# Patient Record
Sex: Female | Born: 1935 | Race: White | Hispanic: No | Smoking: Never smoker
Health system: Southern US, Community
[De-identification: ages and names within clinical notes are randomized; demographics above are authoritative.]

## PROBLEM LIST (undated history)

## (undated) DIAGNOSIS — I509 Heart failure, unspecified: Secondary | ICD-10-CM

## (undated) DIAGNOSIS — F32A Depression, unspecified: Secondary | ICD-10-CM

## (undated) DIAGNOSIS — J449 Chronic obstructive pulmonary disease, unspecified: Secondary | ICD-10-CM

## (undated) DIAGNOSIS — F329 Major depressive disorder, single episode, unspecified: Secondary | ICD-10-CM

## (undated) DIAGNOSIS — I10 Essential (primary) hypertension: Secondary | ICD-10-CM

## (undated) DIAGNOSIS — E119 Type 2 diabetes mellitus without complications: Secondary | ICD-10-CM

## (undated) DIAGNOSIS — M810 Age-related osteoporosis without current pathological fracture: Secondary | ICD-10-CM

## (undated) HISTORY — PX: CATARACT EXTRACTION: SUR2

## (undated) HISTORY — PX: US ECHOCARDIOGRAPHY: HXRAD669

---

## 1998-09-10 ENCOUNTER — Other Ambulatory Visit: Admission: RE | Admit: 1998-09-10 | Discharge: 1998-09-10 | Payer: Self-pay | Admitting: *Deleted

## 1999-05-09 ENCOUNTER — Encounter: Payer: Self-pay | Admitting: Vascular Surgery

## 1999-05-09 ENCOUNTER — Ambulatory Visit (HOSPITAL_COMMUNITY): Admission: RE | Admit: 1999-05-09 | Discharge: 1999-05-09 | Payer: Self-pay | Admitting: Vascular Surgery

## 1999-09-11 ENCOUNTER — Ambulatory Visit (HOSPITAL_BASED_OUTPATIENT_CLINIC_OR_DEPARTMENT_OTHER): Admission: RE | Admit: 1999-09-11 | Discharge: 1999-09-11 | Payer: Self-pay | Admitting: Orthopedic Surgery

## 1999-09-11 ENCOUNTER — Encounter (INDEPENDENT_AMBULATORY_CARE_PROVIDER_SITE_OTHER): Payer: Self-pay | Admitting: *Deleted

## 2000-12-09 ENCOUNTER — Other Ambulatory Visit: Admission: RE | Admit: 2000-12-09 | Discharge: 2000-12-09 | Payer: Self-pay | Admitting: *Deleted

## 2002-03-14 ENCOUNTER — Other Ambulatory Visit: Admission: RE | Admit: 2002-03-14 | Discharge: 2002-03-14 | Payer: Self-pay

## 2004-08-05 ENCOUNTER — Ambulatory Visit (HOSPITAL_COMMUNITY): Admission: RE | Admit: 2004-08-05 | Discharge: 2004-08-05 | Payer: Self-pay | Admitting: Gastroenterology

## 2004-10-02 ENCOUNTER — Other Ambulatory Visit: Admission: RE | Admit: 2004-10-02 | Discharge: 2004-10-02 | Payer: Self-pay | Admitting: Family Medicine

## 2008-10-13 ENCOUNTER — Encounter: Admission: RE | Admit: 2008-10-13 | Discharge: 2008-10-13 | Payer: Self-pay | Admitting: Family Medicine

## 2010-09-20 NOTE — Op Note (Signed)
[place]  Patient:    LORELL, THIBODAUX                       MRN: [id number] Proc. Date: 09/11/99 Adm. Date:  [id number] Disc. Date: [id number] Attending:  Schuyler Amor CC:         Sheral Apley Burney Gauze, M.D., [place] (2 copies)                           Operative Report  PREOPERATIVE DIAGNOSIS:  Occlusion of right renal artery.  POSTOPERATIVE DIAGNOSIS:  Occlusion of right renal artery.  OPERATION:  Resection of thrombosed portion of right renal artery with direct repair microscopically.  SURGEON:  Sheral Apley. Burney Gauze, M.D.  ASSISTANT:  Wynonia Sours, M.D.  ANESTHESIA:  General.  TOURNIQUET TIME:  One hour.  COMPLICATIONS:  None.  DRAINS:  None.  DESCRIPTION OF PROCEDURE:  The patient was taken to the operating room after the induction of adequate general anesthesia.  The right upper extremity was prepped and draped in the usual sterile fashion.  An Esmarch was used to exsanguinate the limb and the tourniquet was inflated to 250 mmHg.  At this time, a longitudinal incision was made starting 3 cm proximal to the distal forearm crease going along the topical border of the radial artery and then curving to the radial side up over the base of the thumb metacarpal into the area of the snuffbox.  The incision was taken down through the skin and subcutaneous tissues, and the radial artery was identified and the proximal part of the wound.  The radial artery was then traced underneath the first dorsal compartment into the area of the snuffbox where after careful dissection of the thrombosed area was seen just at the bifurcation of the radial artery proper and the ______ pollicis artery.  Careful dissection was taken out along the proximal part of the radial artery and dissection was performed.  The occluded part of the artery was then resected using microscopic scissors and the microscope was then brought onto the field. Under  microscopic magnification, the proximal and distal ends of the resected artery segments were dissected free of adventitia and intimal hypertrophy, and a vessel dilator was used to dilate the ends.  The proximal end had good back flow as well as the distal end.  A #2 Fogarty catheter was also placed within the proximal vessel, slightly inflated and withdrawn to fully dilate the vessel.  At this time a direct repair was undertaken microscopically using 9-0 nylon x 5 sutures.  After repair had been done, under no undo tension, the tourniquet was released and there was good flow across the anastomosed vessel site.  The wound was then thoroughly irrigated with ______ solution followed by normal saline and then closed with 4-0 nylon and a combination of simple and horizontal mattress sutures.  A sterile dressing, Xeroform, 4 x 4, Fluffs and a compressive dressing was applied as well as a radial gutter splint with the wrist in slight flexion.  The patient tolerated the procedure well, went to recovery room in a stable fashion. DD:  09/11/99 TD:  09/13/99 Job: [id number]

## 2010-09-20 NOTE — Op Note (Signed)
NAME:  Angel Estrada, Angel Estrada NO.:  [id number]   MEDICAL RECORD NO.:  [id number]          PATIENT TYPE:  AMB   LOCATION:  ENDO                         [facility]ACILITY[facility]  [facility]   PHYSICIAN:  John C. Amedeo Plenty, M.D.    DATE OF BIRTH:  Apr 21, 1934   DATE OF PROCEDURE:  08/04/2004  DATE OF DISCHARGE:                                 OPERATIVE REPORT   INDICATIONS FOR PROCEDURE:  Average risk colon cancer screening.   PROCEDURE:  The patient was placed in the left lateral decubitus position  and placed on the pulse monitor with continuous low-flow oxygen delivered by  nasal cannula. She was sedated with and 75 mcg IV fentanyl and 6 mg IV  Versed. The Olympus video colonoscope was inserted into the rectum and  advanced to cecum, confirmed by transillumination of McBurney's point and  visualization of ileocecal valve and appendiceal orifice. The prep was good.  The cecum, ascending and transverse colon all appeared normal with no  masses, polyps, diverticula or other mucosal abnormalities. Within the  descending and sigmoid colon, there was seen several scattered diverticula,  no other abnormalities. The rectum appeared normal. Retroflexed view of the  anus revealed no obvious internal hemorrhoids. The scope was then withdrawn  and the patient returned to the recovery room in stable condition. She  tolerated the procedure well. There were no immediate complications.   IMPRESSION:  Diverticulosis, otherwise normal study.   PLAN:  Next colonoscopy within 10 years.  Consider flexible sigmoidoscopy or  Hemoccults in 5 years.      [facility]/MEDQ  D:  08/05/2004  T:  08/05/2004  Job:  [id number]   cc:   Suszanne Conners, M.D.  [place]  [place] [address]  Fax: [phone]

## 2012-12-14 ENCOUNTER — Other Ambulatory Visit: Payer: Self-pay | Admitting: Dermatology

## 2013-05-27 ENCOUNTER — Telehealth (HOSPITAL_COMMUNITY): Payer: Self-pay | Admitting: *Deleted

## 2013-06-14 ENCOUNTER — Other Ambulatory Visit (HOSPITAL_COMMUNITY): Payer: Self-pay | Admitting: Internal Medicine

## 2013-06-14 DIAGNOSIS — R0602 Shortness of breath: Secondary | ICD-10-CM

## 2013-06-29 ENCOUNTER — Encounter (HOSPITAL_COMMUNITY): Payer: Self-pay

## 2013-07-13 ENCOUNTER — Ambulatory Visit (HOSPITAL_COMMUNITY)
Admission: RE | Admit: 2013-07-13 | Discharge: 2013-07-13 | Disposition: A | Discharge status: Home / Self Care | Payer: PRIVATE HEALTH INSURANCE | Source: Ambulatory Visit | Attending: Cardiovascular Disease | Admitting: Cardiovascular Disease

## 2013-07-13 DIAGNOSIS — R0602 Shortness of breath: Secondary | ICD-10-CM

## 2013-11-28 ENCOUNTER — Telehealth (HOSPITAL_COMMUNITY): Payer: Self-pay | Admitting: *Deleted

## 2013-12-21 ENCOUNTER — Telehealth (HOSPITAL_COMMUNITY): Payer: Self-pay | Admitting: *Deleted

## 2013-12-22 ENCOUNTER — Other Ambulatory Visit (HOSPITAL_COMMUNITY): Payer: Self-pay | Admitting: Internal Medicine

## 2013-12-22 DIAGNOSIS — I509 Heart failure, unspecified: Secondary | ICD-10-CM

## 2013-12-22 DIAGNOSIS — R609 Edema, unspecified: Secondary | ICD-10-CM

## 2013-12-23 ENCOUNTER — Ambulatory Visit (HOSPITAL_COMMUNITY)
Admission: RE | Admit: 2013-12-23 | Discharge: 2013-12-23 | Disposition: A | Discharge status: Home / Self Care | Payer: PRIVATE HEALTH INSURANCE | Source: Ambulatory Visit | Attending: Cardiovascular Disease | Admitting: Cardiovascular Disease

## 2013-12-23 DIAGNOSIS — I369 Nonrheumatic tricuspid valve disorder, unspecified: Secondary | ICD-10-CM

## 2013-12-23 DIAGNOSIS — I509 Heart failure, unspecified: Secondary | ICD-10-CM | POA: Insufficient documentation

## 2013-12-23 DIAGNOSIS — R609 Edema, unspecified: Secondary | ICD-10-CM

## 2013-12-23 NOTE — Progress Notes (Signed)
2D Echocardiogram Complete.  12/23/2013   Zamir Staples [place], RDCS

## 2015-07-30 ENCOUNTER — Inpatient Hospital Stay (HOSPITAL_COMMUNITY)
Admission: EM | Admit: 2015-07-30 | Discharge: 2015-08-04 | DRG: 602 | Disposition: A | Discharge status: Home / Self Care | Payer: Medicare Other | Attending: Internal Medicine | Admitting: Internal Medicine

## 2015-07-30 ENCOUNTER — Encounter (HOSPITAL_COMMUNITY): Payer: Self-pay | Admitting: Emergency Medicine

## 2015-07-30 ENCOUNTER — Emergency Department (HOSPITAL_COMMUNITY): Payer: Medicare Other

## 2015-07-30 DIAGNOSIS — Z880 Allergy status to penicillin: Secondary | ICD-10-CM

## 2015-07-30 DIAGNOSIS — I5033 Acute on chronic diastolic (congestive) heart failure: Secondary | ICD-10-CM | POA: Diagnosis present

## 2015-07-30 DIAGNOSIS — M7989 Other specified soft tissue disorders: Secondary | ICD-10-CM | POA: Diagnosis not present

## 2015-07-30 DIAGNOSIS — R600 Localized edema: Secondary | ICD-10-CM

## 2015-07-30 DIAGNOSIS — M810 Age-related osteoporosis without current pathological fracture: Secondary | ICD-10-CM | POA: Diagnosis present

## 2015-07-30 DIAGNOSIS — Z833 Family history of diabetes mellitus: Secondary | ICD-10-CM

## 2015-07-30 DIAGNOSIS — E119 Type 2 diabetes mellitus without complications: Secondary | ICD-10-CM

## 2015-07-30 DIAGNOSIS — I10 Essential (primary) hypertension: Secondary | ICD-10-CM | POA: Diagnosis present

## 2015-07-30 DIAGNOSIS — K449 Diaphragmatic hernia without obstruction or gangrene: Secondary | ICD-10-CM | POA: Diagnosis present

## 2015-07-30 DIAGNOSIS — Z8249 Family history of ischemic heart disease and other diseases of the circulatory system: Secondary | ICD-10-CM

## 2015-07-30 DIAGNOSIS — Z882 Allergy status to sulfonamides status: Secondary | ICD-10-CM

## 2015-07-30 DIAGNOSIS — I509 Heart failure, unspecified: Secondary | ICD-10-CM

## 2015-07-30 DIAGNOSIS — I130 Hypertensive heart and chronic kidney disease with heart failure and stage 1 through stage 4 chronic kidney disease, or unspecified chronic kidney disease: Secondary | ICD-10-CM | POA: Diagnosis present

## 2015-07-30 DIAGNOSIS — J449 Chronic obstructive pulmonary disease, unspecified: Secondary | ICD-10-CM | POA: Diagnosis present

## 2015-07-30 DIAGNOSIS — L03116 Cellulitis of left lower limb: Secondary | ICD-10-CM | POA: Diagnosis not present

## 2015-07-30 DIAGNOSIS — I5030 Unspecified diastolic (congestive) heart failure: Secondary | ICD-10-CM

## 2015-07-30 DIAGNOSIS — F329 Major depressive disorder, single episode, unspecified: Secondary | ICD-10-CM | POA: Diagnosis present

## 2015-07-30 DIAGNOSIS — Z808 Family history of malignant neoplasm of other organs or systems: Secondary | ICD-10-CM

## 2015-07-30 DIAGNOSIS — Z888 Allergy status to other drugs, medicaments and biological substances status: Secondary | ICD-10-CM

## 2015-07-30 DIAGNOSIS — T502X5A Adverse effect of carbonic-anhydrase inhibitors, benzothiadiazides and other diuretics, initial encounter: Secondary | ICD-10-CM | POA: Diagnosis present

## 2015-07-30 DIAGNOSIS — N183 Chronic kidney disease, stage 3 (moderate): Secondary | ICD-10-CM | POA: Diagnosis present

## 2015-07-30 DIAGNOSIS — Z79899 Other long term (current) drug therapy: Secondary | ICD-10-CM

## 2015-07-30 DIAGNOSIS — R944 Abnormal results of kidney function studies: Secondary | ICD-10-CM | POA: Diagnosis present

## 2015-07-30 DIAGNOSIS — Z823 Family history of stroke: Secondary | ICD-10-CM

## 2015-07-30 DIAGNOSIS — I248 Other forms of acute ischemic heart disease: Secondary | ICD-10-CM | POA: Diagnosis present

## 2015-07-30 DIAGNOSIS — E1122 Type 2 diabetes mellitus with diabetic chronic kidney disease: Secondary | ICD-10-CM | POA: Diagnosis present

## 2015-07-30 DIAGNOSIS — Z91040 Latex allergy status: Secondary | ICD-10-CM

## 2015-07-30 HISTORY — DX: Chronic obstructive pulmonary disease, unspecified: J44.9

## 2015-07-30 HISTORY — DX: Depression, unspecified: F32.A

## 2015-07-30 HISTORY — DX: Heart failure, unspecified: I50.9

## 2015-07-30 HISTORY — DX: Major depressive disorder, single episode, unspecified: F32.9

## 2015-07-30 HISTORY — DX: Type 2 diabetes mellitus without complications: E11.9

## 2015-07-30 HISTORY — DX: Essential (primary) hypertension: I10

## 2015-07-30 HISTORY — DX: Age-related osteoporosis without current pathological fracture: M81.0

## 2015-07-30 LAB — CBC WITH DIFFERENTIAL/PLATELET
Basophils Absolute: 0 10*3/uL (ref 0.0–0.1)
Basophils Relative: 0 %
Eosinophils Absolute: 0.1 10*3/uL (ref 0.0–0.7)
Eosinophils Relative: 1 %
HEMATOCRIT: 46.8 % — AB (ref 36.0–46.0)
HEMOGLOBIN: 15 g/dL (ref 12.0–15.0)
Lymphocytes Relative: 11 %
Lymphs Abs: 1 10*3/uL (ref 0.7–4.0)
MCH: 30.6 pg (ref 26.0–34.0)
MCHC: 32.1 g/dL (ref 30.0–36.0)
MCV: 95.5 fL (ref 78.0–100.0)
MONOS PCT: 12 %
Monocytes Absolute: 1.1 10*3/uL — ABNORMAL HIGH (ref 0.1–1.0)
NEUTROS ABS: 6.9 10*3/uL (ref 1.7–7.7)
NEUTROS PCT: 76 %
Platelets: 276 10*3/uL (ref 150–400)
RBC: 4.9 MIL/uL (ref 3.87–5.11)
RDW: 14.4 % (ref 11.5–15.5)
WBC: 9.1 10*3/uL (ref 4.0–10.5)

## 2015-07-30 LAB — COMPREHENSIVE METABOLIC PANEL
ALBUMIN: 3.8 g/dL (ref 3.5–5.0)
ALT: 23 U/L (ref 14–54)
AST: 31 U/L (ref 15–41)
Alkaline Phosphatase: 86 U/L (ref 38–126)
Anion gap: 14 (ref 5–15)
BUN: 41 mg/dL — ABNORMAL HIGH (ref 6–20)
CO2: 32 mmol/L (ref 22–32)
Calcium: 9.3 mg/dL (ref 8.9–10.3)
Chloride: 94 mmol/L — ABNORMAL LOW (ref 101–111)
Creatinine, Ser: 1.47 mg/dL — ABNORMAL HIGH (ref 0.44–1.00)
GFR calc Af Amer: 38 mL/min — ABNORMAL LOW (ref >60)
GFR calc non Af Amer: 33 mL/min — ABNORMAL LOW (ref >60)
GLUCOSE: 94 mg/dL (ref 65–99)
POTASSIUM: 3.7 mmol/L (ref 3.5–5.1)
SODIUM: 140 mmol/L (ref 135–145)
TOTAL PROTEIN: 7 g/dL (ref 6.5–8.1)
Total Bilirubin: 1.5 mg/dL — ABNORMAL HIGH (ref 0.3–1.2)

## 2015-07-30 LAB — I-STAT CG4 LACTIC ACID, ED
Lactic Acid, Venous: 1.24 mmol/L (ref 0.5–2.0)
Lactic Acid, Venous: 0.88 mmol/L (ref 0.5–2.0)

## 2015-07-30 LAB — PROTIME-INR
INR: 1.19 (ref 0.00–1.49)
Prothrombin Time: 14.8 seconds (ref 11.6–15.2)

## 2015-07-30 LAB — APTT: aPTT: 27 seconds (ref 24–37)

## 2015-07-30 LAB — BRAIN NATRIURETIC PEPTIDE: B Natriuretic Peptide: 764.5 pg/mL — ABNORMAL HIGH (ref 0.0–100.0)

## 2015-07-30 MED ORDER — FUROSEMIDE 10 MG/ML IJ SOLN
60.0000 mg | Freq: Once | INTRAMUSCULAR | Status: AC
  Administered 2015-07-31: 60 mg via INTRAVENOUS
  Filled 2015-07-30: qty 8

## 2015-07-30 NOTE — ED Provider Notes (Signed)
CSN: [id number]     Arrival date & time 07/30/15  1909 History   First MD Initiated Contact with Patient 07/30/15 2010     Chief Complaint  Patient presents with  . Cellulitis     (Consider location/radiation/quality/duration/timing/severity/associated sxs/prior Treatment) HPI Comments: Patient here after being seen by her physician today for pain to her bilateral lower extremities and diagnosed with cellulitis. She however denied any fever or chills or myalgias. Does have a history of recurrent cellulitis. She is notes slight dyspnea but denies any chest pain or cough. Does not use home oxygen. Pulse oximetry was 80% which was treated with 2 L of oxygen. That was performed by EMS. She has also noted some numbness to the left foot as well as discoloration. Symptoms have been present for over 24 hours and no treatment for them use prior to arrival. Denies any prior history of arterial obstruction  The history is provided by the patient.    Past Medical History  Diagnosis Date  . CHF (congestive heart failure) ([place])   . Diabetes mellitus without complication ([place])   . Hypertension   . COPD (chronic obstructive pulmonary disease) ([place])   . Depression   . Osteoporosis    Past Surgical History  Procedure Laterality Date  . US echocardiography    . Cataract extraction Bilateral    No family history on file. Social History  Substance Use Topics  . Smoking status: Never Smoker   . Smokeless tobacco: None  . Alcohol Use: No   OB History    No data available     Review of Systems  All other systems reviewed and are negative.     Allergies  Lisinopril-hydrochlorothiazide; Sulfa antibiotics; Latex; and Penicillins  Home Medications   Prior to Admission medications   Medication Sig Start Date End Date Taking? Authorizing Provider  citalopram (CELEXA) 40 MG tablet Take 40 mg by mouth daily. 07/13/15  Yes Historical Provider, MD  fluticasone (FLONASE) 50 MCG/ACT nasal spray  Place 2 sprays into both nostrils daily.   Yes Historical Provider, MD  furosemide (LASIX) 40 MG tablet Take 40-60 mg by mouth daily. 60 mg every morning and 40 mg every afternoon 07/09/15  Yes Historical Provider, MD  nystatin-triamcinolone (MYCOLOG II) cream Apply 1 application topically 2 (two) times daily as needed (rash). Reported on 07/30/2015 07/02/15  Yes Historical Provider, MD  PROAIR HFA 108 (90 Base) MCG/ACT inhaler Inhale 2 puffs into the lungs every 6 (six) hours as needed for wheezing or shortness of breath.  07/02/15  Yes Historical Provider, MD  triamcinolone cream (KENALOG) 0.1 % Apply 1 application topically 2 (two) times daily as needed (skin irritation).   Yes Historical Provider, MD   BP 122/89 mmHg  Pulse 93  Temp(Src) 98.4 F (36.9 C) (Oral)  Resp 15  SpO2 96% Physical Exam  Constitutional: She is oriented to person, place, and time. She appears well-developed and well-nourished.  Non-toxic appearance. No distress.  HENT:  Head: Normocephalic and atraumatic.  Eyes: Conjunctivae, EOM and lids are normal. Pupils are equal, round, and reactive to light.  Neck: Normal range of motion. Neck supple. No tracheal deviation present. No thyroid mass present.  Cardiovascular: Normal rate, regular rhythm and normal heart sounds.  Exam reveals no gallop.   No murmur heard. Pulmonary/Chest: Effort normal. No stridor. No respiratory distress. She has decreased breath sounds. She has rhonchi. She has no rales.  Abdominal: Soft. Normal appearance and bowel sounds are normal. She exhibits no  distension. There is no tenderness. There is no rebound and no CVA tenderness.  Musculoskeletal: Normal range of motion. She exhibits no edema or tenderness.       Feet:  Bilateral lower extremity discoloration with hyperpigmentation. Right lower extremity with 3+ pitting edema compared with the left which has 1+ pitting edema. Cap refill less than 3 seconds on the left foot. Slight paresthesias per  patient at the sole of the left foot but has normal function of both feet.  Neurological: She is alert and oriented to person, place, and time. She has normal strength. No cranial nerve deficit or sensory deficit. GCS eye subscore is 4. GCS verbal subscore is 5. GCS motor subscore is 6.  Skin: Skin is warm and dry. No abrasion and no rash noted.  Psychiatric: She has a normal mood and affect. Her speech is normal and behavior is normal.  Nursing note and vitals reviewed.   ED Course  Procedures (including critical care time) Labs Review Labs Reviewed  CULTURE, BLOOD (ROUTINE X 2)  CULTURE, BLOOD (ROUTINE X 2)  URINE CULTURE  COMPREHENSIVE METABOLIC PANEL  CBC WITH DIFFERENTIAL/PLATELET  URINALYSIS, ROUTINE W REFLEX MICROSCOPIC (NOT AT [facility])  PROTIME-INR  APTT  BRAIN NATRIURETIC PEPTIDE  I-STAT CG4 LACTIC ACID, ED    Imaging Review Dg Chest 2 View  07/30/2015  CLINICAL DATA:  82 year old female with hypoxia. Cellulitis of the lower extremities. EXAM: CHEST  2 VIEW COMPARISON:  None. FINDINGS: Two views of the chest demonstrate large focal area of opacity at the left lung base which appears to contain bowel gas and most likely represents large hiatal or diaphragmatic hernia. There is associated compressive atelectasis of the left lung base. Pneumonia is not excluded. The right lung is clear. There is emphysematous changes of the spine. Trace right pleural effusion noted. There is silhouetting of the left cardiac border. There is thoracolumbar scoliosis. No acute fracture. IMPRESSION: Large bowel containing hernia in the left lung base. Underlying infiltrate is not excluded. Electronically Signed   By: Anner Crete M.D.   On: 07/30/2015 20:32   I have personally reviewed and evaluated these images and lab results as part of my medical decision-making.   EKG Interpretation   Date/Time:  Monday July 30 2015 19:40:29 EDT Ventricular Rate:  100 PR Interval:  193 QRS Duration: 97 QT  Interval:  346 QTC Calculation: 446 R Axis:   -155 Text Interpretation:  Sinus tachycardia Multiple premature complexes, vent  & supraven Low voltage, extremity and precordial leads Consider right  ventricular hypertrophy Baseline wander in lead(s) V5 Confirmed by Zareena Willis   MD, Thersea Manfredonia ([id number]) on 07/30/2015 8:37:00 PM      MDM   Final diagnoses:  None    Patient given Lasix for suspected CHF. Will Be admitted to the hospitalist service      Lacretia Leigh, MD 07/30/15 2355

## 2015-07-30 NOTE — ED Notes (Signed)
Pt transported via EMS from [facility] for IV antibiotics to treat bilat cellulitis of lower extremities. Pt initial sat 80% on RA, pt was ambulatory from ambulance to room, pt states she is not one home 02 and has been increasingly SHOB, especially with exertion.

## 2015-07-31 ENCOUNTER — Inpatient Hospital Stay (HOSPITAL_COMMUNITY): Payer: Medicare Other

## 2015-07-31 ENCOUNTER — Encounter (HOSPITAL_COMMUNITY): Payer: Self-pay | Admitting: Internal Medicine

## 2015-07-31 DIAGNOSIS — M810 Age-related osteoporosis without current pathological fracture: Secondary | ICD-10-CM | POA: Diagnosis present

## 2015-07-31 DIAGNOSIS — Z8249 Family history of ischemic heart disease and other diseases of the circulatory system: Secondary | ICD-10-CM | POA: Diagnosis not present

## 2015-07-31 DIAGNOSIS — L03116 Cellulitis of left lower limb: Secondary | ICD-10-CM | POA: Diagnosis present

## 2015-07-31 DIAGNOSIS — Z808 Family history of malignant neoplasm of other organs or systems: Secondary | ICD-10-CM | POA: Diagnosis not present

## 2015-07-31 DIAGNOSIS — Z882 Allergy status to sulfonamides status: Secondary | ICD-10-CM | POA: Diagnosis not present

## 2015-07-31 DIAGNOSIS — F329 Major depressive disorder, single episode, unspecified: Secondary | ICD-10-CM | POA: Diagnosis present

## 2015-07-31 DIAGNOSIS — I509 Heart failure, unspecified: Secondary | ICD-10-CM

## 2015-07-31 DIAGNOSIS — L03115 Cellulitis of right lower limb: Secondary | ICD-10-CM | POA: Diagnosis not present

## 2015-07-31 DIAGNOSIS — R944 Abnormal results of kidney function studies: Secondary | ICD-10-CM | POA: Diagnosis present

## 2015-07-31 DIAGNOSIS — T502X5A Adverse effect of carbonic-anhydrase inhibitors, benzothiadiazides and other diuretics, initial encounter: Secondary | ICD-10-CM | POA: Diagnosis present

## 2015-07-31 DIAGNOSIS — Z79899 Other long term (current) drug therapy: Secondary | ICD-10-CM | POA: Diagnosis not present

## 2015-07-31 DIAGNOSIS — M7989 Other specified soft tissue disorders: Secondary | ICD-10-CM | POA: Diagnosis present

## 2015-07-31 DIAGNOSIS — Z833 Family history of diabetes mellitus: Secondary | ICD-10-CM | POA: Diagnosis not present

## 2015-07-31 DIAGNOSIS — Z880 Allergy status to penicillin: Secondary | ICD-10-CM | POA: Diagnosis not present

## 2015-07-31 DIAGNOSIS — I10 Essential (primary) hypertension: Secondary | ICD-10-CM | POA: Diagnosis not present

## 2015-07-31 DIAGNOSIS — Z888 Allergy status to other drugs, medicaments and biological substances status: Secondary | ICD-10-CM | POA: Diagnosis not present

## 2015-07-31 DIAGNOSIS — Z823 Family history of stroke: Secondary | ICD-10-CM | POA: Diagnosis not present

## 2015-07-31 DIAGNOSIS — K449 Diaphragmatic hernia without obstruction or gangrene: Secondary | ICD-10-CM | POA: Diagnosis present

## 2015-07-31 DIAGNOSIS — I248 Other forms of acute ischemic heart disease: Secondary | ICD-10-CM | POA: Diagnosis present

## 2015-07-31 DIAGNOSIS — R600 Localized edema: Secondary | ICD-10-CM

## 2015-07-31 DIAGNOSIS — I5033 Acute on chronic diastolic (congestive) heart failure: Secondary | ICD-10-CM | POA: Diagnosis present

## 2015-07-31 DIAGNOSIS — I130 Hypertensive heart and chronic kidney disease with heart failure and stage 1 through stage 4 chronic kidney disease, or unspecified chronic kidney disease: Secondary | ICD-10-CM | POA: Diagnosis present

## 2015-07-31 DIAGNOSIS — N183 Chronic kidney disease, stage 3 (moderate): Secondary | ICD-10-CM | POA: Diagnosis present

## 2015-07-31 DIAGNOSIS — J42 Unspecified chronic bronchitis: Secondary | ICD-10-CM | POA: Diagnosis not present

## 2015-07-31 DIAGNOSIS — E1122 Type 2 diabetes mellitus with diabetic chronic kidney disease: Secondary | ICD-10-CM | POA: Diagnosis present

## 2015-07-31 DIAGNOSIS — E119 Type 2 diabetes mellitus without complications: Secondary | ICD-10-CM | POA: Diagnosis not present

## 2015-07-31 DIAGNOSIS — Z91040 Latex allergy status: Secondary | ICD-10-CM | POA: Diagnosis not present

## 2015-07-31 DIAGNOSIS — J449 Chronic obstructive pulmonary disease, unspecified: Secondary | ICD-10-CM | POA: Diagnosis present

## 2015-07-31 LAB — URINALYSIS, ROUTINE W REFLEX MICROSCOPIC
Bilirubin Urine: NEGATIVE
Glucose, UA: NEGATIVE mg/dL
HGB URINE DIPSTICK: NEGATIVE
Ketones, ur: NEGATIVE mg/dL
Nitrite: NEGATIVE
PH: 7 (ref 5.0–8.0)
Protein, ur: NEGATIVE mg/dL
Specific Gravity, Urine: 1.009 (ref 1.005–1.030)

## 2015-07-31 LAB — ECHOCARDIOGRAM COMPLETE
HEIGHTINCHES: 64 in
WEIGHTICAEL: 1956.8 [oz_av]

## 2015-07-31 LAB — TROPONIN I
TROPONIN I: 0.04 ng/mL — AB (ref <0.031)
Troponin I: 0.04 ng/mL — ABNORMAL HIGH (ref <0.031)
Troponin I: 0.04 ng/mL — ABNORMAL HIGH (ref <0.031)
Troponin I: 0.04 ng/mL — ABNORMAL HIGH (ref <0.031)

## 2015-07-31 LAB — URINE MICROSCOPIC-ADD ON: RBC / HPF: NONE SEEN RBC/hpf (ref 0–5)

## 2015-07-31 MED ORDER — ACETAMINOPHEN 325 MG PO TABS: 650.0000 mg | ORAL_TABLET | ORAL | Status: DC | PRN

## 2015-07-31 MED ORDER — SODIUM CHLORIDE 0.9% FLUSH
3.0000 mL | Freq: Two times a day (BID) | INTRAVENOUS | Status: DC
  Administered 2015-07-31 – 2015-08-03 (×8): 3 mL via INTRAVENOUS

## 2015-07-31 MED ORDER — IPRATROPIUM-ALBUTEROL 0.5-2.5 (3) MG/3ML IN SOLN
3.0000 mL | Freq: Three times a day (TID) | RESPIRATORY_TRACT | Status: DC
  Administered 2015-07-31 – 2015-08-01 (×4): 3 mL via RESPIRATORY_TRACT
  Filled 2015-07-31 (×4): qty 3

## 2015-07-31 MED ORDER — ASPIRIN EC 81 MG PO TBEC
81.0000 mg | DELAYED_RELEASE_TABLET | Freq: Every day | ORAL | Status: DC
  Administered 2015-07-31 – 2015-08-04 (×5): 81 mg via ORAL
  Filled 2015-07-31 (×5): qty 1

## 2015-07-31 MED ORDER — IPRATROPIUM BROMIDE 0.02 % IN SOLN
0.5000 mg | Freq: Four times a day (QID) | RESPIRATORY_TRACT | Status: DC
  Administered 2015-07-31: 0.5 mg via RESPIRATORY_TRACT
  Filled 2015-07-31: qty 2.5

## 2015-07-31 MED ORDER — POTASSIUM CHLORIDE CRYS ER 20 MEQ PO TBCR
20.0000 meq | EXTENDED_RELEASE_TABLET | Freq: Two times a day (BID) | ORAL | Status: DC
  Administered 2015-07-31 – 2015-08-02 (×7): 20 meq via ORAL
  Filled 2015-07-31 (×8): qty 1

## 2015-07-31 MED ORDER — ALBUTEROL SULFATE (2.5 MG/3ML) 0.083% IN NEBU: 2.5000 mg | INHALATION_SOLUTION | Freq: Four times a day (QID) | RESPIRATORY_TRACT | Status: DC | PRN

## 2015-07-31 MED ORDER — ENOXAPARIN SODIUM 30 MG/0.3ML ~~LOC~~ SOLN
30.0000 mg | SUBCUTANEOUS | Status: DC
  Administered 2015-07-31 – 2015-08-01 (×2): 30 mg via SUBCUTANEOUS
  Filled 2015-07-31 (×3): qty 0.3

## 2015-07-31 MED ORDER — ONDANSETRON HCL 4 MG/2ML IJ SOLN: 4.0000 mg | Freq: Four times a day (QID) | INTRAMUSCULAR | Status: DC | PRN

## 2015-07-31 MED ORDER — ALBUTEROL SULFATE HFA 108 (90 BASE) MCG/ACT IN AERS: 2.0000 | INHALATION_SPRAY | Freq: Four times a day (QID) | RESPIRATORY_TRACT | Status: DC | PRN

## 2015-07-31 MED ORDER — FUROSEMIDE 10 MG/ML IJ SOLN
40.0000 mg | Freq: Two times a day (BID) | INTRAMUSCULAR | Status: DC
  Administered 2015-07-31 – 2015-08-03 (×7): 40 mg via INTRAVENOUS
  Filled 2015-07-31 (×7): qty 4

## 2015-07-31 MED ORDER — SODIUM CHLORIDE 0.9% FLUSH: 3.0000 mL | INTRAVENOUS | Status: DC | PRN

## 2015-07-31 MED ORDER — CITALOPRAM HYDROBROMIDE 40 MG PO TABS
40.0000 mg | ORAL_TABLET | Freq: Every day | ORAL | Status: DC
  Administered 2015-07-31 – 2015-08-04 (×5): 40 mg via ORAL
  Filled 2015-07-31 (×5): qty 1

## 2015-07-31 MED ORDER — ALBUTEROL SULFATE (2.5 MG/3ML) 0.083% IN NEBU: 2.5000 mg | INHALATION_SOLUTION | RESPIRATORY_TRACT | Status: DC | PRN

## 2015-07-31 MED ORDER — VANCOMYCIN HCL 500 MG IV SOLR
500.0000 mg | Freq: Every day | INTRAVENOUS | Status: DC
  Administered 2015-07-31 – 2015-08-02 (×4): 500 mg via INTRAVENOUS
  Filled 2015-07-31 (×4): qty 500

## 2015-07-31 MED ORDER — SODIUM CHLORIDE 0.9 % IV SOLN: 250.0000 mL | INTRAVENOUS | Status: DC | PRN

## 2015-07-31 NOTE — Progress Notes (Signed)
  Echocardiogram 2D Echocardiogram has been performed.  Angel Estrada 07/31/2015, 1:10 PM

## 2015-07-31 NOTE — Care Management Note (Signed)
Case Management Note  Patient Details  Name: Angel Estrada MRN: [id number] Date of Birth: Jul 06, 1933  Subjective/Objective:   82 y/o f admitted w/CHF, bilat lower leg cellulitis. From home.                 Action/Plan:d/c plan home.   Expected Discharge Date:                  Expected Discharge Plan:  Home/Self Care  In-House Referral:     Discharge planning Services  CM Consult  Post Acute Care Choice:    Choice offered to:     DME Arranged:    DME Agency:     HH Arranged:    HH Agency:     Status of Service:  In process, will continue to follow  Medicare Important Message Given:    Date Medicare IM Given:    Medicare IM give by:    Date Additional Medicare IM Given:    Additional Medicare Important Message give by:     If discussed at [place] of Stay Meetings, dates discussed:    Additional Comments:  Dessa Phi, RN 07/31/2015, 3:05 PM

## 2015-07-31 NOTE — Progress Notes (Signed)
PROGRESS NOTE    Angel Estrada  FMB:[id number]  DOB: 12/11/1933  DOA: 07/30/2015 PCP: Thressa Sheller, MD Outpatient Specialists: Cardiology:   Hospital course: 82 year old female patient with history of chronic diastolic CHF, DM 2, HTN, COPD, depression, sent from PCPs office to ED due to complaints of worsening leg swelling left >right associated with worsening redness of left leg, some increased dyspnea but no chest pain and weeping left leg edema. Reports weight gain of 10 pounds recently. Admitted for presumed cellulitis of left leg and decompensated CHF.   Assessment & Plan:   Cellulitis of left leg - Likely complicating chronic leg edema/? Venous stasis - Treating empirically with IV vancomycin. Improving. Continue same for additional 24 hours then consider transitioning to oral antibiotics. - Bilateral lower extremity venous Dopplers: No DVT.  Acute on chronic diastolic CHF - Diuresed with IV Lasix. -1.2 L since admission. Improving. Continue management. - Minimally elevated troponin and flat trend, likely secondary to demand ischemia from CHF and infection. No chest pain reported. - Follow repeat 2-D echo.  COPD - Stable.  Essential hypertension - Controlled. Apparently allergic to ACEI  Diet-controlled DM 2 - Monitor CBGs.  Renal insufficiency/possible stage III chronic kidney disease - Baseline creatinine not known. Admitted with creatinine of 1.47. Follow BMP in a.m.  Large hiatal hernia or diaphragmatic hernia - Noted on chest x-ray. Relatively asymptomatic.    DVT prophylaxis: Lovenox Code Status: Full Family Communication: None at bedside Disposition Plan: DC home when medically stable, possibly in 1-2 days.   Consultants:  None  Procedures:  Bilateral lower extremity venous duplex completed.   Bilateral: No evidence of DVT, superficial thrombosis, or Baker's Cyst.  Antimicrobials:  None   Subjective: Feels much better. Improved  left leg swelling and redness. Not weeping anymore. Dyspnea improved. Denies dyspnea, cough or chest pain.  Objective: Filed Vitals:   07/31/15 0249 07/31/15 0525 07/31/15 1359 07/31/15 1403  BP: 137/87 113/74  104/76  Pulse: 101 94  92  Temp: 97.8 F (36.6 C) 98.1 F (36.7 C)  97.2 F (36.2 C)  TempSrc: Oral Oral  Oral  Resp: _0 Height: _1  (1.626 m)     Weight: 55.475 kg (122 lb 4.8 oz) 55.475 kg (122 lb 4.8 oz)    SpO2: 99% 96% 98% 100%    Intake/Output Summary (Last 24 hours) at 07/31/15 1503 Last data filed at 07/31/15 0840  Gross per 24 hour  Intake      0 ml  Output   1200 ml  Net  -1200 ml   Filed Weights   07/31/15 0249 07/31/15 0525  Weight: 55.475 kg (122 lb 4.8 oz) 55.475 kg (122 lb 4.8 oz)    Exam:  General exam: Small built and nourished pleasant elderly female sitting up comfortably in the chair this morning. Respiratory system: Occasional basal crackles but otherwise clear to auscultation. No increased work of breathing. Cardiovascular system: S1 & S2 heard, RRR. No JVD, murmurs, gallops, clicks. Trace bilateral leg edema, left greater than signed right. Telemetry: SR-ST in the 100s. Gastrointestinal system: Abdomen is nondistended, soft and nontender. Normal bowel sounds heard. Central nervous system: Alert and oriented. No focal neurological deficits. Extremities: Symmetric 5 x 5 power. Left lower extremity below mid leg up to the toes with diffuse erythema, minimal increased warmth but no tenderness or weeping and trace edema-looks improved compared to initial picture taken on admission. Right leg with patchy redness which may be chronic.   Data  Reviewed: Basic Metabolic Panel:  Recent Labs Lab 07/30/15 2116  NA 140  K 3.7  CL 94*  CO2 32  GLUCOSE 94  BUN 41*  CREATININE 1.47*  CALCIUM 9.3   Liver Function Tests:  Recent Labs Lab 07/30/15 2116  AST 31  ALT 23  ALKPHOS 86  BILITOT 1.5*  PROT 7.0  ALBUMIN 3.8   No results  for input(s): LIPASE, AMYLASE in the last 168 hours. No results for input(s): AMMONIA in the last 168 hours. CBC:  Recent Labs Lab 07/30/15 2200  WBC 9.1  NEUTROABS 6.9  HGB 15.0  HCT 46.8*  MCV 95.5  PLT 276   Cardiac Enzymes:  Recent Labs Lab 07/30/15 2314 07/31/15 0302 07/31/15 0834  TROPONINI 0.04* 0.04* 0.04*   BNP (last 3 results) No results for input(s): PROBNP in the last 8760 hours. CBG: No results for input(s): GLUCAP in the last 168 hours.  No results found for this or any previous visit (from the past 240 hour(s)).       Studies: Dg Chest 2 View  07/30/2015  CLINICAL DATA:  82 year old female with hypoxia. Cellulitis of the lower extremities. EXAM: CHEST  2 VIEW COMPARISON:  None. FINDINGS: Two views of the chest demonstrate large focal area of opacity at the left lung base which appears to contain bowel gas and most likely represents large hiatal or diaphragmatic hernia. There is associated compressive atelectasis of the left lung base. Pneumonia is not excluded. The right lung is clear. There is emphysematous changes of the spine. Trace right pleural effusion noted. There is silhouetting of the left cardiac border. There is thoracolumbar scoliosis. No acute fracture. IMPRESSION: Large bowel containing hernia in the left lung base. Underlying infiltrate is not excluded. Electronically Signed   By: Anner Crete M.D.   On: 07/30/2015 20:32        Scheduled Meds: . aspirin EC  81 mg Oral Daily  . citalopram  40 mg Oral Daily  . enoxaparin (LOVENOX) injection  30 mg Subcutaneous Q24H  . furosemide  40 mg Intravenous BID  . ipratropium  0.5 mg Nebulization Q6H  . potassium chloride  20 mEq Oral BID  . sodium chloride flush  3 mL Intravenous Q12H  . vancomycin  500 mg Intravenous QHS   Continuous Infusions:   Active Problems:   Hypertension   COPD (chronic obstructive pulmonary disease) (HCC)   CHF (congestive heart failure) (HCC)   Diabetes  mellitus (HCC)   Acute on chronic diastolic CHF (congestive heart failure), NYHA class 1 (HCC)   CHF exacerbation (H[facility])    Time spent: 25 minutes.    Vernell Leep, MD, FACP, FHM. [facility] Pager [phone]  If 7PM-7AM, please contact night-coverage [email or web address] Password [email or web address] 07/31/2015, 3:03 PM    LOS: 0 days

## 2015-07-31 NOTE — Progress Notes (Signed)
Occupational Therapy Evaluation Patient Details Name: Angel Estrada MRN: [id number] DOB: 1933/07/08 Today's Date: 07/31/2015    History of Present Illness Angel Estrada is a 82 y.o. female adm with bil LE redness and swelling;   doppler negative for DVTpast medical history of CHF,  Diabetes mellitus without complication, Hypertension; COPD, Depression, and Osteoporosis.    Clinical Impression   Patient admitted with above diagnosis. Pt currently with functional limitations due to the deficits listed below. Pt will benefit from skilled OT to increase ADL independence and safety to allow discharge to the venue listed below.  Patient placed on bed alarm due to very unsteady gait with history of falls.  Patient does not use O2 at home, after ambulation on room air O2 sats 76%; replaced O2 at 2.5L and pt returned to >91% after about 3 minutes. RN made aware.    Follow Up Recommendations  No OT follow up;Supervision/Assistance - 24 hour    Equipment Recommendations  Other (comment) (recommend shower chair but patient will likely refuse)    Recommendations for Other Services       Precautions / Restrictions Precautions Precautions: Fall Precaution Comments: O2 (sats dropped to 76% on eval)      Mobility Bed Mobility Overal bed mobility: Modified Independent Bed Mobility: Supine to Sit     Supine to sit: Modified independent (Device/Increase time)        Transfers Overall transfer level: Needs assistance Equipment used: None Transfers: Sit to/from Stand Sit to Stand: Min guard;Min assist         General transfer comment: for balance and safety; pt is unsteady in standing    Balance Overall balance assessment: Needs assistance;History of Falls                                       ADL Overall ADL's : Needs assistance/impaired Eating/Feeding: Independent   Grooming: Wash/dry hands;Supervision/safety;Standing   Upper Body Bathing: Set  up;Sitting   Lower Body Bathing: Minimal assistance;Sit to/from stand           Toilet Transfer: Min guard;Ambulation;Comfort height toilet   Toileting- Clothing Manipulation and Hygiene: Supervision/safety;Sit to/from stand       Functional mobility during ADLs: Min guard;Minimal assistance General ADL Comments: Patient is somewhat unsteady, especially during ambulation. O2 sats dropped to 76% on room air; on 2.5L O2, pt eventually back up >91% after about 3 minutes. Attempted to encourage use of assistive device but patient refused.      Vision     Perception     Praxis      Pertinent Vitals/Pain Pain Assessment: No/denies pain     Hand Dominance     Extremity/Trunk Assessment Upper Extremity Assessment Upper Extremity Assessment: Overall WFL for tasks assessed   Lower Extremity Assessment Lower Extremity Assessment: Defer to PT evaluation    Cervical / Trunk Assessment Cervical / Trunk Assessment: Kyphotic   Communication Communication Communication: No difficulties   Cognition Arousal/Alertness: Awake/alert Behavior During Therapy: WFL for tasks assessed/performed Overall Cognitive Status: Within Functional Limits for tasks assessed                     General Comments       Exercises       Shoulder Instructions      Home Living Family/patient expects to be discharged to:: Private residence Living Arrangements: Spouse/significant other Available Help at Discharge: Family  Type of Home: House Home Access: Stairs to enter [employer] of Steps: 2 Entrance Stairs-Rails: None Home Layout: Two level;Bed/bath upstairs Alternate Level Stairs-Number of Steps: 12 Alternate Level Stairs-Rails: Right Bathroom Shower/Tub: Teacher, Angel Estrada years/pre: Handicapped height     Home Equipment: Cane - single point          Prior Functioning/Environment Level of Independence: Independent        Comments: has a cane but  prefers to furniture walk    OT Diagnosis: Generalized weakness   OT Problem List: Decreased strength;Decreased activity tolerance;Impaired balance (sitting and/or standing);Decreased safety awareness;Decreased knowledge of use of DME or AE   OT Treatment/Interventions: Self-care/ADL training;Energy conservation;DME and/or AE instruction;Therapeutic activities;Patient/family education    OT Goals(Current goals can be found in the care plan section) Acute Rehab OT Goals Patient Stated Goal: to go home  OT Goal Formulation: With patient Time For Goal Achievement: 08/14/15 Potential to Achieve Goals: Good  OT Frequency: Min 2X/week   Barriers to D/C:            Co-evaluation PT/OT/SLP Co-Evaluation/Treatment: Yes Reason for Co-Treatment: For patient/therapist safety PT goals addressed during session: Mobility/safety with mobility OT goals addressed during session: ADL's and self-care      End of Session Equipment Utilized During Treatment: Gait belt Nurse Communication: Mobility status  Activity Tolerance: Patient tolerated treatment well Patient left: in bed;with call bell/phone within reach;with chair alarm set;with family/visitor present   Time: 2094-7096 OT Time Calculation (min): 19 min Charges:  OT General Charges $OT Visit: 1 Procedure OT Evaluation $OT Eval Moderate Complexity: 1 Procedure G-Codes:    Angel Estrada A 08/06/15, 12:28 PM

## 2015-07-31 NOTE — Progress Notes (Signed)
VASCULAR LAB PRELIMINARY  PRELIMINARY  PRELIMINARY  PRELIMINARY  Bilateral lower extremity venous duplex completed.    Bilateral:  No evidence of DVT, superficial thrombosis, or Baker's Cyst.   Janifer Adie, RVT, RDMS 07/31/2015, 10:35 AM

## 2015-07-31 NOTE — Evaluation (Signed)
Physical Therapy Evaluation Patient Details Name: Angel Estrada MRN: [id number] DOB: 08-02-1933 Today's Date: 07/31/2015   History of Present Illness  Angel Estrada is a 82 y.o. female adm with bil LE redness and swelling;   doppler negative for DVTpast medical history of CHF,  Diabetes mellitus without complication, Hypertension; COPD, Depression, and Osteoporosis.   Clinical Impression  Pt admitted with above diagnosis. Pt currently with functional limitations due to the deficits listed below (see PT Problem List). Pt will benefit from skilled PT to increase their independence and safety with mobility to allow discharge to the venue listed below. Pt placed on bed alarm d/t pt very unsteady gait, hx of falls;  Pt does not use O2 at home, currently on  1.5L, O2 removed for amb, sats checked after amb in hallway -->SpO2=76% on RA, replaced  O2 at  2.5L for pt to return >91% (lengthy recovery needed ~80mn,with pursed lip breathing); HR in low 100s;  RN made aware     Follow Up Recommendations Home health PT;Supervision for mobility/OOB    Equipment Recommendations  None recommended by PT (pt refuses to use walker or cane)    Recommendations for Other Services       Precautions / Restrictions Precautions Precautions: Fall Precaution Comments: O2 (sats dropped to 76% on eval)      Mobility  Bed Mobility Overal bed mobility: Needs Assistance Bed Mobility: Supine to Sit     Supine to sit: Modified independent (Device/Increase time)        Transfers Overall transfer level: Needs assistance Equipment used: None Transfers: Sit to/from Stand Sit to Stand: Min guard;Min assist         General transfer comment: for balance and safety; pt is unsteady in standing  Ambulation/Gait Ambulation/Gait assistance: Min assist Ambulation Distance (Feet): 75 Feet Assistive device: None Gait Pattern/deviations: Trunk flexed;Drifts right/left;Step-through pattern Gait velocity:  decr   General Gait Details: pt declines use of AD, she is unsteady and drifts R/L throughout gait cycle, LOB x2 with delayed reactions  Stairs            Wheelchair Mobility    Modified Rankin (Stroke Patients Only)       Balance Overall balance assessment: Needs assistance;History of Falls           Standing balance-Leahy Scale: Fair               High level balance activites: Turns High Level Balance Comments: pt is unsteady during gait requiring min assist to maintain balance, significant drifting R/L--refuses AD             Pertinent Vitals/Pain Pain Assessment: No/denies pain    Home Living Family/patient expects to be discharged to:: Private residence Living Arrangements: Spouse/significant other Available Help at Discharge: Family Type of Home: House Home Access: Stairs to enter Entrance Stairs-Rails: None ETechnical brewerof Steps: 2 Home Layout: Two level;Bed/bath upstairs Home Equipment: Cane - single point      Prior Function Level of Independence: Independent         Comments: has a cane but prefers to furniture walk     Hand Dominance        Extremity/Trunk Assessment   Upper Extremity Assessment: Defer to OT evaluation           Lower Extremity Assessment: RLE deficits/detail;LLE deficits/detail      Cervical / Trunk Assessment: Kyphotic  Communication   Communication: No difficulties  Cognition Arousal/Alertness: Awake/alert Behavior During Therapy: [facility]for tasks  assessed/performed Overall Cognitive Status: Within Functional Limits for tasks assessed                      General Comments      Exercises        Assessment/Plan    PT Assessment Patient needs continued PT services  PT Diagnosis Difficulty walking   PT Problem List Decreased strength;Decreased range of motion;Decreased mobility;Decreased balance;Decreased activity tolerance;Decreased safety awareness;Decreased coordination   PT Treatment Interventions DME instruction;Gait training;Functional mobility training;Therapeutic activities;Therapeutic exercise;Stair training;Balance training   PT Goals (Current goals can be found in the Care Plan section)      Frequency Min 3X/week   Barriers to discharge        Co-evaluation PT/OT/SLP Co-Evaluation/Treatment: Yes Reason for Co-Treatment: For patient/therapist safety PT goals addressed during session: Mobility/safety with mobility         End of Session Equipment Utilized During Treatment: Gait belt Activity Tolerance: Patient tolerated treatment well Patient left: in bed;with call bell/phone within reach;with bed alarm set Nurse Communication: Mobility status         Time: 1761-6073 PT Time Calculation (min) (ACUTE ONLY): 20 min   Charges:   PT Evaluation $PT Eval Moderate Complexity: 1 Procedure     PT G Codes:        Julann Mcgilvray 2015-08-10, 11:25 AM

## 2015-07-31 NOTE — H&P (Signed)
PCP:  Thressa Sheller, [facility]   Referring provider Zenia Resides   Chief Complaint:  cellulutis  HPI: Angel Estrada is a 82 y.o. female   has a past medical history of CHF (congestive heart failure) ([place]); Diabetes mellitus without complication ([place]); Hypertension; COPD (chronic obstructive pulmonary disease) ([place]); Depression; and Osteoporosis.   Presented with she started to have some leg swelling that has been progressive developed redness worse on the left. Denies any fever no chills, no nausea no vomiting. She have ha some increased shortness of breath. Denies any chest pain. She have had weeping edema. Reports lisinopril made her itch and se was taken off of it. Reports gaining almost 10 lb of fluid. She was seen by PA today and was told she has cellulitis and was sent to ER.   IN ER: Cr 1.43. Lactic acid 1.24, troponin 0.04 BNP 764 CXR Large bowel containing hernia in her chest  she was started on vancomycin for presumed cellulitis  Regarding pertinent past history: hx of diastolic CHF, she used to have hx of DM but it was diet controlled she has not been on any medications ever.   Hospitalist was called for admission for CHF exacerbation and cellulitis  Review of Systems:    Pertinent positives include: Bilateral lower extremity swelling dyspnea on exertion  Constitutional:  No weight loss, night sweats, Fevers, chills, fatigue, weight loss  HEENT:  No headaches, Difficulty swallowing,Tooth/dental problems,Sore throat,  No sneezing, itching, ear ache, nasal congestion, post nasal drip,  Cardio-vascular:  No chest pain, Orthopnea, PND, anasarca, dizziness, palpitations.no  GI:  No heartburn, indigestion, abdominal pain, nausea, vomiting, diarrhea, change in bowel habits, loss of appetite, melena, blood in stool, hematemesis Resp:  no shortness of breath at rest. No , No excess mucus, no productive cough, No non-productive cough, No coughing up of  blood.No change in color of mucus.No wheezing. Skin:  no rash or lesions. No jaundice GU:  no dysuria, change in color of urine, no urgency or frequency. No straining to urinate.  No flank pain.  Musculoskeletal:  No joint pain or no joint swelling. No decreased range of motion. No back pain.  Psych:  No change in mood or affect. No depression or anxiety. No memory loss.  Neuro: no localizing neurological complaints, no tingling, no weakness, no double vision, no gait abnormality, no slurred speech, no confusion  Otherwise ROS are negative except for above, 10 systems were reviewed  Past Medical History: Past Medical History  Diagnosis Date  . CHF (congestive heart failure) ([place])   . Diabetes mellitus without complication ([place])   . Hypertension   . COPD (chronic obstructive pulmonary disease) ([place])   . Depression   . Osteoporosis    Past Surgical History  Procedure Laterality Date  . US echocardiography    . Cataract extraction Bilateral      Medications: Prior to Admission medications   Medication Sig Start Date End Date Taking? Authorizing Provider  citalopram (CELEXA) 40 MG tablet Take 40 mg by mouth daily. 07/13/15  Yes Historical Provider, MD  fluticasone (FLONASE) 50 MCG/ACT nasal spray Place 2 sprays into both nostrils daily.   Yes Historical Provider, MD  furosemide (LASIX) 40 MG tablet Take 40-60 mg by mouth daily. 60 mg every morning and 40 mg every afternoon 07/09/15  Yes Historical Provider, MD  nystatin-triamcinolone (MYCOLOG II) cream Apply 1 application topically 2 (two) times daily as needed (rash). Reported on 07/30/2015 07/02/15  Yes Historical Provider, MD  PROAIR HFA 108 (90 Base) MCG/ACT inhaler Inhale 2 puffs into the lungs every 6 (six) hours as needed for wheezing or shortness of breath.  07/02/15  Yes Historical Provider, MD  triamcinolone cream (KENALOG) 0.1 % Apply 1 application topically 2 (two) times daily as needed (skin irritation).   Yes Historical  Provider, MD    Allergies:   Allergies  Allergen Reactions  . Lisinopril-Hydrochlorothiazide Other (See Comments)    Pt's PCP took her off medication because of possible kidney damage  . Sulfa Antibiotics Other (See Comments)    Unknown-- childhood allergy   . Latex Rash  . Penicillins Swelling and Rash    Has patient had a PCN reaction causing immediate rash, facial/tongue/throat swelling, SOB or lightheadedness with hypotension: yes    Social History:  Ambulatory   independently   Lives at home alone with 15 cats     reports that she has never smoked. She does not have any smokeless tobacco history on file. She reports that she does not drink alcohol or use illicit drugs.     Family History: family history includes Brain cancer in her father; Diabetes type II in her brother and mother; Hypertension in her father and mother; Stroke in her other.    Physical Exam: Patient Vitals for the past 24 hrs:  BP Temp Temp src Pulse Resp SpO2  07/31/15 0000 133/92 mmHg - - 92 14 97 %  07/30/15 2330 132/87 mmHg - - 88 15 96 %  07/30/15 2300 122/90 mmHg - - 95 15 97 %  07/30/15 2230 124/96 mmHg - - 89 14 97 %  07/30/15 2200 126/100 mmHg - - 66 11 (!) 81 %  07/30/15 2148 126/92 mmHg - - 95 15 97 %  07/30/15 2000 - - - 93 15 96 %  07/30/15 1930 122/89 mmHg 98.4 F (36.9 C) Oral 108 20 (!) 80 %    1. General:  in No Acute distress 2. Psychological: Alert and   Oriented 3. Head/ENT:   Moist   Mucous Membranes                          Head Non traumatic, neck supple                            Poor Dentition 4. SKIN: normal  Skin turgor,  Skin clean Dry erythema and drainage of lower extremeties bilaterally                                                            5. Heart: Regular rate and rhythm  systolic Murmur, no Rub or gallop 6. Lungs:   no wheezes some crackles   7. Abdomen: Soft, non-tender, Non distended 8. Lower extremities: no clubbing, cyanosis, or edema 9.  Neurologically Grossly intact, moving all 4 extremities equally 10. MSK: Normal range of motion  body mass index is unknown because there is no height or weight on file.   Labs on Admission:   Results for orders placed or performed during the hospital encounter of 07/30/15 (from the past 24 hour(s))  Comprehensive metabolic panel     Status: Abnormal   Collection Time: 07/30/15  9:16 PM  Result Value  Ref Range   Sodium 140 135 - 145 mmol/L   Potassium 3.7 3.5 - 5.1 mmol/L   Chloride 94 (L) 101 - 111 mmol/L   CO2 32 22 - 32 mmol/L   Glucose, Bld 94 65 - 99 mg/dL   BUN 41 (H) 6 - 20 mg/dL   Creatinine, Ser 1.47 (H) 0.44 - 1.00 mg/dL   Calcium 9.3 8.9 - 10.3 mg/dL   Total Protein 7.0 6.5 - 8.1 g/dL   Albumin 3.8 3.5 - 5.0 g/dL   AST 31 15 - 41 U/L   ALT 23 14 - 54 U/L   Alkaline Phosphatase 86 38 - 126 U/L   Total Bilirubin 1.5 (H) 0.3 - 1.2 mg/dL   GFR calc non Af Amer 33 (L) >60 mL/min   GFR calc Af Amer 38 (L) >60 mL/min   Anion gap 14 5 - 15  I-Stat CG4 Lactic Acid, ED (Not at [facility])     Status: None   Collection Time: 07/30/15  9:25 PM  Result Value Ref Range   Lactic Acid, Venous 1.24 0.5 - 2.0 mmol/L  Protime-INR     Status: None   Collection Time: 07/30/15 10:00 PM  Result Value Ref Range   Prothrombin Time 14.8 11.6 - 15.2 seconds   INR 1.19 0.00 - 1.49  APTT     Status: None   Collection Time: 07/30/15 10:00 PM  Result Value Ref Range   aPTT 27 24 - 37 seconds  CBC with Differential/Platelet     Status: Abnormal   Collection Time: 07/30/15 10:00 PM  Result Value Ref Range   WBC 9.1 4.0 - 10.5 K/uL   RBC 4.90 3.87 - 5.11 MIL/uL   Hemoglobin 15.0 12.0 - 15.0 g/dL   HCT 46.8 (H) 36.0 - 46.0 %   MCV 95.5 78.0 - 100.0 fL   MCH 30.6 26.0 - 34.0 pg   MCHC 32.1 30.0 - 36.0 g/dL   RDW 14.4 11.5 - 15.5 %   Platelets 276 150 - 400 K/uL   Neutrophils Relative % 76 %   Neutro Abs 6.9 1.7 - 7.7 K/uL   Lymphocytes Relative 11 %   Lymphs Abs 1.0 0.7 - 4.0 K/uL    Monocytes Relative 12 %   Monocytes Absolute 1.1 (H) 0.1 - 1.0 K/uL   Eosinophils Relative 1 %   Eosinophils Absolute 0.1 0.0 - 0.7 K/uL   Basophils Relative 0 %   Basophils Absolute 0.0 0.0 - 0.1 K/uL  Brain natriuretic peptide     Status: Abnormal   Collection Time: 07/30/15 10:00 PM  Result Value Ref Range   B Natriuretic Peptide 764.5 (H) 0.0 - 100.0 pg/mL  Troponin I     Status: Abnormal   Collection Time: 07/30/15 11:14 PM  Result Value Ref Range   Troponin I 0.04 (H) <0.031 ng/mL  I-Stat CG4 Lactic Acid, ED (Not at [facility])     Status: None   Collection Time: 07/30/15 11:23 PM  Result Value Ref Range   Lactic Acid, Venous 0.88 0.5 - 2.0 mmol/L    UA ordered  No results found for: HGBA1C  CrCl cannot be calculated (Unknown ideal weight.).  BNP (last 3 results) No results for input(s): PROBNP in the last 8760 hours.  Other results:  I have pearsonaly reviewed this: ECG REPORT  Rate: 100  Rhythm:  NSR ST&T Change: no ischemic changes QTC 446  There were no vitals filed for this visit.   Cultures: No results found  for: SDES, SPECREQUEST, CULT, REPTSTATUS   Radiological Exams on Admission: Dg Chest 2 View  07/30/2015  CLINICAL DATA:  82 year old female with hypoxia. Cellulitis of the lower extremities. EXAM: CHEST  2 VIEW COMPARISON:  None. FINDINGS: Two views of the chest demonstrate large focal area of opacity at the left lung base which appears to contain bowel gas and most likely represents large hiatal or diaphragmatic hernia. There is associated compressive atelectasis of the left lung base. Pneumonia is not excluded. The right lung is clear. There is emphysematous changes of the spine. Trace right pleural effusion noted. There is silhouetting of the left cardiac border. There is thoracolumbar scoliosis. No acute fracture. IMPRESSION: Large bowel containing hernia in the left lung base. Underlying infiltrate is not excluded. Electronically Signed   By: Anner Crete M.D.   On: 07/30/2015 20:32    Chart has been reviewed  Family not at  Bedside    Assessment/Plan 82 year old female with history of diastolic heart failure presents today for fluid overload and we'll repeat low extremity edema right worse than left with evidence of cellulitis  Present on Admission:   . Acute on chronic diastolic CHF (congestive heart failure), NYHA class 1 ([place]) -  - admit on telemetry, cycle cardiac enzymes, obtain serial ECG, to evaluate for ischemia as a cause of heart failure  monitor daily weight  diurese with IV lasix and monitor orthostatics and creatinine to avoid over diuresis.  Order echogram to evaluate EF and valves patient is allergic to ACE/ARBi  Consider cardiology consult . Hypertension- currently stable patient had apparently an allergic reaction to Ace inhibitors we'll continue to hold off  Her blood pressure has been stable Diabetes mellitus diet-controlled- has not been on any home medications. We'll continue to follow blood sugars Cellulitis continued with vancomycin Symmetric lower extremity swelling will obtain Dopplers . COPD (chronic obstructive pulmonary disease) (HCC) chronic Will monitor order albuterol as needed and scheduled Atrovent as COPD could be contributing to dyspnea  Prophylaxis:  Lovenox  CODE STATUS:  FULL CODE   as per patient    Disposition:  To home once workup is complete and patient is stable  Other plan as per orders.  I have spent a total of 55 min on this admission   Delainy Mcelhiney 07/31/2015, 1:38 AM    [facility]  Pager [phone]   after 2 AM please page floor coverage PA If 7AM-7PM, please contact the day team taking care of the patient  [email or web address]  Password [email or web address]

## 2015-07-31 NOTE — Progress Notes (Signed)
Pharmacy Antibiotic Note  Angel Estrada is a 82 y.o. female admitted on 07/30/2015 with cellulitis.  Pharmacy has been consulted for Vancomycin dosing.  Plan: Vancomycin 562m IV every 24 hours.  Goal trough 10-15 mcg/mL.  Height: 5' 4" (162.6 cm) Weight: 122 lb 4.8 oz (55.475 kg) IBW/kg (Calculated) : 54.7  Temp (24hrs), Avg:98.1 F (36.7 C), Min:97.8 F (36.6 C), Max:98.4 F (36.9 C)   Recent Labs Lab 07/30/15 2116 07/30/15 2125 07/30/15 2200 07/30/15 2323  WBC  --   --  9.1  --   CREATININE 1.47*  --   --   --   LATICACIDVEN  --  1.24  --  0.88    Estimated Creatinine Clearance: 26.8 mL/min (by C-G formula based on Cr of 1.47).    Allergies  Allergen Reactions  . Lisinopril-Hydrochlorothiazide Other (See Comments)    Pt's PCP took her off medication because of possible kidney damage  . Sulfa Antibiotics Other (See Comments)    Unknown-- childhood allergy   . Latex Rash  . Penicillins Swelling and Rash    Has patient had a PCN reaction causing immediate rash, facial/tongue/throat swelling, SOB or lightheadedness with hypotension: yes    Antimicrobials this admission: Vancomycin 3/28 >>   Thank you for allowing pharmacy to be a part of this patient's care.  GNani SkillernCrowford 07/31/2015 3:01 AM

## 2015-08-01 DIAGNOSIS — I10 Essential (primary) hypertension: Secondary | ICD-10-CM

## 2015-08-01 DIAGNOSIS — I5033 Acute on chronic diastolic (congestive) heart failure: Secondary | ICD-10-CM

## 2015-08-01 DIAGNOSIS — L03115 Cellulitis of right lower limb: Secondary | ICD-10-CM

## 2015-08-01 LAB — URINE CULTURE

## 2015-08-01 LAB — BASIC METABOLIC PANEL
Anion gap: 9 (ref 5–15)
BUN: 40 mg/dL — AB (ref 6–20)
CALCIUM: 8.5 mg/dL — AB (ref 8.9–10.3)
CO2: 32 mmol/L (ref 22–32)
CREATININE: 1.31 mg/dL — AB (ref 0.44–1.00)
Chloride: 99 mmol/L — ABNORMAL LOW (ref 101–111)
GFR calc Af Amer: 44 mL/min — ABNORMAL LOW (ref >60)
GFR, EST NON AFRICAN AMERICAN: 38 mL/min — AB (ref >60)
GLUCOSE: 97 mg/dL (ref 65–99)
Potassium: 4 mmol/L (ref 3.5–5.1)
SODIUM: 140 mmol/L (ref 135–145)

## 2015-08-01 MED ORDER — IPRATROPIUM-ALBUTEROL 0.5-2.5 (3) MG/3ML IN SOLN
3.0000 mL | Freq: Two times a day (BID) | RESPIRATORY_TRACT | Status: DC
  Administered 2015-08-02: 3 mL via RESPIRATORY_TRACT
  Filled 2015-08-01 (×2): qty 3

## 2015-08-01 MED ORDER — SALINE SPRAY 0.65 % NA SOLN
1.0000 | NASAL | Status: DC | PRN
  Administered 2015-08-01: 1 via NASAL
  Filled 2015-08-01: qty 44

## 2015-08-01 NOTE — Progress Notes (Signed)
SATURATION QUALIFICATIONS: (This note is used to comply with regulatory documentation for home oxygen)  Patient Saturations on Room Air at Rest = 90%  Patient Saturations on Room Air while Ambulating = 75%  Patient Saturations on 2 Liters of oxygen while Ambulating = 90%  Please briefly explain why patient needs home oxygen:

## 2015-08-01 NOTE — Care Management Note (Signed)
Case Management Note  Patient Details  Name: Keajah Killough MRN: [id number] Date of Birth: 1933-08-15  Subjective/Objective:  82 y/o f admitted w/CHF. From home.Spoke to patient in rm about PT recc- HHC. Patient states" I'm a hoarder & I dont want anyone coming to my house, I also have cats, & I am not home bound, I drive" Adamantly declines [place]. Noted-HHTP ordered-patient declines HHC.Noted may need home 02-Nurse informed to document 02 sats through smart text for accuracy of qualifying for home 02.[facility] dme rep Lecretia aware & following.                 Action/Plan:d/c plan home.   Expected Discharge Date:                  Expected Discharge Plan:  Home/Self Care  In-House Referral:     Discharge planning Services  CM Consult  Post Acute Care Choice:    Choice offered to:     DME Arranged:    DME Agency:     HH Arranged:  Patient Refused HH Agency:     Status of Service:  In process, will continue to follow  Medicare Important Message Given:    Date Medicare IM Given:    Medicare IM give by:    Date Additional Medicare IM Given:    Additional Medicare Important Message give by:     If discussed at [place] of Stay Meetings, dates discussed:    Additional Comments:  Dessa Phi, RN 08/01/2015, 3:40 PM

## 2015-08-01 NOTE — Progress Notes (Addendum)
[facility] PROGRESS NOTE    Progress Note   [name] TDD:[id number] DOB: 02/19/1934 DOA: 07/30/2015 PCP: Thressa Sheller, MD   Brief Narrative:   Angel Estrada is an 82 y.o. female   Assessment/Plan:   Left lower extremity cellulitis: Lower extremity Doppler was negative for DVT, she was started empirically on IV vancomycin on admission. She has remained afebrile with no leukocytosis. Still red and tender to touch. Continue IV vancomycin.  Acute on chronic diastolic heart failure: Continue strict I's and O's daily weights limited her fluid intake. Mild elevation in cardiac enzymes and likely demand ischemia from heart failure and infection. She denies any chest pain or short of breath. Continue IV diuresis she still seems to be fluid overloaded.  COPD: Stable.  Essential  Hypertension: Well controlled continue current regimen.  Controlled diabetes mellitus type 2 :continue CBGs before meals and at bedtime.  Chronic kidney disease stage III: This probably her baseline creatinine her renal function has not improve with diuresis. Continue to monitor electrolytes with diuresis.    DVT Prophylaxis - Lovenox ordered.  Family Communication: none Disposition Plan: Home when stable. Code Status:     Code Status Orders        Start     Ordered   07/31/15 0249  Full code   Continuous     07/31/15 0248    Code Status History    Date Active Date Inactive Code Status Order ID Comments User Context   This patient has a current code status but no historical code status.        IV Access:    Peripheral IV   Procedures and diagnostic studies:   Dg Chest 2 View  07/30/2015  CLINICAL DATA:  82 year old female with hypoxia. Cellulitis of the lower extremities. EXAM: CHEST  2 VIEW COMPARISON:  None. FINDINGS: Two views of the chest demonstrate large focal area of opacity at the left lung base which appears to contain bowel gas and most likely represents  large hiatal or diaphragmatic hernia. There is associated compressive atelectasis of the left lung base. Pneumonia is not excluded. The right lung is clear. There is emphysematous changes of the spine. Trace right pleural effusion noted. There is silhouetting of the left cardiac border. There is thoracolumbar scoliosis. No acute fracture. IMPRESSION: Large bowel containing hernia in the left lung base. Underlying infiltrate is not excluded. Electronically Signed   By: Anner Crete M.D.   On: 07/30/2015 20:32     Medical Consultants:    None.  Anti-Infectives:   Anti-infectives    Start     Dose/Rate Route Frequency Ordered Stop   07/31/15 0300  vancomycin (VANCOCIN) 500 mg in sodium chloride 0.9 % 100 mL IVPB     500 mg 100 mL/hr over 60 Minutes Intravenous Daily at bedtime 07/31/15 0248        Subjective:    [name] she relates her left foot continues to be painful.  Objective:    Filed Vitals:   07/31/15 2102 07/31/15 2131 08/01/15 0542 08/01/15 0818  BP:  109/75 106/76   Pulse:  103 90   Temp:  97.9 F (36.6 C) 98 F (36.7 C)   TempSrc:  Oral Oral   Resp:  18 16   Height:      Weight:   54.704 kg (120 lb 9.6 oz)   SpO2: 98% 94% 93% 92%    Intake/Output Summary (Last 24 hours) at 08/01/15 1201 Last data filed at 08/01/15 [phone]  Gross per 24 hour  Intake    200 ml  Output    525 ml  Net   -325 ml   Filed Weights   07/31/15 0249 07/31/15 0525 08/01/15 0542  Weight: 55.475 kg (122 lb 4.8 oz) 55.475 kg (122 lb 4.8 oz) 54.704 kg (120 lb 9.6 oz)    Exam: Gen:  NAD Cardiovascular:  RRR, Positive JVD Chest and lungs:   Good air movement and clear to auscultation. Abdomen:  Abdomen soft, NT/ND, + BS Extremities:  Mild lower extremity edema, her left foot continues to be swollen and erythematous and tender to touch   Data Reviewed:    Labs: Basic Metabolic Panel:  Recent Labs Lab 07/30/15 2116 08/01/15 0451  NA 140 140  K 3.7 4.0  CL 94* 99*    CO2 32 32  GLUCOSE 94 97  BUN 41* 40*  CREATININE 1.47* 1.31*  CALCIUM 9.3 8.5*   GFR Estimated Creatinine Clearance: 30.1 mL/min (by C-G formula based on Cr of 1.31). Liver Function Tests:  Recent Labs Lab 07/30/15 2116  AST 31  ALT 23  ALKPHOS 86  BILITOT 1.5*  PROT 7.0  ALBUMIN 3.8   No results for input(s): LIPASE, AMYLASE in the last 168 hours. No results for input(s): AMMONIA in the last 168 hours. Coagulation profile  Recent Labs Lab 07/30/15 2200  INR 1.19    CBC:  Recent Labs Lab 07/30/15 2200  WBC 9.1  NEUTROABS 6.9  HGB 15.0  HCT 46.8*  MCV 95.5  PLT 276   Cardiac Enzymes:  Recent Labs Lab 07/30/15 2314 07/31/15 0302 07/31/15 0834 07/31/15 1420  TROPONINI 0.04* 0.04* 0.04* 0.04*   BNP (last 3 results) No results for input(s): PROBNP in the last 8760 hours. CBG: No results for input(s): GLUCAP in the last 168 hours. D-Dimer: No results for input(s): DDIMER in the last 72 hours. Hgb A1c: No results for input(s): HGBA1C in the last 72 hours. Lipid Profile: No results for input(s): CHOL, HDL, LDLCALC, TRIG, CHOLHDL, LDLDIRECT in the last 72 hours. Thyroid function studies: No results for input(s): TSH, T4TOTAL, T3FREE, THYROIDAB in the last 72 hours.  Invalid input(s): FREET3 Anemia work up: No results for input(s): VITAMINB12, FOLATE, FERRITIN, TIBC, IRON, RETICCTPCT in the last 72 hours. Sepsis Labs:  Recent Labs Lab 07/30/15 2125 07/30/15 2200 07/30/15 2323  WBC  --  9.1  --   LATICACIDVEN 1.24  --  0.88   Microbiology Recent Results (from the past 240 hour(s))  Culture, blood (routine x 2)     Status: None (Preliminary result)   Collection Time: 07/30/15  9:10 PM  Result Value Ref Range Status   Specimen Description BLOOD RIGHT ANTECUBITAL  Final   Special Requests BOTTLES DRAWN AEROBIC AND ANAEROBIC 5CC EACH  Final   Culture   Final    NO GROWTH 1 DAY Performed at [facility]    Report Status PENDING   Incomplete  Culture, blood (routine x 2)     Status: None (Preliminary result)   Collection Time: 07/30/15  9:15 PM  Result Value Ref Range Status   Specimen Description BLOOD LEFT ANTECUBITAL  Final   Special Requests BOTTLES DRAWN AEROBIC AND ANAEROBIC 5CC EACH  Final   Culture   Final    NO GROWTH 1 DAY Performed at [facility]    Report Status PENDING  Incomplete     Medications:   . aspirin EC  81 mg Oral Daily  . citalopram  40 mg Oral Daily  . enoxaparin (LOVENOX) injection  30 mg Subcutaneous Q24H  . furosemide  40 mg Intravenous BID  . ipratropium-albuterol  3 mL Nebulization TID  . potassium chloride  20 mEq Oral BID  . sodium chloride flush  3 mL Intravenous Q12H  . vancomycin  500 mg Intravenous QHS   Continuous Infusions:   Time spent: 25 min   LOS: 1 day   Charlynne Cousins  [facility] Pager [phone]  *Please refer to [email or web address], password [email or web address] to get updated schedule on who will round on this patient, as hospitalists switch teams weekly. If 7PM-7AM, please contact night-coverage at [email or web address], password [email or web address] for any overnight needs.  08/01/2015, 12:01 PM

## 2015-08-01 NOTE — Progress Notes (Signed)
While ambulating to bathroom on RA patients o2 saturations dropped into the 70's.  Placed patient back on 2L o2, saturations rose to 92.

## 2015-08-02 DIAGNOSIS — L03116 Cellulitis of left lower limb: Principal | ICD-10-CM

## 2015-08-02 DIAGNOSIS — J42 Unspecified chronic bronchitis: Secondary | ICD-10-CM

## 2015-08-02 LAB — BASIC METABOLIC PANEL
Anion gap: 8 (ref 5–15)
BUN: 34 mg/dL — ABNORMAL HIGH (ref 6–20)
CALCIUM: 8.9 mg/dL (ref 8.9–10.3)
CO2: 34 mmol/L — ABNORMAL HIGH (ref 22–32)
Chloride: 101 mmol/L (ref 101–111)
Creatinine, Ser: 1.14 mg/dL — ABNORMAL HIGH (ref 0.44–1.00)
GFR calc Af Amer: 52 mL/min — ABNORMAL LOW (ref >60)
GFR calc non Af Amer: 45 mL/min — ABNORMAL LOW (ref >60)
Glucose, Bld: 96 mg/dL (ref 65–99)
POTASSIUM: 4.4 mmol/L (ref 3.5–5.1)
SODIUM: 143 mmol/L (ref 135–145)

## 2015-08-02 MED ORDER — ENOXAPARIN SODIUM 40 MG/0.4ML ~~LOC~~ SOLN
40.0000 mg | SUBCUTANEOUS | Status: DC
  Administered 2015-08-02 – 2015-08-04 (×3): 40 mg via SUBCUTANEOUS
  Filled 2015-08-02 (×3): qty 0.4

## 2015-08-02 MED ORDER — IPRATROPIUM-ALBUTEROL 0.5-2.5 (3) MG/3ML IN SOLN: 3.0000 mL | Freq: Four times a day (QID) | RESPIRATORY_TRACT | Status: DC | PRN

## 2015-08-02 NOTE — Progress Notes (Signed)
[facility] PROGRESS NOTE    Progress Note   Angel Estrada VQQ:[id number] DOB: 01-27-1934 DOA: 07/30/2015 PCP: Thressa Sheller, MD   Brief Narrative:   Angel Estrada is an 82 y.o. female   Assessment/Plan:   Left lower extremity cellulitis: COnt  IV vancomycin on admission. She has remained afebrile with no leukocytosis. Not tender to touch.  Acute on chronic diastolic heart failure: Continue strict I's and O's daily weights limited her fluid intake. Mild elevation in cardiac enzymes and likely demand ischemia from heart failure and infection. She denies any chest pain or short of breath. Continue IV diuresis she still seems to be fluid overloaded.  COPD: Stable.  Essential  Hypertension: Well controlled continue current regimen.  Controlled diabetes mellitus type 2: continue CBGs before meals and at bedtime.  Chronic kidney disease stage III: Renal is now improving with diuresis. Continue to monitor electrolytes with diuresis.    DVT Prophylaxis - Lovenox ordered.  Family Communication: none Disposition Plan: Home 1-2 days Code Status:     Code Status Orders        Start     Ordered   07/31/15 0249  Full code   Continuous     07/31/15 0248    Code Status History    Date Active Date Inactive Code Status Order ID Comments User Context   This patient has a current code status but no historical code status.        IV Access:    Peripheral IV   Procedures and diagnostic studies:   No results found.   Medical Consultants:    None.  Anti-Infectives:   Anti-infectives    Start     Dose/Rate Route Frequency Ordered Stop   07/31/15 0300  vancomycin (VANCOCIN) 500 mg in sodium chloride 0.9 % 100 mL IVPB     500 mg 100 mL/hr over 60 Minutes Intravenous Daily at bedtime 07/31/15 0248        Subjective:    [name] no pains or complains.  Objective:    Filed Vitals:   08/01/15 2103 08/01/15 2145 08/02/15 0518 08/02/15  0823  BP: 108/77  96/83   Pulse: 87  90   Temp: 97.6 F (36.4 C)  98.9 F (37.2 C)   TempSrc: Oral  Oral   Resp: 19  15   Height:      Weight:   55.974 kg (123 lb 6.4 oz)   SpO2: 92% 93% 95% 94%    Intake/Output Summary (Last 24 hours) at 08/02/15 1307 Last data filed at 08/02/15 1117  Gross per 24 hour  Intake    540 ml  Output   1250 ml  Net   -710 ml   Filed Weights   07/31/15 0525 08/01/15 0542 08/02/15 0518  Weight: 55.475 kg (122 lb 4.8 oz) 54.704 kg (120 lb 9.6 oz) 55.974 kg (123 lb 6.4 oz)    Exam: Gen:  NAD Cardiovascular:  RRR, + JVD Chest and lungs:   Good air movement and clear to auscultation. Abdomen:  Abdomen soft, NT/ND, + BS Extremities:  Mild lower extremity edema, her left foot is not swollen,  erythematous or tender to touch   Data Reviewed:    Labs: Basic Metabolic Panel:  Recent Labs Lab 07/30/15 2116 08/01/15 0451 08/02/15 0505  NA 140 140 143  K 3.7 4.0 4.4  CL 94* 99* 101  CO2 32 32 34*  GLUCOSE 94 97 96  BUN 41* 40* 34*  CREATININE 1.47* 1.31* 1.14*  CALCIUM 9.3 8.5* 8.9   GFR Estimated Creatinine Clearance: 34.6 mL/min (by C-G formula based on Cr of 1.14). Liver Function Tests:  Recent Labs Lab 07/30/15 2116  AST 31  ALT 23  ALKPHOS 86  BILITOT 1.5*  PROT 7.0  ALBUMIN 3.8   No results for input(s): LIPASE, AMYLASE in the last 168 hours. No results for input(s): AMMONIA in the last 168 hours. Coagulation profile  Recent Labs Lab 07/30/15 2200  INR 1.19    CBC:  Recent Labs Lab 07/30/15 2200  WBC 9.1  NEUTROABS 6.9  HGB 15.0  HCT 46.8*  MCV 95.5  PLT 276   Cardiac Enzymes:  Recent Labs Lab 07/30/15 2314 07/31/15 0302 07/31/15 0834 07/31/15 1420  TROPONINI 0.04* 0.04* 0.04* 0.04*   BNP (last 3 results) No results for input(s): PROBNP in the last 8760 hours. CBG: No results for input(s): GLUCAP in the last 168 hours. D-Dimer: No results for input(s): DDIMER in the last 72 hours. Hgb  A1c: No results for input(s): HGBA1C in the last 72 hours. Lipid Profile: No results for input(s): CHOL, HDL, LDLCALC, TRIG, CHOLHDL, LDLDIRECT in the last 72 hours. Thyroid function studies: No results for input(s): TSH, T4TOTAL, T3FREE, THYROIDAB in the last 72 hours.  Invalid input(s): FREET3 Anemia work up: No results for input(s): VITAMINB12, FOLATE, FERRITIN, TIBC, IRON, RETICCTPCT in the last 72 hours. Sepsis Labs:  Recent Labs Lab 07/30/15 2125 07/30/15 2200 07/30/15 2323  WBC  --  9.1  --   LATICACIDVEN 1.24  --  0.88   Microbiology Recent Results (from the past 240 hour(s))  Culture, blood (routine x 2)     Status: None (Preliminary result)   Collection Time: 07/30/15  9:10 PM  Result Value Ref Range Status   Specimen Description BLOOD RIGHT ANTECUBITAL  Final   Special Requests BOTTLES DRAWN AEROBIC AND ANAEROBIC 5CC EACH  Final   Culture   Final    NO GROWTH 1 DAY Performed at [facility]    Report Status PENDING  Incomplete  Culture, blood (routine x 2)     Status: None (Preliminary result)   Collection Time: 07/30/15  9:15 PM  Result Value Ref Range Status   Specimen Description BLOOD LEFT ANTECUBITAL  Final   Special Requests BOTTLES DRAWN AEROBIC AND ANAEROBIC 5CC EACH  Final   Culture   Final    NO GROWTH 1 DAY Performed at [facility]    Report Status PENDING  Incomplete  Urine culture     Status: None   Collection Time: 07/31/15  2:20 AM  Result Value Ref Range Status   Specimen Description URINE, CLEAN CATCH  Final   Special Requests NONE  Final   Culture   Final    MULTIPLE SPECIES PRESENT, SUGGEST RECOLLECTION Performed at [facility]    Report Status 08/01/2015 FINAL  Final     Medications:   . aspirin EC  81 mg Oral Daily  . citalopram  40 mg Oral Daily  . enoxaparin (LOVENOX) injection  40 mg Subcutaneous Q24H  . furosemide  40 mg Intravenous BID  . ipratropium-albuterol  3 mL Nebulization BID  . potassium  chloride  20 mEq Oral BID  . sodium chloride flush  3 mL Intravenous Q12H  . vancomycin  500 mg Intravenous QHS   Continuous Infusions:   Time spent: 25 min   LOS: 2 days   Charlynne Cousins  [facility] Pager [phone]  *Please refer to  [email or web address], password [email or web address] to get updated schedule on who will round on this patient, as hospitalists switch teams weekly. If 7PM-7AM, please contact night-coverage at [email or web address], password [email or web address] for any overnight needs.  08/02/2015, 1:07 PM

## 2015-08-02 NOTE — Progress Notes (Addendum)
SATURATION QUALIFICATIONS: (This note is used to comply with regulatory documentation for home oxygen)  Patient Saturations on Room Air at Rest =  86 %  Patient Saturations on Room Air while Ambulating = 78-82%  Patient Saturations on 3 Liters of oxygen while Ambulating = 88%  Please briefly explain why patient needs home oxygen: Patient becomes extremely labored with any type of activity. Oxygen saturation decreased 86-88 % on 3 liters while ambulating to the rest room. O2 increased to 4 lt to assist with increased saturation, then weaned back down to 3 lt for maintenance. O2 sat better 89-94%.

## 2015-08-02 NOTE — Progress Notes (Signed)
Occupational Therapy Treatment Patient Details Name: Angel Estrada MRN: [id number] DOB: 1933/12/31 Today's Date: 08/02/2015    History of present illness Angel Estrada is a 82 y.o. female adm with bil LE redness and swelling;   doppler negative for DVTpast medical history of CHF,  Diabetes mellitus without complication, Hypertension; COPD, Depression, and Osteoporosis.    OT comments  Pt unsteady but tends to furniture hold.  Educated on pursed lip breathing and energy conservation.  Follow Up Recommendations  No OT follow up;Supervision/Assistance - 24 hour    Equipment Recommendations   (pt does not want shower seat; has high toilet)    Recommendations for Other Services      Precautions / Restrictions Precautions Precautions: Fall Precaution Comments: monitor sats Restrictions Weight Bearing Restrictions: No       Mobility Bed Mobility Overal bed mobility: Modified Independent                Transfers Overall transfer level: Needs assistance   Transfers: Sit to/from Stand Sit to Stand: Min guard         General transfer comment: close guard for safety    Balance Overall balance assessment: Needs assistance         Standing balance support: During functional activity Standing balance-Leahy Scale: Fair                     ADL                           Toilet Transfer: Min guard;Ambulation;Comfort height toilet             General ADL Comments: pt performed toilet transfer:  min guard for safety:  unsteady and furniture holds.  Stood at sink to brush teeth/hands with supervision/set up.  Educated on energy conservation and pursed lip breathing.  Gave her a handout on these things. She does not want any DME nor follow up OT once she leaves the hospital      Vision                     Perception     Praxis      Cognition   Behavior During Therapy: [facility] for tasks assessed/performed Overall Cognitive Status:  Within Functional Limits for tasks assessed (pt is aware that she's unsteady)                       Extremity/Trunk Assessment               Exercises     Shoulder Instructions       General Comments      Pertinent Vitals/ Pain       Pain Assessment: No/denies pain  Home Living                                          Prior Functioning/Environment              Frequency Min 2X/week     Progress Toward Goals  OT Goals(current goals can now be found in the care plan section)  Progress towards OT goals: Progressing toward goals     Plan      Co-evaluation                 End of Session  Activity Tolerance Patient tolerated treatment well   Patient Left in bed;with call bell/phone within reach;with bed alarm set   Nurse Communication          Time: [phone] OT Time Calculation (min): 22 min  Charges: OT General Charges $OT Visit: 1 Procedure OT Treatments $Self Care/Home Management : 8-22 mins  Angel Estrada 08/02/2015, 4:08 PM   [name], OTR/L [phone] 08/02/2015

## 2015-08-02 NOTE — Care Management Note (Signed)
Case Management Note  Patient Details  Name: Angel Estrada MRN: [id number] Date of Birth: 10/12/1933  Subjective/Objective:  Noted-patient still declines HHC-she states she is not homebound-MD notified even though we have [facility] orders.patient qualifies for home 02-[facility] dme rep Lecretia aware & following-await home 02 orders.                  Action/Plan:d/c plan home.   Expected Discharge Date:                  Expected Discharge Plan:  Home/Self Care  In-House Referral:     Discharge planning Services  CM Consult  Post Acute Care Choice:  Durable Medical Equipment Choice offered to:     DME Arranged:    DME Agency:     HH Arranged:  Patient Refused [place] Agency:     Status of Service:  In process, will continue to follow  Medicare Important Message Given:    Date Medicare IM Given:    Medicare IM give by:    Date Additional Medicare IM Given:    Additional Medicare Important Message give by:     If discussed at [place] of Stay Meetings, dates discussed:    Additional Comments:  Dessa Phi, RN 08/02/2015, 1:17 PM

## 2015-08-02 NOTE — Progress Notes (Signed)
Physical Therapy Treatment Patient Details Name: Angel Estrada MRN: [id number] DOB: Sep 05, 1933 Today's Date: 08/02/2015      History of Present Illness Angel Estrada is a 82 y.o. female adm with bil LE redness and swelling;   doppler negative for DVTpast medical history of CHF,  Diabetes mellitus without complication, Hypertension; COPD, Depression, and Osteoporosis.     PT Comments    Progressing slowly with mobility. Pt continues to require Min assist for ambulation. Pt remains at risk for falls. She will not agree to use a walker or cane. Pt did state she has a cane at home however, per her report, it does not seem to be safe for use ("too short", "missing tip").  O2 sats: 93% on 3.5L at rest, 86% on 4L during ambulation.  Follow Up Recommendations  Home health PT;Supervision for mobility/OOB (Pt refuses HHPT follow up)     Equipment Recommendations   recommend RW use but pt refuses all DME. She did state she has a cane at home but that it is too short/missing tip.    Recommendations for Other Services       Precautions / Restrictions Precautions Precautions: Fall Precaution Comments: monitor sats Restrictions Weight Bearing Restrictions: No    Mobility  Bed Mobility Overal bed mobility: Modified Independent                Transfers Overall transfer level: Needs assistance   Transfers: Sit to/from Stand Sit to Stand: Min guard         General transfer comment: close guard for safety  Ambulation/Gait Ambulation/Gait assistance: Min assist Ambulation Distance (Feet): 100 Feet (100'x1, 75'x1) Assistive device: None Gait Pattern/deviations: Step-through pattern;Decreased stride length;Drifts right/left;Staggering left;Staggering right     General Gait Details: pt remains unsteady. Assist required to stabilize/prevent fall during ambulation. O2 sats 86% on 4L O2.    Stairs            Wheelchair Mobility    Modified Rankin (Stroke Patients  Only)       Balance Overall balance assessment: Needs assistance         Standing balance support: During functional activity Standing balance-Leahy Scale: Fair                      Cognition Arousal/Alertness: Awake/alert Behavior During Therapy: WFL for tasks assessed/performed Overall Cognitive Status: Within Functional Limits for tasks assessed                      Exercises      General Comments        Pertinent Vitals/Pain Pain Assessment: No/denies pain    Home Living                      Prior Function            PT Goals (current goals can now be found in the care plan section) Progress towards PT goals: Progressing toward goals (slowly)    Frequency  Min 3X/week    PT Plan Current plan remains appropriate    Co-evaluation             End of Session Equipment Utilized During Treatment: Gait belt;Oxygen Activity Tolerance: Patient tolerated treatment well Patient left: in bed;with call bell/phone within reach;with bed alarm set     Time: 1434-1453 PT Time Calculation (min) (ACUTE ONLY): 19 min  Charges:  $Gait Training: 8-22 mins  G Codes:      Angel Estrada, MPT Pager: [phone]

## 2015-08-03 DIAGNOSIS — E119 Type 2 diabetes mellitus without complications: Secondary | ICD-10-CM

## 2015-08-03 LAB — BASIC METABOLIC PANEL
Anion gap: 8 (ref 5–15)
BUN: 41 mg/dL — AB (ref 6–20)
CO2: 33 mmol/L — ABNORMAL HIGH (ref 22–32)
CREATININE: 1.31 mg/dL — AB (ref 0.44–1.00)
Calcium: 9.1 mg/dL (ref 8.9–10.3)
Chloride: 103 mmol/L (ref 101–111)
GFR calc Af Amer: 44 mL/min — ABNORMAL LOW (ref >60)
GFR, EST NON AFRICAN AMERICAN: 38 mL/min — AB (ref >60)
Glucose, Bld: 96 mg/dL (ref 65–99)
Potassium: 5.2 mmol/L — ABNORMAL HIGH (ref 3.5–5.1)
SODIUM: 144 mmol/L (ref 135–145)

## 2015-08-03 MED ORDER — SODIUM CHLORIDE 0.9 % IV BOLUS (SEPSIS)
500.0000 mL | Freq: Once | INTRAVENOUS | Status: AC
  Administered 2015-08-03: 500 mL via INTRAVENOUS

## 2015-08-03 MED ORDER — DOXYCYCLINE HYCLATE 100 MG PO TABS
100.0000 mg | ORAL_TABLET | Freq: Two times a day (BID) | ORAL | Status: DC
  Administered 2015-08-03 – 2015-08-04 (×3): 100 mg via ORAL
  Filled 2015-08-03 (×3): qty 1

## 2015-08-03 NOTE — Progress Notes (Signed)
[facility] PROGRESS NOTE    Progress Note   Angel Estrada JQD:[id number] DOB: 06-19-1933 DOA: 07/30/2015 PCP: Thressa Sheller, MD   Brief Narrative:   Angel Estrada is an 82 y.o. female   Assessment/Plan:   Left lower extremity cellulitis: Change vancomycin to doxycycline. Monitor for 24 hours.  Acute on chronic diastolic heart failure: Mild rise in her creatinine will liberalize her diet to see her Lasix Basic metabolic panel in the morning.  COPD: Stable.  Essential  Hypertension: Well controlled continue current regimen.  Controlled diabetes mellitus type 2: continue CBGs before meals and at bedtime.  Chronic kidney disease stage III: Mild rise in creatinine likely due to overdiuresis.    DVT Prophylaxis - Lovenox ordered.  Family Communication: none Disposition Plan: Home am Code Status:     Code Status Orders        Start     Ordered   07/31/15 0249  Full code   Continuous     07/31/15 0248    Code Status History    Date Active Date Inactive Code Status Order ID Comments User Context   This patient has a current code status but no historical code status.        IV Access:    Peripheral IV   Procedures and diagnostic studies:   No results found.   Medical Consultants:    None.  Anti-Infectives:   Anti-infectives    Start     Dose/Rate Route Frequency Ordered Stop   07/31/15 0300  vancomycin (VANCOCIN) 500 mg in sodium chloride 0.9 % 100 mL IVPB     500 mg 100 mL/hr over 60 Minutes Intravenous Daily at bedtime 07/31/15 0248        Subjective:    [name] no pains or complains.  Objective:    Filed Vitals:   08/02/15 0823 08/02/15 1452 08/02/15 2034 08/03/15 0438  BP:  120/94 97/68 106/75  Pulse:  88 78 71  Temp:  98.4 F (36.9 C) 98.2 F (36.8 C) 98.3 F (36.8 C)  TempSrc:  Oral Oral Oral  Resp:  _0 Height:      Weight:    56.11 kg (123 lb 11.2 oz)  SpO2: 94% 93% 94% 97%     Intake/Output Summary (Last 24 hours) at 08/03/15 1134 Last data filed at 08/03/15 0444  Gross per 24 hour  Intake    900 ml  Output    775 ml  Net    125 ml   Filed Weights   08/01/15 0542 08/02/15 0518 08/03/15 0438  Weight: 54.704 kg (120 lb 9.6 oz) 55.974 kg (123 lb 6.4 oz) 56.11 kg (123 lb 11.2 oz)    Exam: Gen:  NAD Cardiovascular:  RRR, - JVD Chest and lungs:   Good air movement and clear to auscultation. Abdomen:  Abdomen soft, NT/ND, + BS Extremities:  No lower extremity edema, She relates it feels back to baseline. Not tender or warm to touch.   Data Reviewed:    Labs: Basic Metabolic Panel:  Recent Labs Lab 07/30/15 2116 08/01/15 0451 08/02/15 0505 08/03/15 0458  NA 140 140 143 144  K 3.7 4.0 4.4 5.2*  CL 94* 99* 101 103  CO2 32 32 34* 33*  GLUCOSE 94 97 96 96  BUN 41* 40* 34* 41*  CREATININE 1.47* 1.31* 1.14* 1.31*  CALCIUM 9.3 8.5* 8.9 9.1   GFR Estimated Creatinine Clearance: 30.1 mL/min (by C-G formula based on Cr of 1.31). Liver Function  Tests:  Recent Labs Lab 07/30/15 2116  AST 31  ALT 23  ALKPHOS 86  BILITOT 1.5*  PROT 7.0  ALBUMIN 3.8   No results for input(s): LIPASE, AMYLASE in the last 168 hours. No results for input(s): AMMONIA in the last 168 hours. Coagulation profile  Recent Labs Lab 07/30/15 2200  INR 1.19    CBC:  Recent Labs Lab 07/30/15 2200  WBC 9.1  NEUTROABS 6.9  HGB 15.0  HCT 46.8*  MCV 95.5  PLT 276   Cardiac Enzymes:  Recent Labs Lab 07/30/15 2314 07/31/15 0302 07/31/15 0834 07/31/15 1420  TROPONINI 0.04* 0.04* 0.04* 0.04*   BNP (last 3 results) No results for input(s): PROBNP in the last 8760 hours. CBG: No results for input(s): GLUCAP in the last 168 hours. D-Dimer: No results for input(s): DDIMER in the last 72 hours. Hgb A1c: No results for input(s): HGBA1C in the last 72 hours. Lipid Profile: No results for input(s): CHOL, HDL, LDLCALC, TRIG, CHOLHDL, LDLDIRECT in the last  72 hours. Thyroid function studies: No results for input(s): TSH, T4TOTAL, T3FREE, THYROIDAB in the last 72 hours.  Invalid input(s): FREET3 Anemia work up: No results for input(s): VITAMINB12, FOLATE, FERRITIN, TIBC, IRON, RETICCTPCT in the last 72 hours. Sepsis Labs:  Recent Labs Lab 07/30/15 2125 07/30/15 2200 07/30/15 2323  WBC  --  9.1  --   LATICACIDVEN 1.24  --  0.88   Microbiology Recent Results (from the past 240 hour(s))  Culture, blood (routine x 2)     Status: None (Preliminary result)   Collection Time: 07/30/15  9:10 PM  Result Value Ref Range Status   Specimen Description BLOOD RIGHT ANTECUBITAL  Final   Special Requests BOTTLES DRAWN AEROBIC AND ANAEROBIC 5CC EACH  Final   Culture   Final    NO GROWTH 3 DAYS Performed at [facility]    Report Status PENDING  Incomplete  Culture, blood (routine x 2)     Status: None (Preliminary result)   Collection Time: 07/30/15  9:15 PM  Result Value Ref Range Status   Specimen Description BLOOD LEFT ANTECUBITAL  Final   Special Requests BOTTLES DRAWN AEROBIC AND ANAEROBIC 5CC EACH  Final   Culture   Final    NO GROWTH 3 DAYS Performed at [facility]    Report Status PENDING  Incomplete  Urine culture     Status: None   Collection Time: 07/31/15  2:20 AM  Result Value Ref Range Status   Specimen Description URINE, CLEAN CATCH  Final   Special Requests NONE  Final   Culture   Final    MULTIPLE SPECIES PRESENT, SUGGEST RECOLLECTION Performed at [facility]    Report Status 08/01/2015 FINAL  Final     Medications:   . aspirin EC  81 mg Oral Daily  . citalopram  40 mg Oral Daily  . enoxaparin (LOVENOX) injection  40 mg Subcutaneous Q24H  . sodium chloride  500 mL Intravenous Once  . sodium chloride flush  3 mL Intravenous Q12H  . vancomycin  500 mg Intravenous QHS   Continuous Infusions:   Time spent: 25 min   LOS: 3 days   Charlynne Cousins  [facility] Pager  [phone]  *Please refer to [email or web address], password [email or web address] to get updated schedule on who will round on this patient, as hospitalists switch teams weekly. If 7PM-7AM, please contact night-coverage at [email or web address], password [email or web address] for any overnight needs.  08/03/2015, 11:34 AM

## 2015-08-03 NOTE — Progress Notes (Signed)
Pharmacy Antibiotic Note  Angel Estrada is a 82 y.o. female admitted on 07/30/2015 with LE cellulitis.  Pharmacy has been consulted for Vancomycin dosing, currently on day #4 of 568m IV q24h.  Plan:  Per discussion during multidisciplinary rounds today, plan discharge tomorrow.  Therefore, will defer checking vancomycin trough at this time.  Height: _0  (162.6 cm) Weight: 123 lb 11.2 oz (56.11 kg) IBW/kg (Calculated) : 54.7  Temp (24hrs), Avg:98.3 F (36.8 C), Min:98.2 F (36.8 C), Max:98.4 F (36.9 C)   Recent Labs Lab 07/30/15 2116 07/30/15 2125 07/30/15 2200 07/30/15 2323 08/01/15 0451 08/02/15 0505 08/03/15 0458  WBC  --   --  9.1  --   --   --   --   CREATININE 1.47*  --   --   --  1.31* 1.14* 1.31*  LATICACIDVEN  --  1.24  --  0.88  --   --   --     Estimated Creatinine Clearance: 30.1 mL/min (by C-G formula based on Cr of 1.31).    Allergies  Allergen Reactions  . Lisinopril-Hydrochlorothiazide Other (See Comments)    Pt's PCP took her off medication because of possible kidney damage  . Sulfa Antibiotics Other (See Comments)    Unknown-- childhood allergy   . Latex Rash  . Penicillins Swelling and Rash    Has patient had a PCN reaction causing immediate rash, facial/tongue/throat swelling, SOB or lightheadedness with hypotension: yes    Antimicrobials this admission: Vancomycin 3/28 >>  Microbiology results: 3/27 BCx: NGTD 3/28 UCx: multiple species, needs recollect   Thank you for allowing pharmacy to be a part of this patient's care.  EPeggyann Juba PharmD, BCPS Pager: [phone]/31/2017 11:13 AM

## 2015-08-03 NOTE — Progress Notes (Signed)
Physical Therapy Treatment Patient Details Name: Angel Estrada MRN: [id number] DOB: [birth year] Today's Date: 08/03/2015    History of Present Illness Angel Estrada is a 82 y.o. female adm with bil LE redness and swelling;   doppler negative for DVTpast medical history of CHF,  Diabetes mellitus without complication, Hypertension; COPD, Depression, and Osteoporosis.     PT Comments    Pt in bed on 3.5 lts O2 at rest 100%. Removed O2 sat EOB at rest decreased to 92%.  RA standing decreased to 82% so did not attempt amb on RA.  Reapplied 3 lts and amb in hallway sats decreased to 82%.  Had pt "stop and breath".  Performed purse lip breathing to achieve 90% but took nearly 2 min to achieve.  Continued with amb.  Pt has a quick gait and instructed to decrease her speed to maintain appropriate sat levels.  Pt non compliant and cont to walk too fast.    SATURATION QUALIFICATIONS: (This note is used to comply with regulatory documentation for home oxygen)  Patient Saturations on Room Air at Rest = 92%  Patient Saturations on Room Air while Ambulating = 82%  Patient Saturations on 3 Liters of oxygen while Ambulating = 88%  Please briefly explain why patient needs home oxygen:  Pt requires supplemental oxygen to achieve therapeutic level  Follow Up Recommendations  Home health PT;Supervision for mobility/OOB     Equipment Recommendations       Recommendations for Other Services       Precautions / Restrictions Precautions Precautions: Fall Precaution Comments: monitor sats Restrictions Weight Bearing Restrictions: No    Mobility  Bed Mobility Overal bed mobility: Modified Independent Bed Mobility: Supine to Sit;Sit to Supine           General bed mobility comments: increased time  Transfers Overall transfer level: Needs assistance Equipment used: None Transfers: Sit to/from Stand Sit to Stand: Supervision;Min guard         General transfer comment: close guard  for safety  Ambulation/Gait Ambulation/Gait assistance: Min assist Ambulation Distance (Feet): 82 Feet Assistive device: None Gait Pattern/deviations: Step-through pattern;Decreased stride length;Drifts right/left     General Gait Details: pt declines the suggestion of a walker.  Required VC's to decreased gait speed and perform purse lip breathing.  VC's to decrease gait speed to match O2 sats.     Stairs            Wheelchair Mobility    Modified Rankin (Stroke Patients Only)       Balance                                    Cognition Arousal/Alertness: Awake/alert Behavior During Therapy: WFL for tasks assessed/performed Overall Cognitive Status: Within Functional Limits for tasks assessed                      Exercises      General Comments        Pertinent Vitals/Pain Pain Assessment: No/denies pain    Home Living                      Prior Function            PT Goals (current goals can now be found in the care plan section) Progress towards PT goals: Progressing toward goals    Frequency  Min 3X/week  PT Plan Current plan remains appropriate    Co-evaluation             End of Session Equipment Utilized During Treatment: Gait belt;Oxygen Activity Tolerance: Patient tolerated treatment well Patient left: in bed;with call bell/phone within reach;with bed alarm set     Time: 1425-1445 PT Time Calculation (min) (ACUTE ONLY): 20 min  Charges:  $Gait Training: 8-22 mins                    G Codes:      Rica Koyanagi  PTA [facility]  Acute  Rehab Pager      [phone]

## 2015-08-03 NOTE — Care Management Important Message (Signed)
Important Message  Patient Details  Name: Angel Estrada MRN: [id number] Date of Birth: 04/13/1934   Medicare Important Message Given:  Yes    Camillo Flaming 08/03/2015, 9:04 AMImportant Message  Patient Details  Name: Angel Estrada MRN: [id number] Date of Birth: Jun 13, 1933   Medicare Important Message Given:  Yes    Camillo Flaming 08/03/2015, 9:03 AMImportant Message  Patient Details  Name: Angel Estrada MRN: [id number] Date of Birth: Oct 02, 1933   Medicare Important Message Given:  Yes    Camillo Flaming 08/03/2015, 9:03 AM

## 2015-08-03 NOTE — Care Management Note (Signed)
Case Management Note  Patient Details  Name: Kaelani Kendrick MRN: [id number] Date of Birth: [birth year]  Subjective/Objective:  Patient qualifies for home 02 [facility] dme rep aware-await home 02 orders. MD notified.                  Action/Plan:   Expected Discharge Date:                  Expected Discharge Plan:  Home/Self Care  In-House Referral:     Discharge planning Services  CM Consult  Post Acute Care Choice:  Durable Medical Equipment Choice offered to:     DME Arranged:    DME Agency:     HH Arranged:  Patient Refused [place] Agency:     Status of Service:  In process, will continue to follow  Medicare Important Message Given:  Yes Date Medicare IM Given:    Medicare IM give by:    Date Additional Medicare IM Given:    Additional Medicare Important Message give by:     If discussed at [place] of Stay Meetings, dates discussed:    Additional Comments:  Dessa Phi, RN 08/03/2015, 1:55 PM

## 2015-08-04 LAB — BASIC METABOLIC PANEL
ANION GAP: 9 (ref 5–15)
BUN: 40 mg/dL — ABNORMAL HIGH (ref 6–20)
CO2: 35 mmol/L — ABNORMAL HIGH (ref 22–32)
Calcium: 9.3 mg/dL (ref 8.9–10.3)
Chloride: 100 mmol/L — ABNORMAL LOW (ref 101–111)
Creatinine, Ser: 1.19 mg/dL — ABNORMAL HIGH (ref 0.44–1.00)
GFR, EST AFRICAN AMERICAN: 49 mL/min — AB (ref >60)
GFR, EST NON AFRICAN AMERICAN: 42 mL/min — AB (ref >60)
Glucose, Bld: 90 mg/dL (ref 65–99)
POTASSIUM: 4.7 mmol/L (ref 3.5–5.1)
SODIUM: 144 mmol/L (ref 135–145)

## 2015-08-04 MED ORDER — DOXYCYCLINE HYCLATE 100 MG PO TABS: 100.0000 mg | ORAL_TABLET | Freq: Two times a day (BID) | ORAL | Status: DC

## 2015-08-04 MED ORDER — FUROSEMIDE 40 MG PO TABS: 40.0000 mg | ORAL_TABLET | Freq: Every day | ORAL | Status: DC

## 2015-08-04 NOTE — Care Management Note (Signed)
Case Management Note  Patient Details  Name: Angel Estrada MRN: [id number] Date of Birth: Nov 22, 1933  Subjective/Objective:     CHF, COPD               Action/Plan: Discharge Planning: AVS reviewed: NCM spoke to pt at bedside. Contacted [facility] DME rep for oxygen for home. Portable tank in the room. Pt declines HH. States she is not homebound. She still drives. States she is ambulatory with DME. Her significant other at home to assist with care.   PCP-  Thressa Sheller MD   Expected Discharge Date:  08/04/2015            Expected Discharge Plan:  Home/Self Care  In-House Referral:  NA  Discharge planning Services  CM Consult  Post Acute Care Choice:  Durable Medical Equipment Choice offered to:  NA  DME Arranged:  Oxygen DME Agency:  [place]:  Patient Refused [place] Agency:  NA  Status of Service:  Completed, signed off  Medicare Important Message Given:  Yes Date Medicare IM Given:    Medicare IM give by:    Date Additional Medicare IM Given:    Additional Medicare Important Message give by:     If discussed at [place] of Stay Meetings, dates discussed:    Additional Comments:  Erenest Rasher, RN 08/04/2015, 1:20 PM

## 2015-08-04 NOTE — Progress Notes (Signed)
Completed D/C teaching. Prescriptions given. Patient received home O2 before leaving. Patient will be D/C home with family in stable condition.

## 2015-08-04 NOTE — Discharge Summary (Signed)
Physician Discharge Summary  Angel Estrada PPI:[id number] DOB: 09-Aug-1933 DOA: 07/30/2015  PCP: Thressa Sheller, MD  Admit date: 07/30/2015 Discharge date: 08/04/2015  Time spent: 35 minutes  Recommendations for Outpatient Follow-up:  1. Follow-up with primary care doctor in 2-4 weeks.   Discharge Diagnoses:  Active Problems:   Hypertension   COPD (chronic obstructive pulmonary disease) (HCC)   CHF (congestive heart failure) (HCC)   Diabetes mellitus (HCC)   Acute on chronic diastolic CHF (congestive heart failure), NYHA class 1 (HCC)   CHF exacerbation ([place])   Discharge Condition: stable  Diet recommendation: low sodium fluid restricted  Filed Weights   08/02/15 0518 08/03/15 0438 08/04/15 0522  Weight: 55.974 kg (123 lb 6.4 oz) 56.11 kg (123 lb 11.2 oz) 56.1 kg (123 lb 10.9 oz)    History of present illness:  82 year old with past medical history of heart failure presents with leg swelling and progressive erythema.  Hospital Course:  Left lower extremity cellulitis: She was admitted and started on IV vancomycin erythema take several days to improve, one she defervesced and the swelling was improved along with the erythema she was changed to doxycycline. She was monitored for 24 hours and doxycycline and discharged. Initially was done that showed no DVT. She will continued oxide as an outpatient for 3 more days.  Acute on chronic diastolic heart failure: She was started on IV diuresis, she diureses well she had a mild increase in her creatinine and her Lasix was held for 24 hours. Continue current dose of Lasix follow strict I's and O's at home and daily weights. She will follow-up with her cardiologist as an outpatient.  COPD: Asymptomatic no changes were made.  Essential hypertension: No changes were made to her regimen.  Controlled diabetes mellitus type 2: No changes were made to her regimen.  Chronic kidney disease stage III: Her creatinine ramain stable no  changes.  Procedures:  Lower ext doppler no DVT  ECHo  CXR  Consultations:  none  Discharge Exam: Filed Vitals:   08/03/15 2024 08/04/15 0522  BP: 109/81 100/65  Pulse: 67 65  Temp: 98.2 F (36.8 C) 99 F (37.2 C)  Resp: 17 19    General: A&O x3 Cardiovascular: RRR Respiratory: good air movement CTA B/L  Discharge Instructions   Discharge Instructions    Diet - low sodium heart healthy    Complete by:  As directed      Increase activity slowly    Complete by:  As directed           Current Discharge Medication List    START taking these medications   Details  doxycycline (VIBRA-TABS) 100 MG tablet Take 1 tablet (100 mg total) by mouth every 12 (twelve) hours. Qty: 6 tablet, Refills: 0      CONTINUE these medications which have CHANGED   Details  furosemide (LASIX) 40 MG tablet Take 1-1.5 tablets (40-60 mg total) by mouth daily. 60 mg every morning and 40 mg every afternoon Qty: 30 tablet, Refills: 0      CONTINUE these medications which have NOT CHANGED   Details  citalopram (CELEXA) 40 MG tablet Take 40 mg by mouth daily. Refills: 0    fluticasone (FLONASE) 50 MCG/ACT nasal spray Place 2 sprays into both nostrils daily.    nystatin-triamcinolone (MYCOLOG II) cream Apply 1 application topically 2 (two) times daily as needed (rash). Reported on 07/30/2015 Refills: 0    PROAIR HFA 108 (90 Base) MCG/ACT inhaler Inhale 2 puffs into the lungs  every 6 (six) hours as needed for wheezing or shortness of breath.  Refills: 1    triamcinolone cream (KENALOG) 0.1 % Apply 1 application topically 2 (two) times daily as needed (skin irritation).       Allergies  Allergen Reactions  . Lisinopril-Hydrochlorothiazide Other (See Comments)    Pt's PCP took her off medication because of possible kidney damage  . Sulfa Antibiotics Other (See Comments)    Unknown-- childhood allergy   . Latex Rash  . Penicillins Swelling and Rash    Has patient had a PCN  reaction causing immediate rash, facial/tongue/throat swelling, SOB or lightheadedness with hypotension: yes      The results of significant diagnostics from this hospitalization (including imaging, microbiology, ancillary and laboratory) are listed below for reference.    Significant Diagnostic Studies: Dg Chest 2 View  07/30/2015  CLINICAL DATA:  82 year old female with hypoxia. Cellulitis of the lower extremities. EXAM: CHEST  2 VIEW COMPARISON:  None. FINDINGS: Two views of the chest demonstrate large focal area of opacity at the left lung base which appears to contain bowel gas and most likely represents large hiatal or diaphragmatic hernia. There is associated compressive atelectasis of the left lung base. Pneumonia is not excluded. The right lung is clear. There is emphysematous changes of the spine. Trace right pleural effusion noted. There is silhouetting of the left cardiac border. There is thoracolumbar scoliosis. No acute fracture. IMPRESSION: Large bowel containing hernia in the left lung base. Underlying infiltrate is not excluded. Electronically Signed   By: Anner Crete M.D.   On: 07/30/2015 20:32    Microbiology: Recent Results (from the past 240 hour(s))  Culture, blood (routine x 2)     Status: None (Preliminary result)   Collection Time: 07/30/15  9:10 PM  Result Value Ref Range Status   Specimen Description BLOOD RIGHT ANTECUBITAL  Final   Special Requests BOTTLES DRAWN AEROBIC AND ANAEROBIC 5CC EACH  Final   Culture   Final    NO GROWTH 3 DAYS Performed at [facility]    Report Status PENDING  Incomplete  Culture, blood (routine x 2)     Status: None (Preliminary result)   Collection Time: 07/30/15  9:15 PM  Result Value Ref Range Status   Specimen Description BLOOD LEFT ANTECUBITAL  Final   Special Requests BOTTLES DRAWN AEROBIC AND ANAEROBIC 5CC EACH  Final   Culture   Final    NO GROWTH 3 DAYS Performed at [facility]    Report Status  PENDING  Incomplete  Urine culture     Status: None   Collection Time: 07/31/15  2:20 AM  Result Value Ref Range Status   Specimen Description URINE, CLEAN CATCH  Final   Special Requests NONE  Final   Culture   Final    MULTIPLE SPECIES PRESENT, SUGGEST RECOLLECTION Performed at [facility]    Report Status 08/01/2015 FINAL  Final     Labs: Basic Metabolic Panel:  Recent Labs Lab 07/30/15 2116 08/01/15 0451 08/02/15 0505 08/03/15 0458 08/04/15 0540  NA 140 140 143 144 144  K 3.7 4.0 4.4 5.2* 4.7  CL 94* 99* 101 103 100*  CO2 32 32 34* 33* 35*  GLUCOSE 94 97 96 96 90  BUN 41* 40* 34* 41* 40*  CREATININE 1.47* 1.31* 1.14* 1.31* 1.19*  CALCIUM 9.3 8.5* 8.9 9.1 9.3   Liver Function Tests:  Recent Labs Lab 07/30/15 2116  AST 31  ALT 23  ALKPHOS 86  BILITOT 1.5*  PROT 7.0  ALBUMIN 3.8   No results for input(s): LIPASE, AMYLASE in the last 168 hours. No results for input(s): AMMONIA in the last 168 hours. CBC:  Recent Labs Lab 07/30/15 2200  WBC 9.1  NEUTROABS 6.9  HGB 15.0  HCT 46.8*  MCV 95.5  PLT 276   Cardiac Enzymes:  Recent Labs Lab 07/30/15 2314 07/31/15 0302 07/31/15 0834 07/31/15 1420  TROPONINI 0.04* 0.04* 0.04* 0.04*   BNP: BNP (last 3 results)  Recent Labs  07/30/15 2200  BNP 764.5*    ProBNP (last 3 results) No results for input(s): PROBNP in the last 8760 hours.  CBG: No results for input(s): GLUCAP in the last 168 hours.     Signed:  Charlynne Cousins MD.  [facility] 08/04/2015, 10:00 AM

## 2015-08-05 LAB — CULTURE, BLOOD (ROUTINE X 2)
CULTURE: NO GROWTH
Culture: NO GROWTH
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
## 2015-09-06 ENCOUNTER — Ambulatory Visit (INDEPENDENT_AMBULATORY_CARE_PROVIDER_SITE_OTHER): Payer: Medicare Other | Admitting: Cardiovascular Disease

## 2015-09-06 ENCOUNTER — Encounter: Payer: Self-pay | Admitting: Cardiovascular Disease

## 2015-09-06 VITALS — BP 130/80 | HR 95 | Ht 64.0 in | Wt 117.0 lb

## 2015-09-06 DIAGNOSIS — I509 Heart failure, unspecified: Secondary | ICD-10-CM | POA: Diagnosis not present

## 2015-09-06 DIAGNOSIS — I272 Other secondary pulmonary hypertension: Secondary | ICD-10-CM

## 2015-09-06 MED ORDER — SILDENAFIL CITRATE 20 MG PO TABS: 20.0000 mg | ORAL_TABLET | Freq: Three times a day (TID) | ORAL | Status: DC

## 2015-09-06 NOTE — Patient Instructions (Addendum)
Medication Instructions:  Your physician has recommended you make the following change in your medication:  1-Start Revatio 20 mg by mouth three times daily  Labwork: NONE  Testing/Procedures: Your physician has requested that you have a lower and upper extremity arterial duplex. This test is an ultrasound of the arteries in the legs and arms. It looks at arterial blood flow in the legs and arms. Allow one hour for Lower and Upper Arterial scans. There are no restrictions or special instructions  Follow-Up: Your physician wants you to follow-up in 10 weeks with Dr. Johnsie Cancel.  You have been referred to pulmonary doctor for pulmonary hypertension   If you need a refill on your cardiac medications before your next appointment, please call your pharmacy.

## 2015-09-07 ENCOUNTER — Telehealth: Payer: Self-pay

## 2015-09-07 NOTE — Telephone Encounter (Signed)
Prior auth for Sildenafil 30m obtained from [facility]. Good through 03/09/2016. [id number]

## 2015-09-10 ENCOUNTER — Other Ambulatory Visit: Payer: Self-pay | Admitting: Cardiovascular Disease

## 2015-09-10 DIAGNOSIS — R0989 Other specified symptoms and signs involving the circulatory and respiratory systems: Secondary | ICD-10-CM

## 2015-09-17 ENCOUNTER — Ambulatory Visit (HOSPITAL_COMMUNITY)
Admission: RE | Admit: 2015-09-17 | Discharge: 2015-09-17 | Disposition: A | Discharge status: Home / Self Care | Payer: Medicare Other | Source: Ambulatory Visit | Attending: Cardiology | Admitting: Cardiology

## 2015-09-17 ENCOUNTER — Other Ambulatory Visit: Payer: Self-pay | Admitting: Cardiovascular Disease

## 2015-09-17 DIAGNOSIS — I731 Thromboangiitis obliterans [Buerger's disease]: Secondary | ICD-10-CM | POA: Insufficient documentation

## 2015-09-17 DIAGNOSIS — E119 Type 2 diabetes mellitus without complications: Secondary | ICD-10-CM | POA: Insufficient documentation

## 2015-09-17 DIAGNOSIS — R0989 Other specified symptoms and signs involving the circulatory and respiratory systems: Secondary | ICD-10-CM | POA: Insufficient documentation

## 2015-09-17 DIAGNOSIS — I509 Heart failure, unspecified: Secondary | ICD-10-CM | POA: Diagnosis not present

## 2015-09-17 DIAGNOSIS — I110 Hypertensive heart disease with heart failure: Secondary | ICD-10-CM | POA: Diagnosis not present

## 2015-11-27 ENCOUNTER — Institutional Professional Consult (permissible substitution): Payer: Medicare Other | Admitting: Pulmonary Disease

## 2015-12-05 ENCOUNTER — Encounter: Payer: Self-pay | Admitting: Cardiovascular Disease

## 2015-12-20 ENCOUNTER — Other Ambulatory Visit: Payer: Self-pay

## 2015-12-20 ENCOUNTER — Telehealth: Payer: Self-pay

## 2015-12-20 DIAGNOSIS — J42 Unspecified chronic bronchitis: Secondary | ICD-10-CM
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
## 2015-12-20 NOTE — Telephone Encounter (Signed)
Called patient to let her know that she needs to follow-up with Pulmonology. Patient stated that should would call today to reschedule. Informed patient that she can keep her appointment tomorrow with Dr. Johnsie Cancel. Patient verbalized understanding.

## 2015-12-20 NOTE — Progress Notes (Signed)
I don't mind seeing her but she needs PFTls and f/u with them as they will follow her pulmonary hypertension

## 2015-12-21 ENCOUNTER — Encounter: Payer: Self-pay | Admitting: Cardiovascular Disease

## 2015-12-21 ENCOUNTER — Ambulatory Visit (INDEPENDENT_AMBULATORY_CARE_PROVIDER_SITE_OTHER): Payer: Medicare Other | Admitting: Cardiovascular Disease

## 2015-12-21 ENCOUNTER — Encounter (INDEPENDENT_AMBULATORY_CARE_PROVIDER_SITE_OTHER): Payer: Self-pay

## 2015-12-21 VITALS — BP 110/60 | HR 99 | Ht 62.0 in | Wt 107.8 lb

## 2015-12-21 DIAGNOSIS — I272 Other secondary pulmonary hypertension: Secondary | ICD-10-CM

## 2015-12-21 NOTE — Patient Instructions (Signed)

## 2016-02-13 ENCOUNTER — Institutional Professional Consult (permissible substitution): Payer: Medicare Other | Admitting: Pulmonary Disease

## 2016-02-13 ENCOUNTER — Encounter: Payer: Self-pay | Admitting: Pulmonary Disease

## 2016-02-13 ENCOUNTER — Other Ambulatory Visit (INDEPENDENT_AMBULATORY_CARE_PROVIDER_SITE_OTHER): Payer: Medicare Other

## 2016-02-13 ENCOUNTER — Ambulatory Visit (INDEPENDENT_AMBULATORY_CARE_PROVIDER_SITE_OTHER): Payer: Medicare Other | Admitting: Pulmonary Disease

## 2016-02-13 ENCOUNTER — Encounter (INDEPENDENT_AMBULATORY_CARE_PROVIDER_SITE_OTHER): Payer: Medicare Other | Admitting: Pulmonary Disease

## 2016-02-13 VITALS — BP 88/52 | HR 61 | Ht 62.0 in | Wt 110.0 lb

## 2016-02-13 DIAGNOSIS — J42 Unspecified chronic bronchitis: Secondary | ICD-10-CM

## 2016-02-13 DIAGNOSIS — M4120 Other idiopathic scoliosis, site unspecified: Secondary | ICD-10-CM | POA: Diagnosis not present

## 2016-02-13 DIAGNOSIS — R0609 Other forms of dyspnea: Secondary | ICD-10-CM | POA: Diagnosis not present

## 2016-02-13 LAB — PULMONARY FUNCTION TEST
DL/VA % PRED: 78 %
DL/VA: 3.32 ml/min/mmHg/L
DLCO cor % pred: 41 %
DLCO cor: 7.77 ml/min/mmHg
DLCO unc % pred: 45 %
DLCO unc: 8.53 ml/min/mmHg
FEF 25-75 Post: 0.55 L/sec
FEF 25-75 Pre: 0.56 L/sec
FEF2575-%CHANGE-POST: -1 %
FEF2575-%Pred-Post: 46 %
FEF2575-%Pred-Pre: 47 %
FEV1-%Change-Post: -1 %
FEV1-%PRED-PRE: 59 %
FEV1-%Pred-Post: 58 %
FEV1-POST: 0.92 L
FEV1-PRE: 0.94 L
FEV1FVC-%CHANGE-POST: 2 %
FEV1FVC-%Pred-Pre: 97 %
FEV6-%Change-Post: -2 %
FEV6-%PRED-PRE: 62 %
FEV6-%Pred-Post: 61 %
FEV6-POST: 1.23 L
FEV6-PRE: 1.26 L
FEV6FVC-%Change-Post: 2 %
FEV6FVC-%PRED-POST: 106 %
FEV6FVC-%PRED-PRE: 103 %
FVC-%CHANGE-POST: -4 %
FVC-%PRED-POST: 57 %
FVC-%PRED-PRE: 60 %
FVC-POST: 1.23 L
FVC-PRE: 1.29 L
POST FEV6/FVC RATIO: 100 %
PRE FEV1/FVC RATIO: 72 %
Post FEV1/FVC ratio: 74 %
Pre FEV6/FVC Ratio: 97 %
RV % PRED: 71 %
RV: 1.57 L
TLC % PRED: 62 %
TLC: 2.79 L

## 2016-02-13 LAB — C-REACTIVE PROTEIN: CRP: 0.2 mg/dL — ABNORMAL LOW (ref 0.5–20.0)

## 2016-02-13 LAB — SEDIMENTATION RATE: SED RATE: 10 mm/h (ref 0–30)

## 2016-02-13 MED ORDER — BUDESONIDE-FORMOTEROL FUMARATE 160-4.5 MCG/ACT IN AERO: 2.0000 | INHALATION_SPRAY | Freq: Two times a day (BID) | RESPIRATORY_TRACT | 6 refills | Status: DC

## 2016-02-13 MED ORDER — BUDESONIDE-FORMOTEROL FUMARATE 160-4.5 MCG/ACT IN AERO: 2.0000 | INHALATION_SPRAY | Freq: Two times a day (BID) | RESPIRATORY_TRACT | 0 refills | Status: DC

## 2016-02-13 NOTE — Assessment & Plan Note (Addendum)
Patient is being seen for " pulmonary hypertension" based on 2-D echo. 2DEcho (08/2015) PASP 61 mm Hg Patient also has chronic exertional dyspnea. Pulmonary hypertension is multifactorial: 1. COPD. Please see  separate discussion. 2. Left heart diastolic heart failure. She is being seen by cardiology. 3. Patient denies any history of blood clots. 4. Possible OSA.  5. Patient has scoliosis which is making exertional dyspnea worse, not necessarily contribution into the pulmonary hypertension. 6. Possible CTD  Plan: 1. We'll treat COPD. 2. Follow-up with cardiology regarding heart issues. Cont diuresis. No LHC or RHC being considered at this point.  3. Plan for a VQ scan but she wanted to hold off.  4. Plan for ONO > if abnormal, she will need a sleep study to determine if she has OSA or not.  5. CTD blood work.

## 2016-02-13 NOTE — Patient Instructions (Signed)
It was a pleasure taking care of you today!  You are diagnosed with Chronic Obstructive Pulmonary Disease or COPD.  COPD is a preventable and treatable disease that makes it difficult to empty air out of the lungs (airflow obstruction).  This can lead to shortness of breath.   Sometimes, when you have a lung infection, this can make your breathing worse, and will cause you to have a COPD flare-up or an acute exacerbation of COPD. Please call your primary care doctor or the office if you are having a COPD flare-up.   Smoking makes COPD worse.   Make sure you use your medications for COPD -- Maintenance medications : Symbicort 160/4.5 Puffs 2x/day.   Rescue medications: Albuterol 2 puffs every 4 hours as needed for shortness of breath.   Please rinse your mouth each time you use your maintenance medication.  Please call the office if you are having issues with your medications  We will do blood tests today. We'll get a chest CT scan.  Return to clinic in 6-8 weeks with Dr. Corrie Dandy or NP

## 2016-02-13 NOTE — Assessment & Plan Note (Signed)
20-pack-year smoking history, quit in her 36s. PFT (02/13/2016): FEV1 FVC ratio 72%, FEV1 0.94 or 59% predicted. No significant bronchodilator response. Lung volumes show restriction with TLC at 2.79 or 62% predicted. Diffusion capacity is moderately diminished at 45%. Flow volume loop suggestive of COPD changes. Chest x-ray (April, 2017) COPD changes. Patient likely has moderate COPD.  Plan : We extensively discussed the diagnosis, pathophysiology, and medications  for COPD.   Patient will need : Chest ct scan > to better look at the lung parenchyma as well as degree of scoliosis. Alpha one antitrypsin level (if not yet done) 6MWT > on f/u Nocturnal oximetry  We will start patient on  Symbicort, 160/4.5, 2 puffs twice a day. Patient was instructed to rinse mouth each time he/she uses Symbicort.  Influenza vaccine -- patient will get from her primary care doctor Pneumococcal vaccine --pt  will get from her primary care doctor.  Patient was instructed to call the office if he/she has issues with the medications.   Patient was instructed to call the office if he/she is having issues with shortness of breath (i.e. COPD exacerbation).   Patient was instructed to call the office if with increased shortness of breath, cough, sputum production, fevers. Patient may need to be seen at the office for work-up.   Counseled patient on the importance of NOT smoking.
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
## 2016-02-13 NOTE — Assessment & Plan Note (Signed)
Patient has scoliosis. No active intervention. Scoliosis is making her exertional dyspnea worse. Plan to get chest CT scan to better look at her for vertebrae as well.

## 2016-02-14 LAB — ANA: Anti Nuclear Antibody(ANA): NEGATIVE

## 2016-02-14 LAB — RHEUMATOID FACTOR

## 2016-02-14 LAB — ANTI-SCLERODERMA ANTIBODY: Scleroderma (Scl-70) (ENA) Antibody, IgG: <1

## 2016-02-14 LAB — ANTI-DNA ANTIBODY, DOUBLE-STRANDED

## 2016-02-14 LAB — ANTI-JO 1 ANTIBODY, IGG: Anti JO-1: <0.2 AI (ref 0.0–0.9)

## 2016-02-14 LAB — CYCLIC CITRUL PEPTIDE ANTIBODY, IGG: Cyclic Citrullin Peptide Ab: <16 Units

## 2016-02-14 LAB — ANCA SCREEN W REFLEX TITER: ANCA Screen: NEGATIVE

## 2016-02-16 LAB — ALPHA-1 ANTITRYPSIN PHENOTYPE: A1 ANTITRYPSIN: 184 mg/dL (ref 83–199)

## 2016-02-21 ENCOUNTER — Ambulatory Visit (INDEPENDENT_AMBULATORY_CARE_PROVIDER_SITE_OTHER)
Admission: RE | Admit: 2016-02-21 | Discharge: 2016-02-21 | Disposition: A | Discharge status: Home / Self Care | Payer: Medicare Other | Source: Ambulatory Visit | Attending: Pulmonary Disease | Admitting: Pulmonary Disease

## 2016-02-21 DIAGNOSIS — R0609 Other forms of dyspnea: Secondary | ICD-10-CM

## 2016-02-27 NOTE — Progress Notes (Signed)
Tried to call Pt. But it stated his phone number was disconnected  x1

## 2016-03-17 ENCOUNTER — Telehealth: Payer: Self-pay

## 2016-03-17 NOTE — Telephone Encounter (Addendum)
[facility] Received fax from [facility] approval [facility] Sildenafil. [id number] is good through 05/04/2017.

## 2016-03-19 ENCOUNTER — Telehealth: Payer: Self-pay | Admitting: Pulmonary Disease

## 2016-03-19 NOTE — Telephone Encounter (Signed)
Pt asking what needs to be done as far as treating the hernia. Who does she need to follow up with? Please advise Dr Halford Chessman. Thanks.  PATIENT REQUESTS CALL BACK AFTER 3:30PM 03/20/16.  Notes Recorded by Rush Landmark, MD on 02/24/2016 at 6:34 AM EDT pls tell pt ct scan shows COPD/emphysema and a large hernia in her L side of the chest. Part of the left lower lung is compressed by her hernia. These findings will make her SOB.

## 2016-03-20 NOTE — Telephone Encounter (Signed)
   Sorry. I just saw this message.   Pls call pt and tell her, we will tell her PCP about the hernia.  She may need surgery.  She may need to see surgeon. Given her medical issues, she may not tolerate surgery.   LBPU > can we pls send Chest ct scan report (last one) to PCP?   I will try to call her up today as well. Thanks.   Monica Becton, MD 03/20/2016, 5:55 AM [facility] Pager [phone] After 3 pm or if no answer, call [phone]

## 2016-03-20 NOTE — Telephone Encounter (Signed)
I have faxed copy of the CT Chest to her PCP, Dr Alyson Ingles  I called the pt and NA, no option to leave msg, [facility]

## 2016-03-20 NOTE — Telephone Encounter (Signed)
ATC pt, no answer and no option to LM Per Ashtyn's conversation with patient on 11.15.17, she would like call back after 3:30pm today 11.16.17 [facility]

## 2016-03-21 NOTE — Telephone Encounter (Signed)
Lmtcb. Will await call back

## 2016-03-24 NOTE — Telephone Encounter (Signed)
lmtcb X2 for pt

## 2016-03-25 NOTE — Telephone Encounter (Signed)
lmtcb X3 for patient.  Will close encounter per triage protocol.

## 2016-04-03 ENCOUNTER — Ambulatory Visit: Payer: Medicare Other | Admitting: Pulmonary Disease

## 2016-05-12 ENCOUNTER — Telehealth: Payer: Self-pay

## 2016-05-12 NOTE — Telephone Encounter (Signed)
lmom tcb x1   Received a fax from [employer] that stated they have been trying to get in contact with pt. To schedule her for her ono. Tried to call pt. To follow up on this. Will await her call back

## 2016-07-17 ENCOUNTER — Encounter: Payer: Self-pay | Admitting: Cardiovascular Disease

## 2016-07-28 ENCOUNTER — Encounter: Payer: Self-pay | Admitting: Cardiovascular Disease

## 2016-07-28 ENCOUNTER — Ambulatory Visit (INDEPENDENT_AMBULATORY_CARE_PROVIDER_SITE_OTHER): Payer: Medicare Other | Admitting: Cardiovascular Disease

## 2016-07-28 ENCOUNTER — Encounter (INDEPENDENT_AMBULATORY_CARE_PROVIDER_SITE_OTHER): Payer: Self-pay

## 2016-07-28 VITALS — BP 102/76 | HR 76 | Ht 65.0 in | Wt 103.8 lb

## 2016-07-28 DIAGNOSIS — I509 Heart failure, unspecified: Secondary | ICD-10-CM | POA: Diagnosis not present

## 2016-07-28 MED ORDER — SILDENAFIL CITRATE 20 MG PO TABS: 20.0000 mg | ORAL_TABLET | Freq: Three times a day (TID) | ORAL | 11 refills | Status: DC

## 2016-07-28 NOTE — Patient Instructions (Signed)
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
## 2016-10-06 ENCOUNTER — Emergency Department (HOSPITAL_COMMUNITY)
Admission: EM | Admit: 2016-10-06 | Discharge: 2016-10-06 | Disposition: A | Discharge status: Home / Self Care | Payer: Medicare Other | Attending: Emergency Medicine | Admitting: Emergency Medicine

## 2016-10-06 ENCOUNTER — Encounter (HOSPITAL_COMMUNITY): Payer: Self-pay | Admitting: *Deleted

## 2016-10-06 DIAGNOSIS — Z79899 Other long term (current) drug therapy: Secondary | ICD-10-CM | POA: Diagnosis not present

## 2016-10-06 DIAGNOSIS — J449 Chronic obstructive pulmonary disease, unspecified: Secondary | ICD-10-CM | POA: Diagnosis not present

## 2016-10-06 DIAGNOSIS — I5033 Acute on chronic diastolic (congestive) heart failure: Secondary | ICD-10-CM | POA: Diagnosis not present

## 2016-10-06 DIAGNOSIS — Z742 Need for assistance at home and no other household member able to render care: Secondary | ICD-10-CM | POA: Insufficient documentation

## 2016-10-06 DIAGNOSIS — Z91040 Latex allergy status: Secondary | ICD-10-CM | POA: Diagnosis not present

## 2016-10-06 DIAGNOSIS — M79661 Pain in right lower leg: Secondary | ICD-10-CM | POA: Insufficient documentation

## 2016-10-06 DIAGNOSIS — E119 Type 2 diabetes mellitus without complications: Secondary | ICD-10-CM | POA: Insufficient documentation

## 2016-10-06 DIAGNOSIS — I110 Hypertensive heart disease with heart failure: Secondary | ICD-10-CM | POA: Diagnosis not present

## 2016-10-06 NOTE — ED Notes (Signed)
Patient is hypotensive per EMS and continues to be hypotensive in the ED.  MD aware
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
## 2016-10-06 NOTE — ED Notes (Signed)
Patient is alert and oriented x3.  She was given DC instructions and follow up visit instructions.  Patient gave verbal understanding. She was DC ambulatory under her own power to home.  V/S stable.  He was not showing any signs of distress on DC 
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
## 2016-10-06 NOTE — ED Notes (Signed)
Leveda Anna, a good friend, will be coming to pick up patient in about 20 mins or so.

## 2016-10-06 NOTE — Discharge Instructions (Signed)
I recommend continuing to take your home medications as prescribed and refraining from taking your prescriptions of Spironolactone and Lasix until your follow up with your primary care provider.  Schedule an appointment with your primary care provider for follow up within the next week.

## 2016-10-06 NOTE — ED Provider Notes (Signed)
[facility] DEPT Provider Note   CSN: [id number] Arrival date & time: 10/06/16  1443     History   Chief Complaint Chief Complaint  Patient presents with  . Leg Pain    HPI Angel Estrada is a 83 y.o. female.  HPI   Patient is a 83 year old female with history of CHF (EF 60-65%), diabetes, hypertension and COPD who presents to the ED via EMS from home. Patient reports her remain at home had called EMS due to being concerned about her low blood pressure that was reported last week. Patient reports she tripped and fell resulting in scraping her right lower leg which resulted in her roommate calling EMS. Patient states her blood pressure was in 15A systolic when EMS took it last week. She notes she is currently not on any blood pressure medications and states she was previously on spironolactone but was advised to stop taking it a few months ago by her physician due to her low blood pressures. Patient denies any recent changes in her home medications. She also states she has not been using her home oxygen for the past few months due to not wanting to have extra [place] in her house. Patient denies any pain or complaints at this time and states he only reason why she is in the ED today because her roommate made her come. Patient states she ambulates at home using a cane. Denies any other recent fall, trauma or head injuries. Denies use of anticoagulants. Denies fever, chills, headache, lightheadedness, dizziness, chest pain, shortness of breath, cough, abdominal pain, nausea, vomiting, diarrhea, urinary symptoms, numbness, tingling, weakness, leg swelling, bleeding, redness, drainage.  Past Medical History:  Diagnosis Date  . CHF (congestive heart failure) ([place])   . COPD (chronic obstructive pulmonary disease) ([place])   . Depression   . Diabetes mellitus without complication ([place])   . Hypertension   . Osteoporosis     Patient Active Problem List   Diagnosis Date Noted  . Exertional  dyspnea 02/13/2016  . Idiopathic scoliosis 02/13/2016  . CHF exacerbation ([place]) 07/31/2015  . Hypertension 07/30/2015  . COPD (chronic obstructive pulmonary disease) ([place]) 07/30/2015  . CHF (congestive heart failure) ([place]) 07/30/2015  . Diabetes mellitus ([place]) 07/30/2015  . Acute on chronic diastolic CHF (congestive heart failure), NYHA class 1 ([place]) 07/30/2015    Past Surgical History:  Procedure Laterality Date  . CATARACT EXTRACTION Bilateral   . US ECHOCARDIOGRAPHY      OB History    No data available       Home Medications    Prior to Admission medications   Medication Sig Start Date End Date Taking? Authorizing Provider  budesonide-formoterol (SYMBICORT) 160-4.5 MCG/ACT inhaler Inhale 2 puffs into the lungs 2 (two) times daily.    [provider]  citalopram (CELEXA) 40 MG tablet Take 40 mg by mouth daily. 07/13/15   [provider]  fluticasone (FLONASE) 50 MCG/ACT nasal spray Place 2 sprays into both nostrils daily.    [provider]  furosemide (LASIX) 40 MG tablet Take 60 mg by mouth daily. 07/09/15   [provider]  metolazone (ZAROXOLYN) 5 MG tablet Take 1 tablet by mouth as needed. 12/23/15   [provider]  nystatin-triamcinolone (MYCOLOG II) cream Apply 1 application topically 2 (two) times daily as needed (rash). Reported on 07/30/2015 07/02/15   [provider]  PROAIR HFA 108 [phone] Base) MCG/ACT inhaler Inhale 2 puffs into the lungs every 6 (six) hours as needed for wheezing or  shortness of breath.  07/02/15   [provider]  sildenafil (REVATIO) 20 MG tablet Take 1 tablet (20 mg total) by mouth 3 (three) times daily. 07/28/16   Josue Hector, MD  spironolactone (ALDACTONE) 25 MG tablet Take 25 mg by mouth daily. 08/30/15   [provider]  triamcinolone cream (KENALOG) 0.1 % Apply 1 application topically 2 (two) times daily as needed (skin irritation).    [provider]    Family  History Family History  Problem Relation Age of Onset  . Diabetes type II Mother   . Hypertension Mother   . Brain cancer Father   . Hypertension Father   . Diabetes type II Brother   . Stroke Other     Social History Social History  Substance Use Topics  . Smoking status: Never Smoker  . Smokeless tobacco: Never Used  . Alcohol use No     Allergies   Penicillins; Lisinopril-hydrochlorothiazide; Sulfa antibiotics; and Latex   Review of Systems Review of Systems  Skin: Positive for wound.  Hematological:       Low blood pressure  All other systems reviewed and are negative.    Physical Exam Updated Vital Signs BP 117/83 (BP Location: Right Arm)   Pulse 77   Temp 97.7 F (36.5 C) (Oral)   Ht _0  (1.6 m)   Wt 61.2 kg (135 lb)   SpO2 96%   BMI 23.91 kg/m   Physical Exam  Constitutional: She is oriented to person, place, and time. She appears well-developed and well-nourished. No distress.  HENT:  Head: Normocephalic and atraumatic.  Mouth/Throat: Oropharynx is clear and moist. No oropharyngeal exudate.  Eyes: Conjunctivae and EOM are normal. Right eye exhibits no discharge. Left eye exhibits no discharge. No scleral icterus.  Neck: Normal range of motion. Neck supple.  Cardiovascular: Normal rate, regular rhythm, normal heart sounds and intact distal pulses.   Pulmonary/Chest: Effort normal and breath sounds normal. No respiratory distress. She has no wheezes. She has no rales. She exhibits no tenderness.  Abdominal: Soft. Bowel sounds are normal. She exhibits no distension and no mass. There is no tenderness. There is no rebound and no guarding. No hernia.  Musculoskeletal: Normal range of motion. She exhibits no edema or tenderness.  Neurological: She is alert and oriented to person, place, and time.  Skin: Skin is warm and dry. She is not diaphoretic.  3 small superficial healing abrasions present to right anterior shin with small amount of dried blood to  surrounding wounds. No surrounding swelling, erythema, warmth, drainage.   Nursing note and vitals reviewed.    ED Treatments / Results  Labs (all labs ordered are listed, but only abnormal results are displayed) Labs Reviewed - No data to display  EKG  EKG Interpretation None       Radiology No results found.  Procedures Procedures (including critical care time)  Medications Ordered in ED Medications - No data to display   Initial Impression / Assessment and Plan / ED Course  I have reviewed the triage vital signs and the nursing notes.  Pertinent labs & imaging results that were available during my care of the patient were reviewed by me and considered in my medical decision making (see chart for details).     Patient presents from home due to her remain calling EMS for concern of her leg wound from a fall last week and reported low blood pressure at home. Patient denies any complaints. Denies any other  recent falls or injury. Reports she has been taking her home medication as prescribed but states she has not been taking her prescription of Lasix and spironolactone for the past few months as advised by her PCP due to her history of low blood pressure. VSS. Exam revealed frail elderly-appearing female with multiple small superficial healing abrasions to right anterior shin, no evidence of secondary cellulitis. No active bleeding. Remaining exam unremarkable. Discussed pt with Dr. Rex Kras who also evaluated pt in the ED. Plan to consult case management and social work due to pt with reported unsafe home environment. Pt d/c home with outpatient home follow up and advised to reschedule her appointment with her PCP for follow-up evaluation within the next week.  Final Clinical Impressions(s) / ED Diagnoses   Final diagnoses:  Need for home health care    New Prescriptions New Prescriptions   No medications on file     Nona Dell, Hershal Coria 10/06/16 [place], Wenda Overland, MD 10/09/16 [phone]

## 2016-10-06 NOTE — ED Notes (Signed)
Bed: [facility] Expected date:  Expected time:  Means of arrival:  Comments: EMS leg pain reeval

## 2016-10-06 NOTE — Care Management (Signed)
ED CM discussed patient at [facility] with N Nadeau PA-C. Patient lives at home and has not been wearing oxygen due to small living quarters  as per patient. CM recommended consulting with CSW and placing a [facility] referral . ED evaluation still in progress. Patient would benefit from [facility] services, CM discussed with patient she is agreeable [facility]  selected, but would like to speak with a nurse prior to a visit, CM explained that someone will contact her 24 -48 hours. CM is awaiting HH orders from EDP.

## 2016-11-18 ENCOUNTER — Other Ambulatory Visit (HOSPITAL_COMMUNITY): Payer: Self-pay | Admitting: Internal Medicine

## 2016-11-18 DIAGNOSIS — R748 Abnormal levels of other serum enzymes: Secondary | ICD-10-CM

## 2016-11-24 ENCOUNTER — Encounter (HOSPITAL_COMMUNITY): Payer: Medicare Other

## 2016-11-27 ENCOUNTER — Encounter (HOSPITAL_COMMUNITY)
Admission: RE | Admit: 2016-11-27 | Discharge: 2016-11-27 | Disposition: A | Discharge status: Home / Self Care | Payer: Medicare Other | Source: Ambulatory Visit | Attending: Internal Medicine | Admitting: Internal Medicine

## 2016-11-27 DIAGNOSIS — R748 Abnormal levels of other serum enzymes: Secondary | ICD-10-CM | POA: Insufficient documentation

## 2016-11-27 MED ORDER — TECHNETIUM TC 99M MEDRONATE IV KIT
25.0000 | PACK | Freq: Once | INTRAVENOUS | Status: AC | PRN
  Administered 2016-11-27: 25 via INTRAVENOUS

## 2017-03-25 ENCOUNTER — Emergency Department (HOSPITAL_COMMUNITY): Payer: Medicare Other

## 2017-03-25 ENCOUNTER — Observation Stay (HOSPITAL_COMMUNITY)
Admission: EM | Admit: 2017-03-25 | Discharge: 2017-03-27 | Disposition: A | Discharge status: Home / Self Care | Payer: Medicare Other | Attending: Internal Medicine | Admitting: Internal Medicine

## 2017-03-25 ENCOUNTER — Encounter (HOSPITAL_COMMUNITY): Payer: Self-pay | Admitting: Emergency Medicine

## 2017-03-25 ENCOUNTER — Other Ambulatory Visit: Payer: Self-pay

## 2017-03-25 DIAGNOSIS — L089 Local infection of the skin and subcutaneous tissue, unspecified: Secondary | ICD-10-CM | POA: Diagnosis present

## 2017-03-25 DIAGNOSIS — Z9842 Cataract extraction status, left eye: Secondary | ICD-10-CM | POA: Insufficient documentation

## 2017-03-25 DIAGNOSIS — Z882 Allergy status to sulfonamides status: Secondary | ICD-10-CM | POA: Diagnosis not present

## 2017-03-25 DIAGNOSIS — L03116 Cellulitis of left lower limb: Secondary | ICD-10-CM

## 2017-03-25 DIAGNOSIS — Z8249 Family history of ischemic heart disease and other diseases of the circulatory system: Secondary | ICD-10-CM | POA: Diagnosis not present

## 2017-03-25 DIAGNOSIS — E119 Type 2 diabetes mellitus without complications: Secondary | ICD-10-CM | POA: Insufficient documentation

## 2017-03-25 DIAGNOSIS — L0291 Cutaneous abscess, unspecified: Secondary | ICD-10-CM | POA: Diagnosis not present

## 2017-03-25 DIAGNOSIS — Z91040 Latex allergy status: Secondary | ICD-10-CM | POA: Insufficient documentation

## 2017-03-25 DIAGNOSIS — Z79899 Other long term (current) drug therapy: Secondary | ICD-10-CM | POA: Insufficient documentation

## 2017-03-25 DIAGNOSIS — I5032 Chronic diastolic (congestive) heart failure: Secondary | ICD-10-CM | POA: Insufficient documentation

## 2017-03-25 DIAGNOSIS — X58XXXA Exposure to other specified factors, initial encounter: Secondary | ICD-10-CM | POA: Diagnosis not present

## 2017-03-25 DIAGNOSIS — I110 Hypertensive heart disease with heart failure: Secondary | ICD-10-CM | POA: Diagnosis not present

## 2017-03-25 DIAGNOSIS — M4120 Other idiopathic scoliosis, site unspecified: Secondary | ICD-10-CM | POA: Insufficient documentation

## 2017-03-25 DIAGNOSIS — Z888 Allergy status to other drugs, medicaments and biological substances status: Secondary | ICD-10-CM | POA: Diagnosis not present

## 2017-03-25 DIAGNOSIS — M810 Age-related osteoporosis without current pathological fracture: Secondary | ICD-10-CM | POA: Insufficient documentation

## 2017-03-25 DIAGNOSIS — T148XXA Other injury of unspecified body region, initial encounter: Secondary | ICD-10-CM

## 2017-03-25 DIAGNOSIS — I272 Pulmonary hypertension, unspecified: Secondary | ICD-10-CM | POA: Insufficient documentation

## 2017-03-25 DIAGNOSIS — S8012XA Contusion of left lower leg, initial encounter: Secondary | ICD-10-CM | POA: Diagnosis not present

## 2017-03-25 DIAGNOSIS — Z808 Family history of malignant neoplasm of other organs or systems: Secondary | ICD-10-CM | POA: Diagnosis not present

## 2017-03-25 DIAGNOSIS — J449 Chronic obstructive pulmonary disease, unspecified: Secondary | ICD-10-CM | POA: Insufficient documentation

## 2017-03-25 DIAGNOSIS — Z9841 Cataract extraction status, right eye: Secondary | ICD-10-CM | POA: Diagnosis not present

## 2017-03-25 DIAGNOSIS — Z823 Family history of stroke: Secondary | ICD-10-CM | POA: Diagnosis not present

## 2017-03-25 DIAGNOSIS — I10 Essential (primary) hypertension: Secondary | ICD-10-CM | POA: Diagnosis present

## 2017-03-25 DIAGNOSIS — Z880 Allergy status to penicillin: Secondary | ICD-10-CM | POA: Diagnosis not present

## 2017-03-25 DIAGNOSIS — Z833 Family history of diabetes mellitus: Secondary | ICD-10-CM | POA: Diagnosis not present

## 2017-03-25 DIAGNOSIS — J42 Unspecified chronic bronchitis: Secondary | ICD-10-CM | POA: Diagnosis not present

## 2017-03-25 DIAGNOSIS — Z7951 Long term (current) use of inhaled steroids: Secondary | ICD-10-CM | POA: Insufficient documentation

## 2017-03-25 DIAGNOSIS — F329 Major depressive disorder, single episode, unspecified: Secondary | ICD-10-CM | POA: Insufficient documentation

## 2017-03-25 DIAGNOSIS — I509 Heart failure, unspecified: Secondary | ICD-10-CM

## 2017-03-25 LAB — CBC WITH DIFFERENTIAL/PLATELET
BASOS ABS: 0 10*3/uL (ref 0.0–0.1)
Basophils Relative: 0 %
EOS PCT: 0 %
Eosinophils Absolute: 0 10*3/uL (ref 0.0–0.7)
HCT: 49.7 % — ABNORMAL HIGH (ref 36.0–46.0)
HEMOGLOBIN: 16.5 g/dL — AB (ref 12.0–15.0)
LYMPHS PCT: 9 %
Lymphs Abs: 1.1 10*3/uL (ref 0.7–4.0)
MCH: 31.9 pg (ref 26.0–34.0)
MCHC: 33.2 g/dL (ref 30.0–36.0)
MCV: 96.1 fL (ref 78.0–100.0)
Monocytes Absolute: 0.9 10*3/uL (ref 0.1–1.0)
Monocytes Relative: 8 %
NEUTROS PCT: 83 %
Neutro Abs: 10.2 10*3/uL — ABNORMAL HIGH (ref 1.7–7.7)
PLATELETS: 389 10*3/uL (ref 150–400)
RBC: 5.17 MIL/uL — AB (ref 3.87–5.11)
RDW: 16.5 % — ABNORMAL HIGH (ref 11.5–15.5)
WBC: 12.2 10*3/uL — AB (ref 4.0–10.5)

## 2017-03-25 LAB — COMPREHENSIVE METABOLIC PANEL
ALT: 25 U/L (ref 14–54)
ANION GAP: 14 (ref 5–15)
AST: 27 U/L (ref 15–41)
Albumin: 2.6 g/dL — ABNORMAL LOW (ref 3.5–5.0)
Alkaline Phosphatase: 197 U/L — ABNORMAL HIGH (ref 38–126)
BILIRUBIN TOTAL: 1 mg/dL (ref 0.3–1.2)
BUN: 59 mg/dL — AB (ref 6–20)
CO2: 35 mmol/L — ABNORMAL HIGH (ref 22–32)
Calcium: 8.7 mg/dL — ABNORMAL LOW (ref 8.9–10.3)
Chloride: 90 mmol/L — ABNORMAL LOW (ref 101–111)
Creatinine, Ser: 1.14 mg/dL — ABNORMAL HIGH (ref 0.44–1.00)
GFR, EST AFRICAN AMERICAN: 51 mL/min — AB (ref >60)
GFR, EST NON AFRICAN AMERICAN: 44 mL/min — AB (ref >60)
Glucose, Bld: 79 mg/dL (ref 65–99)
POTASSIUM: 3.5 mmol/L (ref 3.5–5.1)
Sodium: 139 mmol/L (ref 135–145)
TOTAL PROTEIN: 6.6 g/dL (ref 6.5–8.1)

## 2017-03-25 LAB — I-STAT CG4 LACTIC ACID, ED: LACTIC ACID, VENOUS: 3.34 mmol/L — AB (ref 0.5–1.9)

## 2017-03-25 MED ORDER — SODIUM CHLORIDE 0.9 % IV BOLUS (SEPSIS)
500.0000 mL | Freq: Once | INTRAVENOUS | Status: AC
  Administered 2017-03-25: 500 mL via INTRAVENOUS

## 2017-03-25 MED ORDER — MOMETASONE FURO-FORMOTEROL FUM 200-5 MCG/ACT IN AERO
2.0000 | INHALATION_SPRAY | Freq: Two times a day (BID) | RESPIRATORY_TRACT | Status: DC
  Administered 2017-03-26: 2 via RESPIRATORY_TRACT
  Filled 2017-03-25: qty 8.8

## 2017-03-25 MED ORDER — ENOXAPARIN SODIUM 30 MG/0.3ML ~~LOC~~ SOLN
30.0000 mg | SUBCUTANEOUS | Status: DC
  Administered 2017-03-25: 30 mg via SUBCUTANEOUS
  Filled 2017-03-25: qty 0.3

## 2017-03-25 MED ORDER — SODIUM CHLORIDE 0.9 % IV SOLN
Freq: Once | INTRAVENOUS | Status: AC
  Administered 2017-03-25: 17:00:00 via INTRAVENOUS

## 2017-03-25 MED ORDER — SILDENAFIL CITRATE 20 MG PO TABS
20.0000 mg | ORAL_TABLET | Freq: Three times a day (TID) | ORAL | Status: DC
  Filled 2017-03-25 (×6): qty 1

## 2017-03-25 MED ORDER — ALLOPURINOL 100 MG PO TABS
200.0000 mg | ORAL_TABLET | Freq: Every day | ORAL | Status: DC
  Administered 2017-03-26: 200 mg via ORAL
  Filled 2017-03-25 (×2): qty 2

## 2017-03-25 MED ORDER — LEVOFLOXACIN IN D5W 500 MG/100ML IV SOLN
500.0000 mg | Freq: Once | INTRAVENOUS | Status: AC
  Administered 2017-03-25: 500 mg via INTRAVENOUS
  Filled 2017-03-25: qty 100

## 2017-03-25 MED ORDER — METRONIDAZOLE IN NACL 5-0.79 MG/ML-% IV SOLN
500.0000 mg | Freq: Once | INTRAVENOUS | Status: AC
  Administered 2017-03-25: 500 mg via INTRAVENOUS
  Filled 2017-03-25: qty 100

## 2017-03-25 MED ORDER — SODIUM CHLORIDE 0.9 % IV BOLUS (SEPSIS)
1000.0000 mL | Freq: Once | INTRAVENOUS | Status: AC
  Administered 2017-03-25: 1000 mL via INTRAVENOUS

## 2017-03-25 MED ORDER — AZITHROMYCIN 500 MG IV SOLR
500.0000 mg | INTRAVENOUS | Status: DC
  Filled 2017-03-25: qty 500

## 2017-03-25 MED ORDER — VANCOMYCIN HCL IN DEXTROSE 750-5 MG/150ML-% IV SOLN
750.0000 mg | INTRAVENOUS | Status: DC
  Filled 2017-03-25: qty 150

## 2017-03-25 MED ORDER — VANCOMYCIN HCL IN DEXTROSE 1-5 GM/200ML-% IV SOLN
1000.0000 mg | Freq: Once | INTRAVENOUS | Status: AC
  Administered 2017-03-25: 1000 mg via INTRAVENOUS
  Filled 2017-03-25: qty 200

## 2017-03-25 NOTE — ED Notes (Signed)
Pt refused to continue wearing blood pressure cuff. Pt states it is making her arm itch.

## 2017-03-25 NOTE — ED Notes (Signed)
Report called to [employer] on 5E

## 2017-03-25 NOTE — ED Notes (Signed)
This Probation officer stuck pt x2 but was unsuccessful Rn. Luz Lex made aware.

## 2017-03-25 NOTE — Progress Notes (Signed)
Pharmacy Antibiotic Note  Angel Estrada is a 83 y.o. female admitted on 03/25/2017 with cellulitis.  Pharmacy has been consulted for vancomycin dosing.  Azithromycin also started to cover for cat scratch disease.  Vancomycin 1g x 1 given in ED.  Plan: Vancomycin 750 mg IV q48h. Next dose 11/23. Daily SCr.     Temp (24hrs), Avg:98 F (36.7 C), Min:98 F (36.7 C), Max:98 F (36.7 C)  Recent Labs  Lab 03/25/17 1634 03/25/17 1641  WBC 12.2*  --   CREATININE 1.14*  --   LATICACIDVEN  --  3.34*    CrCl cannot be calculated (Unknown ideal weight.).    Allergies  Allergen Reactions  . Penicillins Swelling and Rash    Has patient had a PCN reaction causing immediate rash, facial/tongue/throat swelling, SOB or lightheadedness with hypotension: Yes Has patient had a PCN reaction causing severe rash involving mucus membranes or skin necrosis: No Has patient had a PCN reaction that required hospitalization: No Has patient had a PCN reaction occurring within the last 10 years: No If all of the above answers are "NO", then may proceed with Cephalosporin use.   Marland Kitchen Lisinopril-Hydrochlorothiazide Other (See Comments)    Pt's PCP took her off medication because of possible kidney damage  . Sulfa Antibiotics Other (See Comments)    Unknown-- childhood allergy   . Latex Rash    Antimicrobials this admission: 11/21 Levaquin x 1 11/21 Flagyl x 1 11/21 Azithromycin >> 11/21 Vancomycin  Dose adjustments this admission:  Microbiology results: 11/21 BCx:   Thank you for allowing pharmacy to be a part of this patient's care.  Hershal Coria 03/25/2017 8:02 PM

## 2017-03-25 NOTE — ED Notes (Signed)
Bed: [facility] Expected date:  Expected time:  Means of arrival:  Comments: EMS/leg wound

## 2017-03-25 NOTE — ED Triage Notes (Signed)
Pt from her PCP with a wound from a cat scratch on her left lower shin. Pt states there was a small wound to her shin prior to cat scratch. Pt has had increasing difficulty with ambulation not related to this. Pt would like to be evaluated for difficulty ambulating as well.

## 2017-03-25 NOTE — ED Provider Notes (Signed)
[facility] DEPT Provider Note   CSN: [id number] Arrival date & time: 03/25/17  1311     History   Chief Complaint Chief Complaint  Patient presents with  . Wound Infection    HPI Angel Estrada is a 83 y.o. female.  HPI Patient reports she has had problems with a chronic wound on her anterior left tibia.  She reports in the past she has had some infection but it is gotten better.  She bumped it several days ago and she knew that she opened it back up.  She has been covering it up with Band-Aids.  Patient also has cats and she equivocated on whether or not she had gotten a scratch in the wound or the main problem was having struck it a number of days ago.  She reports it has become very painful and it is nearly impossible for her to weight-bear on it right now.  She reports that her doctor was concerned about it and want her to go to the emergency department for antibiotics and tests.  She denies fever chills nausea or vomiting.  No general malaise and no documented fever. Past Medical History:  Diagnosis Date  . CHF (congestive heart failure) ([place])   . COPD (chronic obstructive pulmonary disease) ([place])   . Depression   . Diabetes mellitus without complication ([place])   . Hypertension   . Osteoporosis     Patient Active Problem List   Diagnosis Date Noted  . Exertional dyspnea 02/13/2016  . Idiopathic scoliosis 02/13/2016  . CHF exacerbation ([place]) 07/31/2015  . Hypertension 07/30/2015  . COPD (chronic obstructive pulmonary disease) ([place]) 07/30/2015  . CHF (congestive heart failure) ([place]) 07/30/2015  . Diabetes mellitus ([place]) 07/30/2015  . Acute on chronic diastolic CHF (congestive heart failure), NYHA class 1 ([place]) 07/30/2015    Past Surgical History:  Procedure Laterality Date  . CATARACT EXTRACTION Bilateral   . US ECHOCARDIOGRAPHY      OB History    No data available       Home Medications    Prior to Admission medications     Medication Sig Start Date End Date Taking? Authorizing Provider  alendronate (FOSAMAX) 35 MG tablet Take 35 mg by mouth. Once weekly 12/30/16  Yes [provider]  allopurinol (ZYLOPRIM) 100 MG tablet Take 200 mg by mouth daily. 01/23/17  Yes [provider]  colchicine 0.6 MG tablet Take 0.6 mg by mouth daily. 12/22/16  Yes [provider]  furosemide (LASIX) 40 MG tablet Take 60 mg by mouth daily. 07/09/15  Yes [provider]  ibuprofen (ADVIL,MOTRIN) 200 MG tablet Take 200 mg by mouth daily as needed for moderate pain.   Yes [provider]  Liniments (SALONPAS PAIN RELIEF PATCH EX) Apply 1 patch topically daily as needed (PAIN).   Yes [provider]  nystatin-triamcinolone (MYCOLOG II) cream Apply 1 application topically 2 (two) times daily as needed (rash). Reported on 07/30/2015 07/02/15  Yes [provider]  sildenafil (REVATIO) 20 MG tablet Take 1 tablet (20 mg total) by mouth 3 (three) times daily. 07/28/16  Yes Josue Hector, MD  triamcinolone cream (KENALOG) 0.1 % Apply 1 application topically 2 (two) times daily as needed (skin irritation).   Yes [provider]  budesonide-formoterol (SYMBICORT) 160-4.5 MCG/ACT inhaler Inhale 2 puffs into the lungs 2 (two) times daily.    [provider]    Family History Family History  Problem Relation Age of Onset  .  Diabetes type II Mother   . Hypertension Mother   . Brain cancer Father   . Hypertension Father   . Diabetes type II Brother   . Stroke Other     Social History Social History   Tobacco Use  . Smoking status: Never Smoker  . Smokeless tobacco: Never Used  Substance Use Topics  . Alcohol use: No  . Drug use: No     Allergies   Penicillins; Lisinopril-hydrochlorothiazide; Sulfa antibiotics; and Latex   Review of Systems Review of Systems 10 Systems reviewed and are negative for acute change except as noted in the HPI.   Physical  Exam Updated Vital Signs BP 107/84 (BP Location: Right Arm)   Pulse 96   Temp 98 F (36.7 C) (Oral)   Resp 18   SpO2 100%   Physical Exam  Constitutional: She is oriented to person, place, and time. She appears well-developed and well-nourished. No distress.  Patient is alert and nontoxic.  No acute distress.  Grooming is poor.  HENT:  Head: Normocephalic and atraumatic.  Very poor dentition  Eyes: EOM are normal.  Lower lid ectropion bilaterally with significant erythema.  Cardiovascular: Normal rate, regular rhythm, normal heart sounds and intact distal pulses.  Pulmonary/Chest: Effort normal and breath sounds normal.  Abdominal: Soft. She exhibits no distension. There is no tenderness. There is no guarding.  Musculoskeletal:  Patient has a foul-smelling wound to the left pretibial surface.  Once Band-Aids removed it appears there is a nonhealing laceration with purulent material and cat hair imbedded in it.  Large swelling of the anterior tibia.  Foot is without erythema or swelling.  Neurological: She is alert and oriented to person, place, and time. No cranial nerve deficit. She exhibits normal muscle tone. Coordination normal.  Skin: Skin is warm and dry.  Psychiatric: She has a normal mood and affect.         ED Treatments / Results  Labs (all labs ordered are listed, but only abnormal results are displayed) Labs Reviewed  CULTURE, BLOOD (ROUTINE X 2)  CULTURE, BLOOD (ROUTINE X 2)  COMPREHENSIVE METABOLIC PANEL  CBC WITH DIFFERENTIAL/PLATELET  URINALYSIS, ROUTINE W REFLEX MICROSCOPIC  I-STAT CG4 LACTIC ACID, ED    EKG  EKG Interpretation None       Radiology No results found.  Procedures Procedures (including critical care time)  Medications Ordered in ED Medications  levofloxacin (LEVAQUIN) IVPB 500 mg (not administered)  metroNIDAZOLE (FLAGYL) IVPB 500 mg (not administered)  vancomycin (VANCOCIN) IVPB 1000 mg/200 mL premix (not administered)      Initial Impression / Assessment and Plan / ED Course  I have reviewed the triage vital signs and the nursing notes.  Pertinent labs & imaging results that were available during my care of the patient were reviewed by me and considered in my medical decision making (see chart for details).    Labs and x-rays are pending at this time.  Plan will be for review by Dr. Billy Fischer and call for admission to hospitalist service.  At this time, patient does not meet sepsis criteria.  She is not febrile nor tachycardic.  Clinically the patient is not septic and has history of congestive heart failure.  Would opt to use caution with fluid bolusing pending stronger evidence of sepsis.  Final Clinical Impressions(s) / ED Diagnoses   Final diagnoses:  Cellulitis of left lower extremity  Wound infection    ED Discharge Orders    None  Charlesetta Shanks, MD 03/25/17 [id number]

## 2017-03-25 NOTE — H&P (Signed)
History and Physical    Angel Estrada WNI:[id number] DOB: 1933-07-28 DOA: 03/25/2017  I have briefly reviewed the patient's prior medical records in [place]  PCP: Jani Gravel, MD  Patient coming from: home  Chief Complaint: Left lower extremity wound  HPI: Angel Estrada is a 83 y.o. female with medical history significant of pulmonary hypertension, COPD, diet-controlled diabetes mellitus, hypertension, who presents to the emergency room with a chief complaint of left lower extremity wound that is been slowly getting worse over the last few days.  She states that she has had a wound in that area in the past and that healed well, however she thinks she bumped against a piece of furniture in her home and the wound has reopened.  She also feels like her cat has been getting too close to the wound and per nursing notes in the ER there were clumps of cat hair that were embedded in the wound.  She denies any fever or chills, she has no chest pain or shortness of breath.  She has no abdominal pain, nausea or vomiting.  ED Course: In the emergency room she is afebrile, heart rate ranging from 50s-100, blood pressure was initially stable however has been on the soft side into the mid 90s later on, on blood work her creatinine is 1.1, lactic acid was initially elevated at 3.3 and she had a leukocytosis of 12.2.  She was given vancomycin, Levaquin and metronidazole as well and we were asked to admit  Review of Systems: As per HPI otherwise 10 point review of systems negative.   Past Medical History:  Diagnosis Date  . CHF (congestive heart failure) ([place])   . COPD (chronic obstructive pulmonary disease) ([place])   . Depression   . Diabetes mellitus without complication ([place])   . Hypertension   . Osteoporosis     Past Surgical History:  Procedure Laterality Date  . CATARACT EXTRACTION Bilateral   . US ECHOCARDIOGRAPHY       reports that  has never smoked. she has never used smokeless  tobacco. She reports that she does not drink alcohol or use drugs.  Allergies  Allergen Reactions  . Penicillins Swelling and Rash    Has patient had a PCN reaction causing immediate rash, facial/tongue/throat swelling, SOB or lightheadedness with hypotension: Yes Has patient had a PCN reaction causing severe rash involving mucus membranes or skin necrosis: No Has patient had a PCN reaction that required hospitalization: No Has patient had a PCN reaction occurring within the last 10 years: No If all of the above answers are "NO", then may proceed with Cephalosporin use.   Marland Kitchen Lisinopril-Hydrochlorothiazide Other (See Comments)    Pt's PCP took her off medication because of possible kidney damage  . Sulfa Antibiotics Other (See Comments)    Unknown-- childhood allergy   . Latex Rash    Family History  Problem Relation Age of Onset  . Diabetes type II Mother   . Hypertension Mother   . Brain cancer Father   . Hypertension Father   . Diabetes type II Brother   . Stroke Other     Prior to Admission medications   Medication Sig Start Date End Date Taking? Authorizing Provider  alendronate (FOSAMAX) 35 MG tablet Take 35 mg by mouth. Once weekly 12/30/16  Yes [provider]  allopurinol (ZYLOPRIM) 100 MG tablet Take 200 mg by mouth daily. 01/23/17  Yes [provider]  colchicine 0.6 MG tablet Take 0.6 mg by mouth  daily. 12/22/16  Yes [provider]  furosemide (LASIX) 40 MG tablet Take 60 mg by mouth daily. 07/09/15  Yes [provider]  ibuprofen (ADVIL,MOTRIN) 200 MG tablet Take 200 mg by mouth daily as needed for moderate pain.   Yes [provider]  Liniments (SALONPAS PAIN RELIEF PATCH EX) Apply 1 patch topically daily as needed (PAIN).   Yes [provider]  nystatin-triamcinolone (MYCOLOG II) cream Apply 1 application topically 2 (two) times daily as needed (rash). Reported on 07/30/2015 07/02/15  Yes [provider]    sildenafil (REVATIO) 20 MG tablet Take 1 tablet (20 mg total) by mouth 3 (three) times daily. 07/28/16  Yes Josue Hector, MD  triamcinolone cream (KENALOG) 0.1 % Apply 1 application topically 2 (two) times daily as needed (skin irritation).   Yes [provider]  budesonide-formoterol (SYMBICORT) 160-4.5 MCG/ACT inhaler Inhale 2 puffs into the lungs 2 (two) times daily.    [provider]    Physical Exam: Vitals:   03/25/17 1327 03/25/17 1718 03/25/17 1730 03/25/17 1740  BP: 1_0   Pulse: 96  91 98  Resp: 18     Temp: 98 F (36.7 C)     TempSrc: Oral     SpO2: 100% (!) 83% 97% 95%      Constitutional: NAD, calm, comfortable Eyes: PERRL, lids and conjunctivae normal ENMT: Mucous membranes are moist. Neck: normal, supple Respiratory: clear to auscultation bilaterally, no wheezing, no crackles. Normal respiratory effort. No accessory muscle use.  Cardiovascular: Irregular, no murmurs.  No edema. Abdomen: no tenderness, no masses palpated. Bowel sounds positive.  Musculoskeletal: no clubbing / cyanosis. Normal muscle tone.  Skin: Left pretibial open wound with what looks like a about 2 centimeter subcutaneous hematoma, with surrounding cellulitis Neurologic: CN 2-12 grossly intact. Strength 5/5 in all 4.  Psychiatric: Normal judgment and insight. Alert and oriented x 3. Normal mood.   Labs on Admission: I have personally reviewed following labs and imaging studies  CBC: Recent Labs  Lab 03/25/17 1634  WBC 12.2*  NEUTROABS 10.2*  HGB 16.5*  HCT 49.7*  MCV 96.1  PLT 884   Basic Metabolic Panel: Recent Labs  Lab 03/25/17 1634  NA 139  K 3.5  CL 90*  CO2 35*  GLUCOSE 79  BUN 59*  CREATININE 1.14*  CALCIUM 8.7*   GFR: CrCl cannot be calculated (Unknown ideal weight.). Liver Function Tests: Recent Labs  Lab 03/25/17 1634  AST 27  ALT 25  ALKPHOS 197*  BILITOT 1.0  PROT 6.6  ALBUMIN 2.6*   No results for input(s): LIPASE,  AMYLASE in the last 168 hours. No results for input(s): AMMONIA in the last 168 hours. Coagulation Profile: No results for input(s): INR, PROTIME in the last 168 hours. Cardiac Enzymes: No results for input(s): CKTOTAL, CKMB, CKMBINDEX, TROPONINI in the last 168 hours. BNP (last 3 results) No results for input(s): PROBNP in the last 8760 hours. HbA1C: No results for input(s): HGBA1C in the last 72 hours. CBG: No results for input(s): GLUCAP in the last 168 hours. Lipid Profile: No results for input(s): CHOL, HDL, LDLCALC, TRIG, CHOLHDL, LDLDIRECT in the last 72 hours. Thyroid Function Tests: No results for input(s): TSH, T4TOTAL, FREET4, T3FREE, THYROIDAB in the last 72 hours. Anemia Panel: No results for input(s): VITAMINB12, FOLATE, FERRITIN, TIBC, IRON, RETICCTPCT in the last 72 hours. Urine analysis:    Component Value Date/Time   COLORURINE YELLOW 07/31/2015 [place] 07/31/2015  [place] 1.009 07/31/2015 0220   PHURINE 7.0 07/31/2015 0220   GLUCOSEU NEGATIVE 07/31/2015 0220   HGBUR NEGATIVE 07/31/2015 0220   BILIRUBINUR NEGATIVE 07/31/2015 0220   KETONESUR NEGATIVE 07/31/2015 0220   PROTEINUR NEGATIVE 07/31/2015 0220   NITRITE NEGATIVE 07/31/2015 0220   LEUKOCYTESUR MODERATE (A) 07/31/2015 0220     Radiological Exams on Admission: Dg Tibia/fibula Left  Result Date: 03/25/2017 CLINICAL DATA:  Nonhealing wound in the anterior left lower extremity. EXAM: LEFT TIBIA AND FIBULA - 2 VIEW COMPARISON:  None. FINDINGS: Soft swelling is present anterior to the tibia. There is no underlying osseous abnormality. No radiopaque foreign body is present. The knee and ankle joints are unremarkable. IMPRESSION: 1. Soft tissue swelling anterior to the tibia without underlying fracture or osseous abnormality. Electronically Signed   By: San Morelle M.D.   On: 03/25/2017 17:23    EKG: Independently reviewed. Pending   Assessment/Plan Active Problems:    Hypertension   COPD (chronic obstructive pulmonary disease) ([facility])   CHF (congestive heart failure) ([facility])   Idiopathic scoliosis   Abscess   Pulmonary hypertension ([facility])    Left lower extremity cellulitis -Patient started on vancomycin, Levaquin and metronidazole, will continue vancomycin for now, add azithromycin to cover for cat scratch disease -Wound consult in the morning  Pulmonary hypertension -Continue home medication with sildenafil, respiratory status stable  COPD -No wheezing, continue home medications  Chronic diastolic CHF -She appears euvolemic  Irregular heart rate -EKG pending, her heart sounded somewhat irregular exam, I wonder whether she has undiagnosed A. fib   DVT prophylaxis: Lovenox Code Status: Full code (she is thinking about DNR but wants to think about it overnight) Family Communication: No family at bedside Disposition Plan: Admit to telemetry Consults called: None    Admission status: Observation  At the point of initial evaluation, it is my clinical opinion that admission for OBSERVATION is reasonable and necessary because the patient's presenting complaints in the context of their chronic conditions represent sufficient risk of deterioration or significant morbidity to constitute reasonable grounds for close observation in the hospital setting, but that the patient may be medically stable for discharge from the hospital within 24 to 48 hours.    Marzetta Board, MD [facility] Pager [phone]  If 7PM-7AM, please contact night-coverage [email or web address] Password [email or web address]  03/25/2017, 6:30 PM

## 2017-03-25 NOTE — ED Notes (Signed)
Pt's wound was flushed and re-wrapped. Clumps of cat hair were removed

## 2017-03-25 NOTE — ED Notes (Signed)
Pt to xray

## 2017-03-25 NOTE — ED Notes (Signed)
Pt placed on 2L [place] for oxygen ranging between 85-92%. Pt states when she is hospitalized she normally gets put on [place] oxygen, but has never been told to wear oxygen at home

## 2017-03-25 NOTE — ED Notes (Signed)
Assigned 18:37, 1503, call report at 18:57, floor said to call report at 19:15

## 2017-03-26 DIAGNOSIS — L03116 Cellulitis of left lower limb: Secondary | ICD-10-CM

## 2017-03-26 DIAGNOSIS — J42 Unspecified chronic bronchitis: Secondary | ICD-10-CM | POA: Diagnosis not present

## 2017-03-26 LAB — COMPREHENSIVE METABOLIC PANEL
ALK PHOS: 162 U/L — AB (ref 38–126)
ALT: 21 U/L (ref 14–54)
ANION GAP: 10 (ref 5–15)
AST: 23 U/L (ref 15–41)
Albumin: 2.3 g/dL — ABNORMAL LOW (ref 3.5–5.0)
BUN: 53 mg/dL — AB (ref 6–20)
CHLORIDE: 98 mmol/L — AB (ref 101–111)
CO2: 34 mmol/L — ABNORMAL HIGH (ref 22–32)
Calcium: 8.4 mg/dL — ABNORMAL LOW (ref 8.9–10.3)
Creatinine, Ser: 1.08 mg/dL — ABNORMAL HIGH (ref 0.44–1.00)
GFR calc Af Amer: 54 mL/min — ABNORMAL LOW (ref >60)
GFR, EST NON AFRICAN AMERICAN: 47 mL/min — AB (ref >60)
Glucose, Bld: 75 mg/dL (ref 65–99)
Potassium: 3.8 mmol/L (ref 3.5–5.1)
Sodium: 142 mmol/L (ref 135–145)
TOTAL PROTEIN: 5.7 g/dL — AB (ref 6.5–8.1)
Total Bilirubin: 1 mg/dL (ref 0.3–1.2)

## 2017-03-26 LAB — CBC
HEMATOCRIT: 46.2 % — AB (ref 36.0–46.0)
Hemoglobin: 14.6 g/dL (ref 12.0–15.0)
MCH: 30.9 pg (ref 26.0–34.0)
MCHC: 31.6 g/dL (ref 30.0–36.0)
MCV: 97.9 fL (ref 78.0–100.0)
Platelets: 354 10*3/uL (ref 150–400)
RBC: 4.72 MIL/uL (ref 3.87–5.11)
RDW: 16.7 % — AB (ref 11.5–15.5)
WBC: 11.2 10*3/uL — ABNORMAL HIGH (ref 4.0–10.5)

## 2017-03-26 MED ORDER — ENOXAPARIN SODIUM 30 MG/0.3ML ~~LOC~~ SOLN
20.0000 mg | SUBCUTANEOUS | Status: DC
  Administered 2017-03-26: 20 mg via SUBCUTANEOUS
  Filled 2017-03-26 (×2): qty 0.2

## 2017-03-26 NOTE — Evaluation (Signed)
Physical Therapy Evaluation Patient Details Name: Angel Estrada MRN: [id number] DOB: 12-24-1933 Today's Date: 03/26/2017   History of Present Illness  Angel Estrada is a 83 y.o. female with medical history significant of pulmonary hypertension, COPD, diet-controlled diabetes mellitus, hypertension, who presented to the emergency room with a chief complaint of left lower extremity wound/hematoma with surrounding redness  Clinical Impression  Pt admitted with above diagnosis. Pt currently with functional limitations due to the deficits listed below (see PT Problem List).  Pt will benefit from skilled PT to increase their independence and safety with mobility to allow discharge to the venue listed below.   Pt limited d/t decr sats with activity; pt with generally disheveled appearance and rec HHPT at this time but  may need SNF d/t decr caregiver support depending on progress; will follow in acute setting     Follow Up Recommendations Home health PT(vs SNF depending on progress)    Equipment Recommendations  Rolling walker with 5" wheels    Recommendations for Other Services       Precautions / Restrictions Precautions Precautions: Fall Precaution Comments: LLE hematoma Restrictions Weight Bearing Restrictions: No      Mobility  Bed Mobility Overal bed mobility: Needs Assistance Bed Mobility: Supine to Sit     Supine to sit: Min assist     General bed mobility comments: assist with LLE  Transfers Overall transfer level: Needs assistance Equipment used: Rolling walker (2 wheeled) Transfers: Sit to/from [facility] Sit to Stand: Min assist;Min guard Stand pivot transfers: Min assist;Min guard       General transfer comment: assist to balance during transition to RW and during stand pivot, cues for hand placement  Ambulation/Gait             General Gait Details: pivotal steps to chair only d/t decr sats (86% after pt removed O2, replaced at 2L,  1-2 minutes for O2 sats to reach >90%, left O2 in place at 2L)  Science writer    Modified Rankin (Stroke Patients Only)       Balance Overall balance assessment: Needs assistance;History of Falls Sitting-balance support: No upper extremity supported;Feet supported Sitting balance-Leahy Scale: Good       Standing balance-Leahy Scale: Poor Standing balance comment: reliant on UEs                              Pertinent Vitals/Pain Pain Assessment: No/denies pain    Home Living Family/patient expects to be discharged to:: Private residence Living Arrangements: ("housemate")   Type of Home: House         Home Equipment: Kasandra Knudsen - single point Additional Comments: doesn't know where  her cane is but states she walks with it sometimes    Prior Function                 Hand Dominance        Extremity/Trunk Assessment   Upper Extremity Assessment Upper Extremity Assessment: Generalized weakness    Lower Extremity Assessment Lower Extremity Assessment: Generalized weakness LLE Deficits / Details: diffuse atrophy noted bil LEs       Communication      Cognition Arousal/Alertness: Awake/alert Behavior During Therapy: WFL for tasks assessed/performed Overall Cognitive Status: Within Functional Limits for tasks assessed  General Comments: generally disheveled appearance      General Comments      Exercises     Assessment/Plan    PT Assessment Patient needs continued PT services  PT Problem List Decreased strength;Decreased activity tolerance;Decreased balance;Decreased knowledge of use of DME;Decreased mobility       PT Treatment Interventions DME instruction;Gait training;Functional mobility training;Therapeutic exercise;Balance training;Patient/family education;Therapeutic activities    PT Goals (Current goals can be found in the Care Plan section)  Acute  Rehab PT Goals Patient Stated Goal: to get better PT Goal Formulation: With patient Potential to Achieve Goals: Good    Frequency Min 3X/week   Barriers to discharge Decreased caregiver support live with "housemate", pt says "she is in bad shape too"    Co-evaluation               AM-PAC PT "6 Clicks" Daily Activity  Outcome Measure Difficulty turning over in bed (including adjusting bedclothes, sheets and blankets)?: A Little Difficulty moving from lying on back to sitting on the side of the bed? : Unable Difficulty sitting down on and standing up from a chair with arms (e.g., wheelchair, bedside commode, etc,.)?: Unable Help needed moving to and from a bed to chair (including a wheelchair)?: A Little Help needed walking in hospital room?: A Little Help needed climbing 3-5 steps with a railing? : A Lot 6 Click Score: 13    End of Session Equipment Utilized During Treatment: Gait belt Activity Tolerance: Patient tolerated treatment well;Treatment limited secondary to medical complications (Comment)(decr sats with activity, does not use O2 at baseline) Patient left: in chair;with chair alarm set;with call bell/phone within reach Nurse Communication: Mobility status PT Visit Diagnosis: Unsteadiness on feet (R26.81);History of falling (Z91.81)    Time: 6010-9323 PT Time Calculation (min) (ACUTE ONLY): 10 min   Charges:   PT Evaluation $PT Eval Low Complexity: 1 Low     PT G Codes:   PT G-Codes **NOT FOR INPATIENT CLASS** Functional Assessment Tool Used: AM-PAC 6 Clicks Basic Mobility;Clinical judgement Functional Limitation: Mobility: Walking and moving around Mobility: Walking and Moving Around Current Status (F5732): At least 20 percent but less than 40 percent impaired, limited or restricted Mobility: Walking and Moving Around Goal Status [phone]): At least 1 percent but less than 20 percent impaired, limited or restricted    Kenyon Ana, PT Pager:  [phone] 03/26/2017   [facility] 03/26/2017, 6:02 PM

## 2017-03-26 NOTE — Progress Notes (Signed)
PROGRESS NOTE    Angel Estrada  ONG:[id number] DOB: 11/21/1933 DOA: 03/25/2017 PCP: Jani Gravel, MD  Brief Narrative:Angel Estrada is a 83 y.o. female with medical history significant of pulmonary hypertension, COPD, diet-controlled diabetes mellitus, hypertension, who presented to the emergency room with a chief complaint of left lower extremity wound that is been slowly getting worse over the last few days.  She states that she has had a wound in that area in the past and that healed well, however she thinks she bumped against a piece of furniture in her home and the wound has reopened  Assessment & Plan:   1. Left Leg hematoma with possible surrounding infection -Following trauma last week -Clinically infection not very impressive, continue IV vancomycin today, transition to a short course of oral antibiotics tomorrow, elevate limb -Expect hematomas take a few weeks to resolve -Ambulate, physical therapy  2. Pulmonary hypertension -Stable continue sildenafil  3. COPD Stable continue home regimen  4. Chronic diastolic CHF -Euvolemic will stop fluids  DVT prophylaxis:Lovenox  Code Status: Full code  Family Communication:No family at bedside  Disposition Plan: Home tomorrow,     Procedures:   Antimicrobials:    Subjective: -Feels okay, does not want to go home on Thanksgiving day  Objective: Vitals:   03/25/17 1955 03/25/17 2025 03/26/17 0530 03/26/17 1012  BP: (!) 108/92  108/79   Pulse: 86  100   Resp: 16  20   Temp: 97.6 F (36.4 C)  97.9 F (36.6 C)   TempSrc: Oral  Oral   SpO2: 100%  94% (!) 85%  Weight:  41.6 kg (91 lb 11.4 oz) 43.5 kg (95 lb 14.4 oz)   Height:  5' (1.524 m)      Intake/Output Summary (Last 24 hours) at 03/26/2017 1145 Last data filed at 03/26/2017 1000 Gross per 24 hour  Intake 480 ml  Output -  Net 480 ml   Filed Weights   03/25/17 2025 03/26/17 0530  Weight: 41.6 kg (91 lb 11.4 oz) 43.5 kg (95 lb 14.4 oz)     Examination:  General exam: Appears calm and comfortable, Frail thinly built female  Respiratory system: Clear to auscultation. Respiratory effort normal. Cardiovascular system: S1 & S2 heard,regular   Gastrointestinal system: Abdomen is nondistended, soft and nontender.Normal bowel sounds heard. Central nervous system: Alert and oriented. No focal neurological deficits. Extremities: Left lower leg with 3 x 3 cm hematoma with old dark blood with mild surrounding erythema  Skin: No rashes, lesions or ulcers Psychiatry: Judgement and insight appear normal. Mood & affect appropriate.     Data Reviewed:   CBC: Recent Labs  Lab 03/25/17 1634 03/26/17 0634  WBC 12.2* 11.2*  NEUTROABS 10.2*  --   HGB 16.5* 14.6  HCT 49.7* 46.2*  MCV 96.1 97.9  PLT 389 440   Basic Metabolic Panel: Recent Labs  Lab 03/25/17 1634 03/26/17 0634  NA 139 142  K 3.5 3.8  CL 90* 98*  CO2 35* 34*  GLUCOSE 79 75  BUN 59* 53*  CREATININE 1.14* 1.08*  CALCIUM 8.7* 8.4*   GFR: Estimated Creatinine Clearance: 28.1 mL/min (A) (by C-G formula based on SCr of 1.08 mg/dL (H)). Liver Function Tests: Recent Labs  Lab 03/25/17 1634 03/26/17 0634  AST 27 23  ALT 25 21  ALKPHOS 197* 162*  BILITOT 1.0 1.0  PROT 6.6 5.7*  ALBUMIN 2.6* 2.3*   No results for input(s): LIPASE, AMYLASE in the last 168 hours. No results for input(s):  AMMONIA in the last 168 hours. Coagulation Profile: No results for input(s): INR, PROTIME in the last 168 hours. Cardiac Enzymes: No results for input(s): CKTOTAL, CKMB, CKMBINDEX, TROPONINI in the last 168 hours. BNP (last 3 results) No results for input(s): PROBNP in the last 8760 hours. HbA1C: No results for input(s): HGBA1C in the last 72 hours. CBG: No results for input(s): GLUCAP in the last 168 hours. Lipid Profile: No results for input(s): CHOL, HDL, LDLCALC, TRIG, CHOLHDL, LDLDIRECT in the last 72 hours. Thyroid Function Tests: No results for input(s):  TSH, T4TOTAL, FREET4, T3FREE, THYROIDAB in the last 72 hours. Anemia Panel: No results for input(s): VITAMINB12, FOLATE, FERRITIN, TIBC, IRON, RETICCTPCT in the last 72 hours. Urine analysis:    Component Value Date/Time   COLORURINE YELLOW 07/31/2015 0220   APPEARANCEUR CLEAR 07/31/2015 0220   LABSPEC 1.009 07/31/2015 0220   PHURINE 7.0 07/31/2015 0220   GLUCOSEU NEGATIVE 07/31/2015 0220   HGBUR NEGATIVE 07/31/2015 0220   BILIRUBINUR NEGATIVE 07/31/2015 0220   KETONESUR NEGATIVE 07/31/2015 0220   PROTEINUR NEGATIVE 07/31/2015 0220   NITRITE NEGATIVE 07/31/2015 0220   LEUKOCYTESUR MODERATE (A) 07/31/2015 0220   Sepsis Labs: _0 (procalcitonin:4,lacticidven:4)  )No results found for this or any previous visit (from the past 240 hour(s)).       Radiology Studies: Dg Tibia/fibula Left  Result Date: 03/25/2017 CLINICAL DATA:  Nonhealing wound in the anterior left lower extremity. EXAM: LEFT TIBIA AND FIBULA - 2 VIEW COMPARISON:  None. FINDINGS: Soft swelling is present anterior to the tibia. There is no underlying osseous abnormality. No radiopaque foreign body is present. The knee and ankle joints are unremarkable. IMPRESSION: 1. Soft tissue swelling anterior to the tibia without underlying fracture or osseous abnormality. Electronically Signed   By: San Morelle M.D.   On: 03/25/2017 17:23        Scheduled Meds: . allopurinol  200 mg Oral Daily  . enoxaparin (LOVENOX) injection  20 mg Subcutaneous Q24H  . mometasone-formoterol  2 puff Inhalation BID  . sildenafil  20 mg Oral TID   Continuous Infusions: . azithromycin    . [START ON 03/27/2017] vancomycin       LOS: 0 days    Time spent: 33mn    PDomenic Polite MD [facility] Page via [email or web address], password [email or web address] After 7PM please contact night-coverage  03/26/2017, 11:45 AM

## 2017-03-26 NOTE — Progress Notes (Signed)
RN received phone call from center telemetry r/t patient's heart rate = 130; afib rhythm; then back to SR and HR=95. Notified MD at the same time.

## 2017-03-27 DIAGNOSIS — L03116 Cellulitis of left lower limb: Secondary | ICD-10-CM | POA: Diagnosis not present

## 2017-03-27 DIAGNOSIS — J42 Unspecified chronic bronchitis: Secondary | ICD-10-CM | POA: Diagnosis not present

## 2017-03-27 LAB — BASIC METABOLIC PANEL
Anion gap: 8 (ref 5–15)
BUN: 46 mg/dL — AB (ref 6–20)
CHLORIDE: 98 mmol/L — AB (ref 101–111)
CO2: 37 mmol/L — AB (ref 22–32)
Calcium: 8.5 mg/dL — ABNORMAL LOW (ref 8.9–10.3)
Creatinine, Ser: 1.03 mg/dL — ABNORMAL HIGH (ref 0.44–1.00)
GFR calc Af Amer: 57 mL/min — ABNORMAL LOW (ref >60)
GFR calc non Af Amer: 50 mL/min — ABNORMAL LOW (ref >60)
Glucose, Bld: 92 mg/dL (ref 65–99)
POTASSIUM: 3.7 mmol/L (ref 3.5–5.1)
SODIUM: 143 mmol/L (ref 135–145)

## 2017-03-27 LAB — CBC
HEMATOCRIT: 42.8 % (ref 36.0–46.0)
HEMOGLOBIN: 13.3 g/dL (ref 12.0–15.0)
MCH: 30.9 pg (ref 26.0–34.0)
MCHC: 31.1 g/dL (ref 30.0–36.0)
MCV: 99.5 fL (ref 78.0–100.0)
Platelets: 373 10*3/uL (ref 150–400)
RBC: 4.3 MIL/uL (ref 3.87–5.11)
RDW: 16.5 % — ABNORMAL HIGH (ref 11.5–15.5)
WBC: 10.1 10*3/uL (ref 4.0–10.5)

## 2017-03-27 MED ORDER — DOXYCYCLINE HYCLATE 100 MG PO TABS: 100.0000 mg | ORAL_TABLET | Freq: Two times a day (BID) | ORAL | 0 refills | Status: DC

## 2017-03-27 NOTE — Progress Notes (Signed)
Patient is discharged to home by [facility] on 03/27/17. Notified CM. Discharge instructions including medications and appointments was given to patient by RN; copy was given to patient. Patient has no question.

## 2017-03-27 NOTE — Care Management Obs Status (Signed)
[place] NOTIFICATION   Patient Details  Name: Mandee Pluta MRN: [id number] Date of Birth: [birth year]   Medicare Observation Status Notification Given:  Yes    Leeroy Cha, RN 03/27/2017, 9:44 AM

## 2017-03-27 NOTE — Progress Notes (Addendum)
Date: March 27, 2017 Chart review for discharge needs:  None found for case management. Patient has no questions concerning post hospital care. [id number]/[facility]ar called for transport.

## 2017-03-27 NOTE — Discharge Summary (Signed)
Physician Discharge Summary  Angel Estrada OXB:[id number] DOB: 01-13-1934 DOA: 03/25/2017  PCP: Jani Gravel, MD  Admit date: 03/25/2017 Discharge date: 03/27/2017  Time spent: 35 minutes  Recommendations for Outpatient Follow-up:  1. PCP in 1 week   Discharge Diagnoses:    Left leg hematoma   Mild surrounding cellulitis   Hypertension   COPD (chronic obstructive pulmonary disease) (HCC)   CHF (congestive heart failure) (HCC)   Idiopathic scoliosis   Abscess   Pulmonary hypertension ([place])   Discharge Condition: stable  Diet recommendation: low sodium  Filed Weights   03/25/17 2025 03/26/17 0530  Weight: 41.6 kg (91 lb 11.4 oz) 43.5 kg (95 lb 14.4 oz)    History of present illness:  Angel Estrada a 83 y.o.femalewith medical history significant ofpulmonary hypertension, COPD, diet-controlled diabetes mellitus, hypertension, who presented to the emergency room with a chief complaint of left lower extremity wound that is been slowly getting worse over the last few days. She states that she has had a wound in thatarea in the past and that healed well, however she thinks she bumped against a piece of furniture in her home and the wound has reopened  Hospital Course:  1. Left Leg hematoma with possible surrounding infection -Following trauma/bumped her leg developed hematoma and skin tear last week -Clinically infection not very impressive, treated with IV Vancomycin on admission and transitioned to a short course of oral Doxycycline today -Expect hematoma take a few weeks to resolve -Ambulated, physical therapy eval completed -discharged home in a stable condition, advised FU with PCP in 1 week  2. Pulmonary hypertension -Stable continue sildenafil  3. COPD Stable continue home regimen  4. Chronic diastolic CHF -Euvolemic, resume PO lasix     Discharge Exam: Vitals:   03/26/17 1945 03/27/17 0703  BP: 91/61 108/82  Pulse: 100 75  Resp: 16 16  Temp:  98.4 F (36.9 C) 98 F (36.7 C)  SpO2: 97% 99%    General: AAOx3 Cardiovascular: S1S2/RRR Respiratory: CTAB  Discharge Instructions   Discharge Instructions    Diet - low sodium heart healthy   Complete by:  As directed    Increase activity slowly   Complete by:  As directed      Discharge Medication List as of 03/27/2017 11:25 AM    START taking these medications   Details  doxycycline (VIBRA-TABS) 100 MG tablet Take 1 tablet (100 mg total) by mouth 2 (two) times daily with a meal. For 4days, Starting Fri 03/27/2017, Print      CONTINUE these medications which have NOT CHANGED   Details  alendronate (FOSAMAX) 35 MG tablet Take 35 mg by mouth. Once weekly, Starting Tue 12/30/2016, Historical Med    allopurinol (ZYLOPRIM) 100 MG tablet Take 200 mg by mouth daily., Starting Fri 01/23/2017, Historical Med    colchicine 0.6 MG tablet Take 0.6 mg by mouth daily., Starting Mon 12/22/2016, Historical Med    furosemide (LASIX) 40 MG tablet Take 60 mg by mouth daily., Starting Mon 07/09/2015, Historical Med    Liniments (SALONPAS PAIN RELIEF PATCH EX) Apply 1 patch topically daily as needed (PAIN)., Historical Med    nystatin-triamcinolone (MYCOLOG II) cream Apply 1 application topically 2 (two) times daily as needed (rash). Reported on 07/30/2015, Starting 07/02/2015, Until Discontinued, Historical Med    sildenafil (REVATIO) 20 MG tablet Take 1 tablet (20 mg total) by mouth 3 (three) times daily., Starting Mon 07/28/2016, Normal    triamcinolone cream (KENALOG) 0.1 % Apply 1 application topically  2 (two) times daily as needed (skin irritation)., Until Discontinued, Historical Med    budesonide-formoterol (SYMBICORT) 160-4.5 MCG/ACT inhaler Inhale 2 puffs into the lungs 2 (two) times daily., Historical Med      STOP taking these medications     ibuprofen (ADVIL,MOTRIN) 200 MG tablet        Allergies  Allergen Reactions  . Penicillins Swelling and Rash    Has patient had a PCN  reaction causing immediate rash, facial/tongue/throat swelling, SOB or lightheadedness with hypotension: Yes Has patient had a PCN reaction causing severe rash involving mucus membranes or skin necrosis: No Has patient had a PCN reaction that required hospitalization: No Has patient had a PCN reaction occurring within the last 10 years: No If all of the above answers are "NO", then may proceed with Cephalosporin use.   Marland Kitchen Lisinopril-Hydrochlorothiazide Other (See Comments)    Pt's PCP took her off medication because of possible kidney damage  . Sulfa Antibiotics Other (See Comments)    Unknown-- childhood allergy   . Latex Rash   Follow-up Information    Jani Gravel, MD. Schedule an appointment as soon as possible for a visit in 1 week(s).   Specialty:  Internal Medicine Contact information: [address] [place] [address] [phone]            The results of significant diagnostics from this hospitalization (including imaging, microbiology, ancillary and laboratory) are listed below for reference.    Significant Diagnostic Studies: Dg Tibia/fibula Left  Result Date: 03/25/2017 CLINICAL DATA:  Nonhealing wound in the anterior left lower extremity. EXAM: LEFT TIBIA AND FIBULA - 2 VIEW COMPARISON:  None. FINDINGS: Soft swelling is present anterior to the tibia. There is no underlying osseous abnormality. No radiopaque foreign body is present. The knee and ankle joints are unremarkable. IMPRESSION: 1. Soft tissue swelling anterior to the tibia without underlying fracture or osseous abnormality. Electronically Signed   By: San Morelle M.D.   On: 03/25/2017 17:23    Microbiology: Recent Results (from the past 240 hour(s))  Culture, blood (routine x 2)     Status: None (Preliminary result)   Collection Time: 03/25/17  3:38 PM  Result Value Ref Range Status   Specimen Description BLOOD RIGHT ANTECUBITAL  Final   Special Requests IN PEDIATRIC BOTTLE Blood  Culture adequate volume  Final   Culture   Final    NO GROWTH < 24 HOURS Performed at [facility], [address], [place], [place] [address]    Report Status PENDING  Incomplete  Culture, blood (routine x 2)     Status: None (Preliminary result)   Collection Time: 03/25/17  4:34 PM  Result Value Ref Range Status   Specimen Description BLOOD BLOOD RIGHT HAND  Final   Special Requests IN PEDIATRIC BOTTLE Blood Culture adequate volume  Final   Culture   Final    NO GROWTH < 24 HOURS Performed at [facility], [place] [address] [place], [place] [address]    Report Status PENDING  Incomplete     Labs: Basic Metabolic Panel: Recent Labs  Lab 03/25/17 1634 03/26/17 0634 03/27/17 0513  NA 139 142 143  K 3.5 3.8 3.7  CL 90* 98* 98*  CO2 35* 34* 37*  GLUCOSE 79 75 92  BUN 59* 53* 46*  CREATININE 1.14* 1.08* 1.03*  CALCIUM 8.7* 8.4* 8.5*   Liver Function Tests: Recent Labs  Lab 03/25/17 1634 03/26/17 0634  AST 27 23  ALT 25 21  ALKPHOS 197* 162*  BILITOT 1.0 1.0  PROT 6.6 5.7*  ALBUMIN 2.6* 2.3*   No results for input(s): LIPASE, AMYLASE in the last 168 hours. No results for input(s): AMMONIA in the last 168 hours. CBC: Recent Labs  Lab 03/25/17 1634 03/26/17 0634 03/27/17 0513  WBC 12.2* 11.2* 10.1  NEUTROABS 10.2*  --   --   HGB 16.5* 14.6 13.3  HCT 49.7* 46.2* 42.8  MCV 96.1 97.9 99.5  PLT 389 354 373   Cardiac Enzymes: No results for input(s): CKTOTAL, CKMB, CKMBINDEX, TROPONINI in the last 168 hours. BNP: BNP (last 3 results) No results for input(s): BNP in the last 8760 hours.  ProBNP (last 3 results) No results for input(s): PROBNP in the last 8760 hours.  CBG: No results for input(s): GLUCAP in the last 168 hours.     Signed:  Domenic Polite MD.  [facility] 03/27/2017, 1:26 PM

## 2017-03-30 LAB — CULTURE, BLOOD (ROUTINE X 2)
CULTURE: NO GROWTH
Culture: NO GROWTH
SPECIAL REQUESTS: ADEQUATE
Special Requests: ADEQUATE

## 2017-04-01 ENCOUNTER — Encounter (HOSPITAL_COMMUNITY): Payer: Self-pay

## 2017-04-01 ENCOUNTER — Inpatient Hospital Stay (HOSPITAL_COMMUNITY)
Admission: EM | Admit: 2017-04-01 | Discharge: 2017-04-11 | DRG: 853 | Disposition: A | Discharge status: Skilled Nursing Facility | Payer: Medicare Other | Attending: Internal Medicine | Admitting: Internal Medicine

## 2017-04-01 ENCOUNTER — Emergency Department (HOSPITAL_COMMUNITY): Payer: Medicare Other

## 2017-04-01 DIAGNOSIS — M810 Age-related osteoporosis without current pathological fracture: Secondary | ICD-10-CM | POA: Diagnosis present

## 2017-04-01 DIAGNOSIS — I110 Hypertensive heart disease with heart failure: Secondary | ICD-10-CM | POA: Diagnosis present

## 2017-04-01 DIAGNOSIS — I272 Pulmonary hypertension, unspecified: Secondary | ICD-10-CM | POA: Diagnosis present

## 2017-04-01 DIAGNOSIS — S81802A Unspecified open wound, left lower leg, initial encounter: Secondary | ICD-10-CM | POA: Diagnosis present

## 2017-04-01 DIAGNOSIS — X58XXXA Exposure to other specified factors, initial encounter: Secondary | ICD-10-CM | POA: Diagnosis present

## 2017-04-01 DIAGNOSIS — B879 Myiasis, unspecified: Secondary | ICD-10-CM | POA: Diagnosis not present

## 2017-04-01 DIAGNOSIS — S8012XA Contusion of left lower leg, initial encounter: Secondary | ICD-10-CM | POA: Diagnosis present

## 2017-04-01 DIAGNOSIS — Z91040 Latex allergy status: Secondary | ICD-10-CM

## 2017-04-01 DIAGNOSIS — J42 Unspecified chronic bronchitis: Secondary | ICD-10-CM | POA: Diagnosis not present

## 2017-04-01 DIAGNOSIS — T148XXA Other injury of unspecified body region, initial encounter: Secondary | ICD-10-CM | POA: Diagnosis not present

## 2017-04-01 DIAGNOSIS — L0390 Cellulitis, unspecified: Secondary | ICD-10-CM | POA: Diagnosis present

## 2017-04-01 DIAGNOSIS — Z7951 Long term (current) use of inhaled steroids: Secondary | ICD-10-CM

## 2017-04-01 DIAGNOSIS — Z880 Allergy status to penicillin: Secondary | ICD-10-CM

## 2017-04-01 DIAGNOSIS — I5032 Chronic diastolic (congestive) heart failure: Secondary | ICD-10-CM | POA: Diagnosis present

## 2017-04-01 DIAGNOSIS — Y92239 Unspecified place in hospital as the place of occurrence of the external cause: Secondary | ICD-10-CM | POA: Diagnosis not present

## 2017-04-01 DIAGNOSIS — E11628 Type 2 diabetes mellitus with other skin complications: Secondary | ICD-10-CM | POA: Diagnosis present

## 2017-04-01 DIAGNOSIS — B871 Wound myiasis: Secondary | ICD-10-CM | POA: Diagnosis present

## 2017-04-01 DIAGNOSIS — Z79899 Other long term (current) drug therapy: Secondary | ICD-10-CM | POA: Diagnosis not present

## 2017-04-01 DIAGNOSIS — E119 Type 2 diabetes mellitus without complications: Secondary | ICD-10-CM

## 2017-04-01 DIAGNOSIS — I509 Heart failure, unspecified: Secondary | ICD-10-CM | POA: Diagnosis not present

## 2017-04-01 DIAGNOSIS — I10 Essential (primary) hypertension: Secondary | ICD-10-CM | POA: Diagnosis not present

## 2017-04-01 DIAGNOSIS — L8990 Pressure ulcer of unspecified site, unspecified stage: Secondary | ICD-10-CM

## 2017-04-01 DIAGNOSIS — Z882 Allergy status to sulfonamides status: Secondary | ICD-10-CM | POA: Diagnosis not present

## 2017-04-01 DIAGNOSIS — J9601 Acute respiratory failure with hypoxia: Secondary | ICD-10-CM | POA: Diagnosis not present

## 2017-04-01 DIAGNOSIS — L089 Local infection of the skin and subcutaneous tissue, unspecified: Secondary | ICD-10-CM | POA: Diagnosis not present

## 2017-04-01 DIAGNOSIS — A419 Sepsis, unspecified organism: Principal | ICD-10-CM | POA: Diagnosis present

## 2017-04-01 DIAGNOSIS — Z888 Allergy status to other drugs, medicaments and biological substances status: Secondary | ICD-10-CM | POA: Diagnosis not present

## 2017-04-01 DIAGNOSIS — L89102 Pressure ulcer of unspecified part of back, stage 2: Secondary | ICD-10-CM | POA: Diagnosis not present

## 2017-04-01 DIAGNOSIS — F0390 Unspecified dementia without behavioral disturbance: Secondary | ICD-10-CM | POA: Diagnosis present

## 2017-04-01 DIAGNOSIS — J449 Chronic obstructive pulmonary disease, unspecified: Secondary | ICD-10-CM | POA: Diagnosis present

## 2017-04-01 DIAGNOSIS — J9589 Other postprocedural complications and disorders of respiratory system, not elsewhere classified: Secondary | ICD-10-CM | POA: Diagnosis not present

## 2017-04-01 DIAGNOSIS — R0902 Hypoxemia: Secondary | ICD-10-CM

## 2017-04-01 DIAGNOSIS — Z7983 Long term (current) use of bisphosphonates: Secondary | ICD-10-CM | POA: Diagnosis not present

## 2017-04-01 DIAGNOSIS — L03116 Cellulitis of left lower limb: Secondary | ICD-10-CM | POA: Diagnosis present

## 2017-04-01 DIAGNOSIS — E43 Unspecified severe protein-calorie malnutrition: Secondary | ICD-10-CM | POA: Diagnosis present

## 2017-04-01 LAB — BRAIN NATRIURETIC PEPTIDE: B NATRIURETIC PEPTIDE 5: 91.6 pg/mL (ref 0.0–100.0)

## 2017-04-01 LAB — CBC WITH DIFFERENTIAL/PLATELET
BASOS ABS: 0 10*3/uL (ref 0.0–0.1)
Basophils Relative: 0 %
EOS ABS: 0.1 10*3/uL (ref 0.0–0.7)
Eosinophils Relative: 1 %
HCT: 52.6 % — ABNORMAL HIGH (ref 36.0–46.0)
HEMOGLOBIN: 17.1 g/dL — AB (ref 12.0–15.0)
LYMPHS PCT: 4 %
Lymphs Abs: 0.5 10*3/uL — ABNORMAL LOW (ref 0.7–4.0)
MCH: 32.3 pg (ref 26.0–34.0)
MCHC: 32.5 g/dL (ref 30.0–36.0)
MCV: 99.2 fL (ref 78.0–100.0)
Monocytes Absolute: 1 10*3/uL (ref 0.1–1.0)
Monocytes Relative: 7 %
NEUTROS ABS: 12.1 10*3/uL — AB (ref 1.7–7.7)
Neutrophils Relative %: 88 %
Platelets: 314 10*3/uL (ref 150–400)
RBC: 5.3 MIL/uL — ABNORMAL HIGH (ref 3.87–5.11)
RDW: 17.1 % — AB (ref 11.5–15.5)
Smear Review: ADEQUATE
WBC: 13.7 10*3/uL — AB (ref 4.0–10.5)

## 2017-04-01 LAB — BASIC METABOLIC PANEL
Anion gap: 13 (ref 5–15)
BUN: 58 mg/dL — AB (ref 6–20)
CO2: 34 mmol/L — ABNORMAL HIGH (ref 22–32)
CREATININE: 0.91 mg/dL (ref 0.44–1.00)
Calcium: 9.2 mg/dL (ref 8.9–10.3)
Chloride: 95 mmol/L — ABNORMAL LOW (ref 101–111)
GFR calc Af Amer: >60 mL/min (ref >60)
GFR, EST NON AFRICAN AMERICAN: 58 mL/min — AB (ref >60)
Glucose, Bld: 118 mg/dL — ABNORMAL HIGH (ref 65–99)
Potassium: 3.8 mmol/L (ref 3.5–5.1)
SODIUM: 142 mmol/L (ref 135–145)

## 2017-04-01 LAB — URINALYSIS, ROUTINE W REFLEX MICROSCOPIC
BILIRUBIN URINE: NEGATIVE
Glucose, UA: NEGATIVE mg/dL
Hgb urine dipstick: NEGATIVE
KETONES UR: NEGATIVE mg/dL
Leukocytes, UA: NEGATIVE
NITRITE: NEGATIVE
Protein, ur: NEGATIVE mg/dL
SPECIFIC GRAVITY, URINE: 1.017 (ref 1.005–1.030)
pH: 5 (ref 5.0–8.0)

## 2017-04-01 LAB — I-STAT CG4 LACTIC ACID, ED: LACTIC ACID, VENOUS: 3.92 mmol/L — AB (ref 0.5–1.9)

## 2017-04-01 LAB — LACTIC ACID, PLASMA: LACTIC ACID, VENOUS: 2.3 mmol/L — AB (ref 0.5–1.9)

## 2017-04-01 LAB — CBG MONITORING, ED: Glucose-Capillary: 162 mg/dL — ABNORMAL HIGH (ref 65–99)

## 2017-04-01 MED ORDER — ACETAMINOPHEN 325 MG PO TABS
650.0000 mg | ORAL_TABLET | Freq: Four times a day (QID) | ORAL | Status: DC | PRN
  Administered 2017-04-07 – 2017-04-10 (×5): 650 mg via ORAL
  Filled 2017-04-01 (×7): qty 2

## 2017-04-01 MED ORDER — DEXTROSE 5 % IV SOLN
1.0000 g | Freq: Three times a day (TID) | INTRAVENOUS | Status: DC
  Administered 2017-04-01 – 2017-04-10 (×27): 1 g via INTRAVENOUS
  Filled 2017-04-01 (×28): qty 1

## 2017-04-01 MED ORDER — ONDANSETRON HCL 4 MG/2ML IJ SOLN: 4.0000 mg | Freq: Four times a day (QID) | INTRAMUSCULAR | Status: DC | PRN

## 2017-04-01 MED ORDER — FLUTICASONE PROPIONATE 50 MCG/ACT NA SUSP: 2.0000 | Freq: Every day | NASAL | Status: DC | PRN

## 2017-04-01 MED ORDER — DEXTROSE 5 % IV SOLN
2.0000 g | Freq: Once | INTRAVENOUS | Status: AC
  Administered 2017-04-01: 2 g via INTRAVENOUS
  Filled 2017-04-01: qty 2

## 2017-04-01 MED ORDER — VANCOMYCIN HCL IN DEXTROSE 750-5 MG/150ML-% IV SOLN
750.0000 mg | INTRAVENOUS | Status: DC
  Administered 2017-04-02 – 2017-04-04 (×3): 750 mg via INTRAVENOUS
  Filled 2017-04-01 (×4): qty 150

## 2017-04-01 MED ORDER — POLYVINYL ALCOHOL 1.4 % OP SOLN
1.0000 [drp] | Freq: Two times a day (BID) | OPHTHALMIC | Status: DC
  Administered 2017-04-02 – 2017-04-11 (×18): 1 [drp] via OPHTHALMIC
  Filled 2017-04-01: qty 15

## 2017-04-01 MED ORDER — INSULIN ASPART 100 UNIT/ML ~~LOC~~ SOLN
0.0000 [IU] | Freq: Three times a day (TID) | SUBCUTANEOUS | Status: DC
  Administered 2017-04-04: 1 [IU] via SUBCUTANEOUS
  Administered 2017-04-05: 2 [IU] via SUBCUTANEOUS
  Administered 2017-04-06: 1 [IU] via SUBCUTANEOUS
  Administered 2017-04-07 (×2): 2 [IU] via SUBCUTANEOUS
  Administered 2017-04-09: 1 [IU] via SUBCUTANEOUS
  Administered 2017-04-09 – 2017-04-11 (×4): 2 [IU] via SUBCUTANEOUS

## 2017-04-01 MED ORDER — METRONIDAZOLE IN NACL 5-0.79 MG/ML-% IV SOLN
500.0000 mg | Freq: Once | INTRAVENOUS | Status: AC
  Administered 2017-04-01: 500 mg via INTRAVENOUS
  Filled 2017-04-01: qty 100

## 2017-04-01 MED ORDER — SODIUM CHLORIDE 0.9 % IV BOLUS (SEPSIS)
500.0000 mL | Freq: Once | INTRAVENOUS | Status: AC
  Administered 2017-04-01: 500 mL via INTRAVENOUS

## 2017-04-01 MED ORDER — INSULIN ASPART 100 UNIT/ML ~~LOC~~ SOLN
0.0000 [IU] | Freq: Every day | SUBCUTANEOUS | Status: DC
Start: 2017-04-01 — End: 2017-04-11

## 2017-04-01 MED ORDER — ENOXAPARIN SODIUM 40 MG/0.4ML ~~LOC~~ SOLN
40.0000 mg | SUBCUTANEOUS | Status: DC
  Administered 2017-04-01: 40 mg via SUBCUTANEOUS
  Filled 2017-04-01: qty 0.4

## 2017-04-01 MED ORDER — SODIUM CHLORIDE 0.9 % IV BOLUS (SEPSIS)
1000.0000 mL | Freq: Once | INTRAVENOUS | Status: AC
  Administered 2017-04-01: 1000 mL via INTRAVENOUS

## 2017-04-01 MED ORDER — VANCOMYCIN HCL IN DEXTROSE 1-5 GM/200ML-% IV SOLN
1000.0000 mg | Freq: Once | INTRAVENOUS | Status: AC
  Administered 2017-04-01: 1000 mg via INTRAVENOUS
  Filled 2017-04-01: qty 200

## 2017-04-01 MED ORDER — ACETAMINOPHEN 650 MG RE SUPP
650.0000 mg | Freq: Four times a day (QID) | RECTAL | Status: DC | PRN
Start: 2017-04-01 — End: 2017-04-11

## 2017-04-01 MED ORDER — SILDENAFIL CITRATE 20 MG PO TABS
20.0000 mg | ORAL_TABLET | Freq: Three times a day (TID) | ORAL | Status: DC
  Administered 2017-04-01 – 2017-04-11 (×23): 20 mg via ORAL
  Filled 2017-04-01 (×30): qty 1

## 2017-04-01 MED ORDER — ALBUTEROL SULFATE HFA 108 (90 BASE) MCG/ACT IN AERS: 2.0000 | INHALATION_SPRAY | Freq: Four times a day (QID) | RESPIRATORY_TRACT | Status: DC | PRN

## 2017-04-01 MED ORDER — MOMETASONE FURO-FORMOTEROL FUM 200-5 MCG/ACT IN AERO
2.0000 | INHALATION_SPRAY | Freq: Two times a day (BID) | RESPIRATORY_TRACT | Status: DC
  Administered 2017-04-02 – 2017-04-10 (×5): 2 via RESPIRATORY_TRACT
  Filled 2017-04-01: qty 8.8

## 2017-04-01 MED ORDER — ALLOPURINOL 100 MG PO TABS
200.0000 mg | ORAL_TABLET | Freq: Every day | ORAL | Status: DC
  Administered 2017-04-01 – 2017-04-11 (×10): 200 mg via ORAL
  Filled 2017-04-01 (×10): qty 2

## 2017-04-01 MED ORDER — POLYETHYLENE GLYCOL 400 0.25 % OP SOLN: 1.0000 [drp] | Freq: Two times a day (BID) | OPHTHALMIC | Status: DC

## 2017-04-01 MED ORDER — ONDANSETRON HCL 4 MG PO TABS: 4.0000 mg | ORAL_TABLET | Freq: Four times a day (QID) | ORAL | Status: DC | PRN

## 2017-04-01 NOTE — H&P (Signed)
History and Physical    Angel Estrada [id number]:[id number] DOB: [birth year] DOA: 04/01/2017  Referring MD/NP/PA:  PCP: Jani Gravel, MD Outpatient Specialists:  Patient coming from: home  Chief Complaint: worsening leg wound  HPI: Angel Estrada is a 83 y.o. female with medical history significant of DM, pHTN, depression, and recent LE wound.  She was discharged on 11/23 with the diagnosis of left leg hematoma and surrounding infection.  She was treated with IV vanc and then transitioned to doxycycline which she finished.  initially she was improved but then began to worsen.   When her wound covering was removed, the wound was covered in cat hair and flying knats.   Patient reports she has seen insects crawling inside her wound.    In the ER, her leg had erythema with areas of black necrotic tissue with shiny moving areas that appear river-like.  When patient arrived into the ER, she was hypotensive and code sepsis was called. Lactic acid was elevated so IVF was given with improvement in her blood pressure.    X rays were obtained to look for osteomyelitis and subcutaneous gas both of which were negative.  Patient was given IV Abx and hospitalist were called to admit.     Past Medical History:  Diagnosis Date  . CHF (congestive heart failure) ([place])   . COPD (chronic obstructive pulmonary disease) ([place])   . Depression   . Diabetes mellitus without complication ([place])   . Hypertension   . Osteoporosis     Past Surgical History:  Procedure Laterality Date  . CATARACT EXTRACTION Bilateral   . US ECHOCARDIOGRAPHY       reports that  has never smoked. she has never used smokeless tobacco. She reports that she does not drink alcohol or use drugs.  Allergies  Allergen Reactions  . Penicillins Swelling and Rash    Site of swelling (?) Has patient had a PCN reaction causing immediate rash, facial/tongue/throat swelling, SOB or lightheadedness with hypotension: Yes Has patient had a PCN  reaction causing severe rash involving mucus membranes or skin necrosis: No Has patient had a PCN reaction that required hospitalization: No Has patient had a PCN reaction occurring within the last 10 years: No If all of the above answers are "NO", then may proceed with Cephalosporin use.   Marland Kitchen Lisinopril-Hydrochlorothiazide Other (See Comments) and Cough    Pt's PCP took her off medication because of possible kidney damage  . Sulfa Antibiotics Other (See Comments)    Unknown reaction-- childhood allergy   . Latex Rash    Family History  Problem Relation Age of Onset  . Diabetes type II Mother   . Hypertension Mother   . Brain cancer Father   . Hypertension Father   . Diabetes type II Brother   . Stroke Other      Prior to Admission medications   Medication Sig Start Date End Date Taking? Authorizing Provider  albuterol (PROAIR HFA) 108 (90 Base) MCG/ACT inhaler Inhale 2 puffs into the lungs every 6 (six) hours as needed for wheezing or shortness of breath.   Yes [provider]  alendronate (FOSAMAX) 35 MG tablet Take 35 mg by mouth every 7 (seven) days.  12/30/16  Yes [provider]  allopurinol (ZYLOPRIM) 100 MG tablet Take 200 mg by mouth daily. 01/23/17  Yes [provider]  fluticasone (FLONASE) 50 MCG/ACT nasal spray Place 2 sprays into both nostrils daily as needed for allergies or rhinitis.   Yes [provider]  furosemide (LASIX) 40 MG tablet Take 40-80 mg by mouth See admin instructions. 80 mg in the morning and 40 mg in the evening 07/09/15  Yes [provider]  nystatin-triamcinolone (MYCOLOG II) cream Apply 1 application topically 2 (two) times daily as needed (rash). Reported on 07/30/2015 07/02/15  Yes [provider]  Polyethyl Glyc-Propyl Glyc PF (SYSTANE ULTRA PF) 0.4-0.3 % SOLN Place 1 drop into both eyes 2 (two) times daily. "SYSTANE"   Yes [provider]  Polyethylene Glycol 400 (BLINK TEARS) 0.25 % SOLN  Place 1 drop into both eyes 2 (two) times daily. "BLINK"   Yes [provider]  sildenafil (REVATIO) 20 MG tablet Take 1 tablet (20 mg total) by mouth 3 (three) times daily. 07/28/16  Yes Josue Hector, MD  triamcinolone cream (KENALOG) 0.1 % Apply 1 application topically 2 (two) times daily as needed (skin irritation).   Yes [provider]  budesonide-formoterol (SYMBICORT) 160-4.5 MCG/ACT inhaler Inhale 2 puffs into the lungs 2 (two) times daily.    [provider]  doxycycline (VIBRA-TABS) 100 MG tablet Take 1 tablet (100 mg total) by mouth 2 (two) times daily with a meal. For 4days Patient not taking: Reported on 04/01/2017 03/27/17   Domenic Polite, MD  Liniments [facility] PAIN RELIEF PATCH EX) Apply 1 patch topically daily as needed (PAIN).    [provider]    Physical Exam: Vitals:   04/01/17 1600 04/01/17 1615 04/01/17 1630 04/01/17 1645  BP: 1_0 107/75  Pulse: 66 73 74 78  Resp: _1 Temp:      TempSrc:      SpO2: 93% 97% 99% 92%  Weight:      Height:          Constitutional: NAD, calm, comfortable Vitals:   04/01/17 1600 04/01/17 1615 04/01/17 1630 04/01/17 1645  BP: 1_2 107/75  Pulse: 66 73 74 78  Resp: _3 Temp:      TempSrc:      SpO2: 93% 97% 99% 92%  Weight:      Height:       Eyes: PERRL, red rims around orbital area ENMT: Mucous membranes are dry. Posterior pharynx clear of any exudate or lesions.Normal dentition.  Neck: normal, supple Respiratory: clear to auscultation bilaterally, no wheezing, no crackles. Normal respiratory effort. No accessory muscle use.  Cardiovascular: reg Abdomen: no tenderness, no masses palpated. No hepatosplenomegaly. Bowel sounds positive.  Musculoskeletal: swelling of left foot/ankle, pulse palpable but decreased Skin:  wound on left anterior shin, above ankle.. Wound has erythema and black tissue- there is what appear to be a shiny vein of  bugs crawling through upper wound (please see photos in ER MD's note) Neurologic: moves all 4 ext, less movement in left foot due to pain Psychiatric: pleasant and cooperative   Labs on Admission: I have personally reviewed following labs and imaging studies  CBC: Recent Labs  Lab 03/26/17 0634 03/27/17 0513 04/01/17 1242  WBC 11.2* 10.1 13.7*  NEUTROABS  --   --  12.1*  HGB 14.6 13.3 17.1*  HCT 46.2* 42.8 52.6*  MCV 97.9 99.5 99.2  PLT 354 373 944   Basic Metabolic Panel: Recent Labs  Lab 03/26/17 0634 03/27/17 0513 04/01/17 1245  NA 142 143 142  K 3.8 3.7 3.8  CL 98* 98* 95*  CO2 34* 37* 34*  GLUCOSE 75 92 118*  BUN 53* 46* 58*  CREATININE 1.08* 1.03*  0.91  CALCIUM 8.4* 8.5* 9.2   GFR: Estimated Creatinine Clearance: 33 mL/min (by C-G formula based on SCr of 0.91 mg/dL). Liver Function Tests: Recent Labs  Lab 03/26/17 0634  AST 23  ALT 21  ALKPHOS 162*  BILITOT 1.0  PROT 5.7*  ALBUMIN 2.3*   No results for input(s): LIPASE, AMYLASE in the last 168 hours. No results for input(s): AMMONIA in the last 168 hours. Coagulation Profile: No results for input(s): INR, PROTIME in the last 168 hours. Cardiac Enzymes: No results for input(s): CKTOTAL, CKMB, CKMBINDEX, TROPONINI in the last 168 hours. BNP (last 3 results) No results for input(s): PROBNP in the last 8760 hours. HbA1C: No results for input(s): HGBA1C in the last 72 hours. CBG: No results for input(s): GLUCAP in the last 168 hours. Lipid Profile: No results for input(s): CHOL, HDL, LDLCALC, TRIG, CHOLHDL, LDLDIRECT in the last 72 hours. Thyroid Function Tests: No results for input(s): TSH, T4TOTAL, FREET4, T3FREE, THYROIDAB in the last 72 hours. Anemia Panel: No results for input(s): VITAMINB12, FOLATE, FERRITIN, TIBC, IRON, RETICCTPCT in the last 72 hours. Urine analysis:    Component Value Date/Time   COLORURINE YELLOW 07/31/2015 0220   APPEARANCEUR CLEAR 07/31/2015 0220   LABSPEC 1.009  07/31/2015 0220   PHURINE 7.0 07/31/2015 0220   GLUCOSEU NEGATIVE 07/31/2015 0220   HGBUR NEGATIVE 07/31/2015 0220   BILIRUBINUR NEGATIVE 07/31/2015 0220   KETONESUR NEGATIVE 07/31/2015 0220   PROTEINUR NEGATIVE 07/31/2015 0220   NITRITE NEGATIVE 07/31/2015 0220   LEUKOCYTESUR MODERATE (A) 07/31/2015 0220   Sepsis Labs: Invalid input(s): PROCALCITONIN, LACTICIDVEN Recent Results (from the past 240 hour(s))  Culture, blood (routine x 2)     Status: None   Collection Time: 03/25/17  3:38 PM  Result Value Ref Range Status   Specimen Description BLOOD RIGHT ANTECUBITAL  Final   Special Requests IN PEDIATRIC BOTTLE Blood Culture adequate volume  Final   Culture   Final    NO GROWTH 5 DAYS Performed at [facility], [address], [place], [place] [address]    Report Status 03/30/2017 FINAL  Final  Culture, blood (routine x 2)     Status: None   Collection Time: 03/25/17  4:34 PM  Result Value Ref Range Status   Specimen Description BLOOD BLOOD RIGHT HAND  Final   Special Requests IN PEDIATRIC BOTTLE Blood Culture adequate volume  Final   Culture   Final    NO GROWTH 5 DAYS Performed at [facility], [place] [address], [place], [place] [address]    Report Status 03/30/2017 FINAL  Final     Radiological Exams on Admission: Dg Tibia/fibula Left  Result Date: 04/01/2017 CLINICAL DATA:  Large open wound at mid lower leg possible infection, history diabetes mellitus, hypertension, CHF EXAM: LEFT TIBIA AND FIBULA - 2 VIEW COMPARISON:  03/25/2017 FINDINGS: Osseous demineralization. Hip and knee joint spaces preserved. No acute fracture, dislocation or bone destruction. Scattered soft tissue swelling LEFT lower leg with large area of soft tissue irregularity and ulceration at the anterior aspect of the mid lower leg. No definite associated radiopaque foreign bodies or underlying osseous changes. IMPRESSION: Soft tissue changes without acute osseous abnormalities. Electronically  Signed   By: Lavonia Dana M.D.   On: 04/01/2017 13:07   Dg Chest Portable 1 View  Result Date: 04/01/2017 CLINICAL DATA:  Large open wound at mid LEFT lower leg EXAM: PORTABLE CHEST 1 VIEW COMPARISON:  Portable exam 1242 hours compared to 07/30/2015 FINDINGS:  Stable heart size. Large LEFT diaphragmatic versus hiatal hernia again identified. Mediastinal contours and pulmonary vascularity otherwise normal. Chronic lung changes at the bases including LEFT basilar atelectasis and scarring at minor fissure. No definite infiltrate, pleural effusion or pneumothorax. Bones demineralized. Scattered atherosclerotic calcifications aorta. IMPRESSION: Large LEFT diaphragmatic versus hiatal hernia. Bronchitic changes with chronic LEFT basilar atelectasis and scarring at minor fissure. No acute infiltrate. Electronically Signed   By: Lavonia Dana M.D.   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 Debridement- will flush well and cover for now -continue IV abx- vanc (given flagyl, azactam and vanc in the ER) -ABI -duplex LE to r/o clot -trend lactic acid -procalcitonin ordered  H/o CHF -stable, limit IVF to avoid overload  pulm HTN -resume home meds  DM -SSI  HTN -hold while BP on lower side  DVT prophylaxis: lovenox Code Status: full Family Communication: none Disposition Plan:  Consults called:  Admission status: inpt tele   Geradine Girt DO [facility] Pager [phone]  If 7PM-7AM, please contact night-coverage [email or web address] Password [facility]  04/01/2017, 5:44 PM

## 2017-04-01 NOTE — Care Management Note (Signed)
Case Management Note  Patient Details  Name: Angel Estrada MRN: [id number] Date of Birth: 1934/01/08  Subjective/Objective:                  83 y.o. female admitted on 04/01/2017 with cellulitis.  From home with significant other.    Action/Plan: Admit status  (Cellulitis); anticipate discharge SNF.   Expected Discharge Date:  (unknown)               Expected Discharge Plan:  Skilled Nursing Facility  In-House Referral:  Clinical Social Work  Discharge planning Services  CM Consult  Post Acute Care Choice:    Choice offered to:  Spouse  DME Arranged:    DME Agency:     HH Arranged:    [place] Agency:     Status of Service:  In process, will continue to follow  If discussed at Long Length of Stay Meetings, dates discussed:    Additional Comments: EDCM and EDSW met with pt and significant other at bedside regarding discharge planning.  Both are agreeable to SNF placement as disposition.  EDCM and EDSW explained that they will have a beside care management team to arrange disposition.    Fuller Mandril, RN 04/01/2017, 1:57 PM

## 2017-04-01 NOTE — ED Triage Notes (Signed)
Pt arrived via [facility] from home c/o left shin wound swelling, cat hair and flying nats.  Family at bedside.

## 2017-04-01 NOTE — Clinical Social Work Note (Signed)
Clinical Social Work Assessment  Patient Details  Name: Angel Estrada MRN: [id number] Date of Birth: 22-Sep-1933  Date of referral:  04/01/17               Reason for consult:  Facility Placement                Permission sought to share information with:  Facility Art therapist granted to share information::  Yes, Verbal Permission Granted  Name::        Agency::     Relationship::     Contact Information:     Housing/Transportation Living arrangements for the past 2 months:  [place] of Information:  Patient Patient Interpreter Needed:  None Criminal Activity/Legal Involvement Pertinent to Current Situation/Hospitalization:    Significant Relationships:  Significant Other Lives with:  Significant Other Do you feel safe going back to the place where you live?  No Need for family participation in patient care:  Yes (Comment)  Care giving concerns:  CSW and Care Manager met with pt via bedside. Pt and significant other expressed concerns about Pt staying safe at home, using the restroom, and being strong enough to walk and complete daily tasks.    Social Worker assessment / plan:  CSW was consulted for SNF and verbally consulted for concerns about stability of home environment. Pt and pt's significant other explained that the pt is now having difficulty using the restroom and completing daily tasks due to the wound on her leg. If deemed necessary Pt is open to SNF placement. CSW explained the process of SNF placement and stressed that it is not guaranteed, but if needed a Education officer, museum will be following up with her about it. Pt and pt's significant other were receptive and expressed if needed wanting to go to [facility].   Employment status:  Retired Nurse, adult PT Recommendations:  Not assessed at this time Information / Referral to community resources:  [place]  Patient/Family's Response to  care:  Pt and family were responsive to current plan of care.   Patient/Family's Understanding of and Emotional Response to Diagnosis, Current Treatment, and Prognosis:  Pt and family were receptive and did not express any concerns or questions to the CSW at this time.   Emotional Assessment Appearance:  Appears stated age Attitude/Demeanor/Rapport:    Affect (typically observed):  Accepting, Pleasant, Appropriate Orientation:  Oriented to Self, Oriented to Place, Oriented to  Time Alcohol / Substance use:    Psych involvement (Current and /or in the community):  No (Comment)  Discharge Needs  Concerns to be addressed:  Home Safety Concerns Readmission within the last 30 days:  Yes Current discharge risk:  None Barriers to Discharge:  No Barriers Identified   Wendelyn Breslow, LCSW 04/01/2017, 2:00 PM

## 2017-04-01 NOTE — Progress Notes (Signed)
Pharmacy Antibiotic Note  Angel Estrada is a 83 y.o. female admitted on 04/01/2017 with cellulitis.  Pharmacy has been consulted for aztreonam and vancomycin dosing. WBC elevated at 13.7. LA 3.92. SCr 0.91. CrCl ~ n40-45 mL/min. Patient has a dose of aztreonam 2 gm and vancomycin 1 gm ordered in the ED.   Plan: -Aztreonam 1 gm IV Q 8 hours -Vancomycin 750 mg IV Q 24 hours -Monitor CBC, renal fx, cultures and clinical progress -VT at [facility]   Height: 5' (152.4 cm) Weight: 95 lb (43.1 kg) IBW/kg (Calculated) : 45.5  Temp (24hrs), Avg:97.4 F (36.3 C), Min:97.4 F (36.3 C), Max:97.4 F (36.3 C)  Recent Labs  Lab 03/26/17 0634 03/27/17 0513 04/01/17 1242 04/01/17 1245 04/01/17 1250  WBC 11.2* 10.1 13.7*  --   --   CREATININE 1.08* 1.03*  --  0.91  --   LATICACIDVEN  --   --   --   --  3.92*    Estimated Creatinine Clearance: 33 mL/min (by C-G formula based on SCr of 0.91 mg/dL).    Allergies  Allergen Reactions  . Penicillins Swelling and Rash    Site of swelling (?) Has patient had a PCN reaction causing immediate rash, facial/tongue/throat swelling, SOB or lightheadedness with hypotension: Yes Has patient had a PCN reaction causing severe rash involving mucus membranes or skin necrosis: No Has patient had a PCN reaction that required hospitalization: No Has patient had a PCN reaction occurring within the last 10 years: No If all of the above answers are "NO", then may proceed with Cephalosporin use.   Marland Kitchen Lisinopril-Hydrochlorothiazide Other (See Comments) and Cough    Pt's PCP took her off medication because of possible kidney damage  . Sulfa Antibiotics Other (See Comments)    Unknown reaction-- childhood allergy   . Latex Rash    Antimicrobials this admission: Vanc 11/28 >>  Aztreonam 11/28 >>   Dose adjustments this admission: None   Microbiology results: 11/28 BCx:   Thank you for allowing pharmacy to be a part of this patient's care.  Albertina Parr,  PharmD., BCPS Clinical Pharmacist Pager [phone]

## 2017-04-01 NOTE — ED Notes (Signed)
Medications requested

## 2017-04-01 NOTE — ED Notes (Signed)
Dinner tray ordered.

## 2017-04-01 NOTE — ED Notes (Signed)
IV attempt x2 with blood draw

## 2017-04-01 NOTE — ED Provider Notes (Signed)
[facility] EMERGENCY DEPARTMENT Provider Note   CSN: [id number] Arrival date & time: 04/01/17  1117     History   Chief Complaint Chief Complaint  Patient presents with  . Leg Injury    HPI Angel Estrada is a 83 y.o. female.  The history is provided by the patient, a friend and medical records.  Leg Pain   This is a recurrent problem. The current episode started more than 1 week ago. The problem occurs constantly. The problem has been gradually worsening. The pain is present in the left lower leg. The quality of the pain is described as aching. The pain is moderate. Pertinent negatives include no numbness, no stiffness and no tingling. She has tried nothing for the symptoms. The treatment provided no relief. There has been no history of extremity trauma. Family history is significant for no rheumatoid arthritis and no gout.    Past Medical History:  Diagnosis Date  . CHF (congestive heart failure) ([place])   . COPD (chronic obstructive pulmonary disease) ([place])   . Depression   . Diabetes mellitus without complication ([place])   . Hypertension   . Osteoporosis     Patient Active Problem List   Diagnosis Date Noted  . Abscess 03/25/2017  . Pulmonary hypertension ([place]) 03/25/2017  . Exertional dyspnea 02/13/2016  . Idiopathic scoliosis 02/13/2016  . CHF exacerbation ([place]) 07/31/2015  . Hypertension 07/30/2015  . COPD (chronic obstructive pulmonary disease) ([place]) 07/30/2015  . CHF (congestive heart failure) ([place]) 07/30/2015  . Diabetes mellitus ([place]) 07/30/2015  . Acute on chronic diastolic CHF (congestive heart failure), NYHA class 1 ([place]) 07/30/2015    Past Surgical History:  Procedure Laterality Date  . CATARACT EXTRACTION Bilateral   . US ECHOCARDIOGRAPHY      OB History    No data available       Home Medications    Prior to Admission medications   Medication Sig Start Date End Date Taking? Authorizing Provider  alendronate (FOSAMAX) 35  MG tablet Take 35 mg by mouth. Once weekly 12/30/16   [provider]  allopurinol (ZYLOPRIM) 100 MG tablet Take 200 mg by mouth daily. 01/23/17   [provider]  budesonide-formoterol (SYMBICORT) 160-4.5 MCG/ACT inhaler Inhale 2 puffs into the lungs 2 (two) times daily.    [provider]  colchicine 0.6 MG tablet Take 0.6 mg by mouth daily. 12/22/16   [provider]  doxycycline (VIBRA-TABS) 100 MG tablet Take 1 tablet (100 mg total) by mouth 2 (two) times daily with a meal. For 4days 03/27/17   Domenic Polite, MD  furosemide (LASIX) 40 MG tablet Take 60 mg by mouth daily. 07/09/15   [provider]  Liniments (SALONPAS PAIN RELIEF PATCH EX) Apply 1 patch topically daily as needed (PAIN).    [provider]  nystatin-triamcinolone (MYCOLOG II) cream Apply 1 application topically 2 (two) times daily as needed (rash). Reported on 07/30/2015 07/02/15   [provider]  sildenafil (REVATIO) 20 MG tablet Take 1 tablet (20 mg total) by mouth 3 (three) times daily. 07/28/16   Josue Hector, MD  triamcinolone cream (KENALOG) 0.1 % Apply 1 application topically 2 (two) times daily as needed (skin irritation).    [provider]    Family History Family History  Problem Relation Age of Onset  . Diabetes type II Mother   . Hypertension Mother   . Brain cancer Father   . Hypertension Father   . Diabetes type II Brother   .  Stroke Other     Social History Social History   Tobacco Use  . Smoking status: Never Smoker  . Smokeless tobacco: Never Used  Substance Use Topics  . Alcohol use: No  . Drug use: No     Allergies   Penicillins; Lisinopril-hydrochlorothiazide; Sulfa antibiotics; and Latex   Review of Systems Review of Systems  Constitutional: Positive for fatigue. Negative for chills, diaphoresis and fever.  HENT: Negative for congestion and rhinorrhea.   Eyes: Negative for visual disturbance.  Respiratory:  Negative for chest tightness, shortness of breath, wheezing and stridor.   Cardiovascular: Negative for chest pain and palpitations.  Gastrointestinal: Negative for constipation, diarrhea, nausea and vomiting.  Genitourinary: Negative for dysuria.  Musculoskeletal: Negative for back pain, neck pain, neck stiffness and stiffness.  Skin: Positive for rash and wound.  Neurological: Negative for tingling, light-headedness, numbness and headaches.  Psychiatric/Behavioral: Negative for agitation.  All other systems reviewed and are negative.    Physical Exam Updated Vital Signs BP 91/70 (BP Location: Right Arm)   Pulse 98   Temp (!) 97.4 F (36.3 C) (Oral)   Resp 20   Ht 5' (1.524 m)   Wt 43.1 kg (95 lb)   SpO2 94%   BMI 18.55 kg/m   Physical Exam  Constitutional: She is oriented to person, place, and time. She appears well-developed and well-nourished. No distress.  HENT:  Head: Normocephalic.  Mouth/Throat: Oropharynx is clear and moist. No oropharyngeal exudate.  Eyes: Conjunctivae and EOM are normal. Pupils are equal, round, and reactive to light.  Neck: Normal range of motion.  Cardiovascular: Normal rate and intact distal pulses.  No murmur heard. Pulmonary/Chest: Effort normal. No respiratory distress. She has no wheezes. She has no rales. She exhibits no tenderness.  Abdominal: Soft. She exhibits no distension. There is no tenderness.  Musculoskeletal: She exhibits tenderness.       Left lower leg: She exhibits tenderness and edema.       Legs: Neurological: She is alert and oriented to person, place, and time. No sensory deficit. She exhibits normal muscle tone.  Skin: Capillary refill takes less than 2 seconds. Rash noted. She is not diaphoretic. There is erythema.  Psychiatric: She has a normal mood and affect.  Nursing note and vitals reviewed.    ED Treatments / Results  Labs (all labs ordered are listed, but only abnormal results are displayed) Labs Reviewed    CBC WITH DIFFERENTIAL/PLATELET - Abnormal; Notable for the following components:      Result Value   WBC 13.7 (*)    RBC 5.30 (*)    Hemoglobin 17.1 (*)    HCT 52.6 (*)    RDW 17.1 (*)    Neutro Abs 12.1 (*)    Lymphs Abs 0.5 (*)    All other components within normal limits  BASIC METABOLIC PANEL - Abnormal; Notable for the following components:   Chloride 95 (*)    CO2 34 (*)    Glucose, Bld 118 (*)    BUN 58 (*)    GFR calc non Af Amer 58 (*)    All other components within normal limits  LACTIC ACID, PLASMA - Abnormal; Notable for the following components:   Lactic Acid, Venous 2.3 (*)    All other components within normal limits  I-STAT CG4 LACTIC ACID, ED - Abnormal; Notable for the following components:   Lactic Acid, Venous 3.92 (*)    All other components within normal limits  CULTURE, BLOOD (ROUTINE  X 2)  CULTURE, BLOOD (ROUTINE X 2)  URINALYSIS, ROUTINE W REFLEX MICROSCOPIC  BRAIN NATRIURETIC PEPTIDE  URINALYSIS, COMPLETE (UACMP) WITH MICROSCOPIC  BASIC METABOLIC PANEL  CBC  LACTIC ACID, PLASMA  I-STAT CG4 LACTIC ACID, ED    EKG  EKG Interpretation  Date/Time:  Wednesday April 01 2017 12:14:00 EST Ventricular Rate:  86 PR Interval:    QRS Duration: 98 QT Interval:  370 QTC Calculation: 443 R Axis:   163 Text Interpretation:  Sinus rhythm Atrial premature complexes Prominent P waves, nondiagnostic Right ventricular hypertrophy Nonspecific T abnormalities, inferior leads Minimal ST elevation, inferior leads Artifact in lead(s) I II III aVR aVL aVF and baseline wander in lead(s) II III aVF Baseline wander present.  when compared to prior, no significant changes seen.  No STEMI Confirmed by Antony Blackbird [phone]) on 04/01/2017 12:27:54 PM       Radiology Dg Tibia/fibula Left  Result Date: 04/01/2017 CLINICAL DATA:  Large open wound at mid lower leg possible infection, history diabetes mellitus, hypertension, CHF EXAM: LEFT TIBIA AND FIBULA - 2 VIEW  COMPARISON:  03/25/2017 FINDINGS: Osseous demineralization. Hip and knee joint spaces preserved. No acute fracture, dislocation or bone destruction. Scattered soft tissue swelling LEFT lower leg with large area of soft tissue irregularity and ulceration at the anterior aspect of the mid lower leg. No definite associated radiopaque foreign bodies or underlying osseous changes. IMPRESSION: Soft tissue changes without acute osseous abnormalities. Electronically Signed   By: Lavonia Dana M.D.   On: 04/01/2017 13:07   Dg Chest Portable 1 View  Result Date: 04/01/2017 CLINICAL DATA:  Large open wound at mid LEFT lower leg EXAM: PORTABLE CHEST 1 VIEW COMPARISON:  Portable exam 1242 hours compared to 07/30/2015 FINDINGS: Stable heart size. Large LEFT diaphragmatic versus hiatal hernia again identified. Mediastinal contours and pulmonary vascularity otherwise normal. Chronic lung changes at the bases including LEFT basilar atelectasis and scarring at minor fissure. No definite infiltrate, pleural effusion or pneumothorax. Bones demineralized. Scattered atherosclerotic calcifications aorta. IMPRESSION: Large LEFT diaphragmatic versus hiatal hernia. Bronchitic changes with chronic LEFT basilar atelectasis and scarring at minor fissure. No acute infiltrate. Electronically Signed   By: Lavonia Dana M.D.   On: 04/01/2017 13:09          Procedures Procedures (including critical care time)  CRITICAL CARE Performed by: Gwenyth Allegra Tegeler Total critical care time: 35 minutes Critical care time was exclusive of separately billable procedures and treating other patients. Sepsis with hypotension.  Critical care was necessary to treat or prevent imminent or life-threatening deterioration. Critical care was time spent personally by me on the following activities: development of treatment plan with patient and/or surrogate as well as nursing, discussions with consultants, evaluation of patient's response to  treatment, examination of patient, obtaining history from patient or surrogate, ordering and performing treatments and interventions, ordering and review of laboratory studies, ordering and review of radiographic studies, pulse oximetry and re-evaluation of patient's condition.   Medications Ordered in ED Medications  sodium chloride 0.9 % bolus 1,000 mL (0 mLs Intravenous Stopped 04/01/17 1634)    And  sodium chloride 0.9 % bolus 500 mL (500 mLs Intravenous New Bag/Given 04/01/17 1412)  aztreonam (AZACTAM) 2 g in dextrose 5 % 50 mL IVPB (0 g Intravenous Stopped 04/01/17 1344)  metroNIDAZOLE (FLAGYL) IVPB 500 mg (0 mg Intravenous Stopped 04/01/17 1445)  vancomycin (VANCOCIN) IVPB 1000 mg/200 mL premix (1,000 mg Intravenous New Bag/Given 04/01/17 1503)     Initial Impression / Assessment  and Plan / ED Course  I have reviewed the triage vital signs and the nursing notes.  Pertinent labs & imaging results that were available during my care of the patient were reviewed by me and considered in my medical decision making (see chart for details).     Angel Estrada is a 83 y.o. female with a past medical history significant for diabetes, CHF, hypertension, COPD, and left leg wound status post admission and antibiotics who presents with worsening left leg wound.  Patient says that she was admitted at [facility] and discharged 5 days ago for admission for left leg cellulitis.  At that time, patient was placed on antibiotics and had improvement in symptoms however, she was discharged on doxycycline.  She reports that she completed her antibiotic course and has had worsening foul smell, pain, redness, skin turning black, and crawling insects inside the wound.  Patient denies significant fevers or chills but does report some fatigue.  She denies nausea, vomiting, urinary symptoms, or GI symptoms.  She describes the pain as moderate.  She denies other complaints.  On exam, patient has a wound on her left  shin.  There is erythema and black tissue that appears to be necrotic.  On close inspection, there are numerous large of a of some form of insect crawling throughout the wound.  There is purulence and a foul smell emanating from the wound.  There are gnats flying around, this may be the insects seen in the wound.  Patient has edema of the left leg and pain going down the left leg.  Patient does have a palpable DP pulse through her edema.  Patient can wiggle her toes and has normal sensation distally.  Patient's lungs were clear and abdomen was nontender.  Patient had intermittent hypoxia and was placed on 1 L nasal cannula by EMS.  On my initial evaluation, patient's blood pressure was in the 80s.  With the concern for source of infection and hypotension, patient was made a code sepsis.  Patient given fluids.  Lactic acid returned at 3.92 and leukocytosis of 13.7 was discovered.  Hemoglobin also elevated which may suggest dehydration.  BNP was ordered with the edema and her CHF history.  X-rays were obtained to look for evidence of osteomyelitis or subcutaneous gas both of which were negative on the imaging.  Patient received IV antibiotics and fluids and cultures.  Hospitalist  team will be called for admission for sepsis with cellulitis.    Final Clinical Impressions(s) / ED Diagnoses   Final diagnoses:  Sepsis due to cellulitis [facility])     Clinical Impression: 1. Sepsis due to cellulitis [facility])     Disposition: Admit to hospitalist service    Tegeler, Gwenyth Allegra, MD 04/01/17 2112

## 2017-04-01 NOTE — ED Notes (Signed)
RN attempt x2 for istat repeat

## 2017-04-01 NOTE — ED Notes (Signed)
Pt aware urine sample needed 

## 2017-04-01 NOTE — Progress Notes (Signed)
CSW asked to speak with Pt about living situation. CSW went to speak with Pt, Pt receiving medical treatment. CSW will return to speak with pt at a later time.   Wendelyn Breslow, [facility]  [phone]

## 2017-04-02 ENCOUNTER — Encounter (HOSPITAL_COMMUNITY): Payer: Medicare Other

## 2017-04-02 DIAGNOSIS — L0390 Cellulitis, unspecified: Secondary | ICD-10-CM

## 2017-04-02 DIAGNOSIS — I10 Essential (primary) hypertension: Secondary | ICD-10-CM

## 2017-04-02 LAB — BASIC METABOLIC PANEL
Anion gap: 7 (ref 5–15)
BUN: 41 mg/dL — AB (ref 6–20)
CALCIUM: 8.2 mg/dL — AB (ref 8.9–10.3)
CO2: 34 mmol/L — ABNORMAL HIGH (ref 22–32)
CREATININE: 0.73 mg/dL (ref 0.44–1.00)
Chloride: 101 mmol/L (ref 101–111)
GFR calc Af Amer: >60 mL/min (ref >60)
GLUCOSE: 86 mg/dL (ref 65–99)
Potassium: 3.5 mmol/L (ref 3.5–5.1)
Sodium: 142 mmol/L (ref 135–145)

## 2017-04-02 LAB — CBC
HCT: 44.5 % (ref 36.0–46.0)
Hemoglobin: 13.9 g/dL (ref 12.0–15.0)
MCH: 30.8 pg (ref 26.0–34.0)
MCHC: 31.2 g/dL (ref 30.0–36.0)
MCV: 98.7 fL (ref 78.0–100.0)
Platelets: 344 10*3/uL (ref 150–400)
RBC: 4.51 MIL/uL (ref 3.87–5.11)
RDW: 16.7 % — AB (ref 11.5–15.5)
WBC: 11.9 10*3/uL — ABNORMAL HIGH (ref 4.0–10.5)

## 2017-04-02 LAB — GLUCOSE, CAPILLARY
GLUCOSE-CAPILLARY: 74 mg/dL (ref 65–99)
GLUCOSE-CAPILLARY: 74 mg/dL (ref 65–99)
GLUCOSE-CAPILLARY: 54 mg/dL — AB (ref 65–99)
Glucose-Capillary: 92 mg/dL (ref 65–99)
Glucose-Capillary: 110 mg/dL — ABNORMAL HIGH (ref 65–99)

## 2017-04-02 LAB — LACTIC ACID, PLASMA: Lactic Acid, Venous: 1.3 mmol/L (ref 0.5–1.9)

## 2017-04-02 MED ORDER — METRONIDAZOLE IN NACL 5-0.79 MG/ML-% IV SOLN
500.0000 mg | Freq: Three times a day (TID) | INTRAVENOUS | Status: DC
  Administered 2017-04-02 – 2017-04-03 (×4): 500 mg via INTRAVENOUS
  Filled 2017-04-02 (×4): qty 100

## 2017-04-02 MED ORDER — ALBUTEROL SULFATE (2.5 MG/3ML) 0.083% IN NEBU: 2.5000 mg | INHALATION_SOLUTION | Freq: Four times a day (QID) | RESPIRATORY_TRACT | Status: DC | PRN

## 2017-04-02 MED ORDER — ENOXAPARIN SODIUM 30 MG/0.3ML ~~LOC~~ SOLN
30.0000 mg | SUBCUTANEOUS | Status: DC
  Administered 2017-04-02 – 2017-04-06 (×5): 30 mg via SUBCUTANEOUS
  Filled 2017-04-02 (×5): qty 0.3

## 2017-04-02 NOTE — Progress Notes (Signed)
Pt got to this floor at 0015, alert and oriented x4, vital signs at the baseline, BP 100/60,CBG 74 at 0100, NPO midnight, skin dry,complete CHG  bath given,stage 1 pressure ulcer at sacral area, wound care done on the left leg, +2 pitting edema on left leg, attending doctor paged via amion  for admitting orders, will continue to monitor.

## 2017-04-02 NOTE — Progress Notes (Signed)
PROGRESS NOTE    Angel Estrada  IRC:[id number] DOB: Sep 17, 1933 DOA: 04/01/2017  PCP: Jani Gravel, MD   Brief Narrative:  Angel Estrada is a 83 y.o. female with medical history significant of DM, pHTN, depression, and recent LE wound and other comrobids.  She was discharged on 11/23 with the diagnosis of left leg hematoma and surrounding infection.  She was treated with IV vanc and then transitioned to doxycycline which she finished. Initially she was improved but then began to worsen.   When her wound covering was removed, the wound was covered in cat hair and flying knats.   Patient reports she has seen insects crawling inside her wound.     In the ER, her leg had erythema with areas of black necrotic tissue with shiny moving areas that appear river-like.  When patient arrived into the ER, she was hypotensive and code sepsis was called. Lactic acid was elevated so IVF was given with improvement in her blood pressure. X rays were obtained to look for osteomyelitis and subcutaneous gas both of which were negative.  Patient was given IV Abx and hospitalist were called to admit. Plastic Surgery was called to evaluate the wound for possible debridement.  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 Debridement; Discussed with Orthopedic Surgery Dr. Victorino December who recommended obtaining Plastic's Consultation -I Spoke with Dr. Marla Roe of Plastic Surgery who will see the patient in consultation  for possible Debridement  -Tib/Fib X-Ray showed Soft tissue changes without acute osseous abnormalities. -Will flush well and cover for now -Continue IV abx- Vanc, Aztreonam, and Metronidazole for now -ABI pending  -Duplex LE to r/o clot pending  -Trend lactic acid and Trending down -Procalcitonin ordered and pending -WBC went from 13.7 -> 11.9  -Blood Cx x2 Showed NGTD at 1 day  -Repeat CBC in AM   Lactic Acidosis -Likely from Above -LA Level went from 3.92 -> 2.3 -> 1.3  Chronic Diastolic CHF -Stable and appears Euvolemic -BNP was 91.6  -limit IVF to avoid overload -Patient is +2.180 Liters   Pulmonary Hypertension  -Resume home meds of Sildenafil 20 mg po TID  DM -C/w Sensitive Novolog SSI AC/HS -CBG's range from 54-92  HTN -Hold while BP on lower side  COPD -Currently not exacerbation -C/w Albuterol 2.5 mg IH q6hprn wheezing/SOB and Symbicort Substitution with Dulera 2 puff IH BID  DVT prophylaxis: Enoxaparin 30 mg sq q24h Code Status: FULL CODE Family Communication: No family present at bedside Disposition Plan: Remain Inpatient for further Workup and Evaluation  Consultants:   [place] Nurse  Discussed Case with Orthopedic Surgery Dr. Stann Mainland  Plastic Surgery Consultation Dr. Lyndee Leo Dillingham  Procedures:  None   Antimicrobials:  Anti-infectives (From admission, onward)   Start     Dose/Rate Route Frequency Ordered Stop   04/02/17 1600  vancomycin (VANCOCIN) IVPB 750 mg/150 ml premix     750 mg 150 mL/hr over 60 Minutes Intravenous Every 24 hours 04/01/17 1819  04/02/17 0945  metroNIDAZOLE (FLAGYL) IVPB 500 mg     500 mg 100 mL/hr over 60 Minutes Intravenous Every 8 hours 04/02/17 0935     04/01/17 2200  aztreonam (AZACTAM) 1 g in dextrose 5 % 50 mL IVPB     1 g 100 mL/hr over 30 Minutes Intravenous Every 8 hours 04/01/17 1819     04/01/17 1215  aztreonam (AZACTAM) 2 g in dextrose 5 % 50 mL IVPB     2 g 100 mL/hr over 30 Minutes Intravenous   Once 04/01/17 1208 04/01/17 1344   04/01/17 1215  metroNIDAZOLE (FLAGYL) IVPB 500 mg     500 mg 100 mL/hr over 60 Minutes Intravenous  Once 04/01/17 1208 04/01/17 1445   04/01/17 1215  vancomycin (VANCOCIN) IVPB 1000 mg/200 mL premix     1,000 mg 200 mL/hr over 60 Minutes Intravenous  Once 04/01/17 1208 04/01/17 1603     Subjective: Seen and examined and states she was hungry. No CP or SOB. States leg hurts a little bit and doesn't know how it got this bad.   Objective: Vitals:   04/01/17 2315 04/02/17 0102 04/02/17 0632 04/02/17 1500  BP:  1_0  Pulse: 69 71 78 98  Resp: _1 Temp:  97.6 F (36.4 C) 98.2 F (36.8 C) 99.4 F (37.4 C)  TempSrc:  Oral Oral Oral  SpO2: 100% 98% 100% 94%  Weight:      Height:        Intake/Output Summary (Last 24 hours) at 04/02/2017 1740 Last data filed at 04/02/2017 1500 Gross per 24 hour  Intake 1030 ml  Output 200 ml  Net 830 ml   Filed Weights   04/01/17 1128  Weight: 43.1 kg (95 lb)   Examination: Physical Exam:  Constitutional: Thin Caucasian female in NAD and appears calm and comfortable Eyes: Lids and conjunctivae normal, sclerae anicteric  ENMT: External Ears, Nose appear normal. Grossly normal hearing. Mucous membranes are moist.  Neck: Appears normal, supple, no cervical masses, normal ROM, no appreciable thyromegaly, no JVD Respiratory: Clear to auscultation bilaterally, no wheezing, rales, rhonchi or crackles. Cardiovascular: RRR, no murmurs / rubs / gallops. S1 and S2 auscultated. 1+ LE extremity edema  Abdomen: Soft, non-tender, non-distended. No masses palpated. No appreciable hepatosplenomegaly. Bowel sounds present Musculoskeletal: No clubbing / cyanosis of digits/nails. No joint deformity upper and lower extremities.  Skin: Has a large necrotic ulcer with an eschar and multiple insects larvae crawling in wound with old blood on left anterior shin. No induration; Warm and dry.  Neurologic: CN  2-12 grossly intact with no focal deficits. Romberg sign cerebellar reflexes not assessed.  Psychiatric: Normal judgment and insight. Alert and oriented x 3. Anxious mood and appropriate affect.   Data Reviewed: I have personally reviewed following labs and imaging studies  CBC: Recent Labs  Lab 03/27/17 0513 04/01/17 1242 04/02/17 0258  WBC 10.1 13.7* 11.9*  NEUTROABS  --  12.1*  --   HGB 13.3 17.1* 13.9  HCT 42.8 52.6* 44.5  MCV 99.5 99.2 98.7  PLT 373 314 935   Basic Metabolic Panel: Recent Labs  Lab 03/27/17 0513 04/01/17 1245 04/02/17 0258  NA 143 142 142  K 3.7 3.8 3.5  CL 98* 95* 101  CO2 37* 34* 34*  GLUCOSE 92 118* 86  BUN 46* 58* 41*  CREATININE 1.03* 0.91 0.73  CALCIUM 8.5* 9.2 8.2*   GFR: Estimated Creatinine Clearance: 37.5 mL/min (by C-G formula based  on SCr of 0.73 mg/dL). Liver Function Tests: No results for input(s): AST, ALT, ALKPHOS, BILITOT, PROT, ALBUMIN in the last 168 hours. No results for input(s): LIPASE, AMYLASE in the last 168 hours. No results for input(s): AMMONIA in the last 168 hours. Coagulation Profile: No results for input(s): INR, PROTIME in the last 168 hours. Cardiac Enzymes: No results for input(s): CKTOTAL, CKMB, CKMBINDEX, TROPONINI in the last 168 hours. BNP (last 3 results) No results for input(s): PROBNP in the last 8760 hours. HbA1C: No results for input(s): HGBA1C in the last 72 hours. CBG: Recent Labs  Lab 04/01/17 2203 04/02/17 0100 04/02/17 0716 04/02/17 1227 04/02/17 1318  GLUCAP 162* 74 74 54* 92   Lipid Profile: No results for input(s): CHOL, HDL, LDLCALC, TRIG, CHOLHDL, LDLDIRECT in the last 72 hours. Thyroid Function Tests: No results for input(s): TSH, T4TOTAL, FREET4, T3FREE, THYROIDAB in the last 72 hours. Anemia Panel: No results for input(s): VITAMINB12, FOLATE, FERRITIN, TIBC, IRON, RETICCTPCT in the last 72 hours. Sepsis Labs: Recent Labs  Lab 04/01/17 1250 04/01/17 1952 04/02/17 0258    LATICACIDVEN 3.92* 2.3* 1.3    Recent Results (from the past 240 hour(s))  Culture, blood (routine x 2)     Status: None   Collection Time: 03/25/17  3:38 PM  Result Value Ref Range Status   Specimen Description BLOOD RIGHT ANTECUBITAL  Final   Special Requests IN PEDIATRIC BOTTLE Blood Culture adequate volume  Final   Culture   Final    NO GROWTH 5 DAYS Performed at [facility], [place] [address], [place], [place] [address]    Report Status 03/30/2017 FINAL  Final  Culture, blood (routine x 2)     Status: None   Collection Time: 03/25/17  4:34 PM  Result Value Ref Range Status   Specimen Description BLOOD BLOOD RIGHT HAND  Final   Special Requests IN PEDIATRIC BOTTLE Blood Culture adequate volume  Final   Culture   Final    NO GROWTH 5 DAYS Performed at [facility], [place] [address], [place], [place] [address]    Report Status 03/30/2017 FINAL  Final  Blood Culture (routine x 2)     Status: None (Preliminary result)   Collection Time: 04/01/17 12:39 PM  Result Value Ref Range Status   Specimen Description BLOOD RIGHT ANTECUBITAL  Final   Special Requests IN PEDIATRIC BOTTLE Blood Culture adequate volume  Final   Culture NO GROWTH 1 DAY  Final   Report Status PENDING  Incomplete  Blood Culture (routine x 2)     Status: None (Preliminary result)   Collection Time: 04/01/17 12:45 PM  Result Value Ref Range Status   Specimen Description BLOOD LEFT ANTECUBITAL  Final   Special Requests IN PEDIATRIC BOTTLE Blood Culture adequate volume  Final   Culture NO GROWTH 1 DAY  Final   Report Status PENDING  Incomplete    Radiology Studies: Dg Tibia/fibula Left  Result Date: 04/01/2017 CLINICAL DATA:  Large open wound at mid lower leg possible infection, history diabetes mellitus, hypertension, CHF EXAM: LEFT TIBIA AND FIBULA - 2 VIEW COMPARISON:  03/25/2017 FINDINGS: Osseous demineralization. Hip and knee joint spaces preserved. No acute fracture, dislocation or bone  destruction. Scattered soft tissue swelling LEFT lower leg with large area of soft tissue irregularity and ulceration at the anterior aspect of the mid lower leg. No definite associated radiopaque foreign bodies or underlying osseous changes. IMPRESSION: Soft tissue changes without acute osseous abnormalities. Electronically Signed  By: Lavonia Dana M.D.   On: 04/01/2017 13:07   Dg Chest Portable 1 View  Result Date: 04/01/2017 CLINICAL DATA:  Large open wound at mid LEFT lower leg EXAM: PORTABLE CHEST 1 VIEW COMPARISON:  Portable exam 1242 hours compared to 07/30/2015 FINDINGS: Stable heart size. Large LEFT diaphragmatic versus hiatal hernia again identified. Mediastinal contours and pulmonary vascularity otherwise normal. Chronic lung changes at the bases including LEFT basilar atelectasis and scarring at minor fissure. No definite infiltrate, pleural effusion or pneumothorax. Bones demineralized. Scattered atherosclerotic calcifications aorta. IMPRESSION: Large LEFT diaphragmatic versus hiatal hernia. Bronchitic changes with chronic LEFT basilar atelectasis and scarring at minor fissure. No acute infiltrate. Electronically Signed   By: Lavonia Dana M.D.   On: 04/01/2017 13:09   Scheduled Meds: . allopurinol  200 mg Oral Daily  . enoxaparin (LOVENOX) injection  30 mg Subcutaneous Q24H  . insulin aspart  0-5 Units Subcutaneous QHS  . insulin aspart  0-9 Units Subcutaneous TID WC  . mometasone-formoterol  2 puff Inhalation BID  . polyvinyl alcohol  1 drop Both Eyes BID  . sildenafil  20 mg Oral TID   Continuous Infusions: . aztreonam Stopped (04/02/17 1338)  . metronidazole Stopped (04/02/17 1245)  . vancomycin 750 mg (04/02/17 1515)    LOS: 1 day   Kerney Elbe, DO [facility] Pager [phone]  If 7PM-7AM, please contact night-coverage [email or web address] Password [email or web address] 04/02/2017, 5:40 PM

## 2017-04-02 NOTE — Progress Notes (Signed)
Patients blood sugar is 54, gave patient 2 orange juices and peanut butter with crackers. Will continue to monitor

## 2017-04-02 NOTE — Progress Notes (Signed)
Pharmacy Antibiotic Note  Angel Estrada is a 83 y.o. female admitted on 04/01/2017 with cellulitis.  Pharmacy has been consulted for aztreonam/vancomycin/Flagyl dosing. WBC down to 11.9, Afebrile. LA trend down. SCr stable WNL.  Flagyl not re-ordered 11/28 after 1st dose - will place order now.  Plan: -Aztreonam 1 gm IV Q 8 hours -Vancomycin 750 mg IV Q 24 hours -Flagyl 549m IV q8h -Monitor clinical progress, c/s, renal function -F/u de-escalation plan/LOT, vancomycin trough as indicated  Height: 5' (152.4 cm) Weight: 95 lb (43.1 kg) IBW/kg (Calculated) : 45.5  Temp (24hrs), Avg:97.7 F (36.5 C), Min:97.4 F (36.3 C), Max:98.2 F (36.8 C)  Recent Labs  Lab 03/27/17 0513 04/01/17 1242 04/01/17 1245 04/01/17 1250 04/01/17 1952 04/02/17 0258  WBC 10.1 13.7*  --   --   --  11.9*  CREATININE 1.03*  --  0.91  --   --  0.73  LATICACIDVEN  --   --   --  3.92* 2.3* 1.3    Estimated Creatinine Clearance: 37.5 mL/min (by C-G formula based on SCr of 0.73 mg/dL).    Allergies  Allergen Reactions  . Penicillins Swelling and Rash    Site of swelling (?) Has patient had a PCN reaction causing immediate rash, facial/tongue/throat swelling, SOB or lightheadedness with hypotension: Yes Has patient had a PCN reaction causing severe rash involving mucus membranes or skin necrosis: No Has patient had a PCN reaction that required hospitalization: No Has patient had a PCN reaction occurring within the last 10 years: No If all of the above answers are "NO", then may proceed with Cephalosporin use.   .Marland KitchenLisinopril-Hydrochlorothiazide Other (See Comments) and Cough    Pt's PCP took her off medication because of possible kidney damage  . Sulfa Antibiotics Other (See Comments)    Unknown reaction-- childhood allergy   . Latex Rash    Antimicrobials this admission: Vanc 11/28 >>  Aztreonam 11/28 >>  Flagyl 1/28 >>  Dose adjustments this admission: None   Microbiology results: 11/28  BCx:   HElicia Lamp PharmD, BCPS Clinical Pharmacist Rx Phone # for today: #[phone] 3:30PM, please call Main Rx: #[phone]/29/2018 9:39 AM

## 2017-04-02 NOTE — Consult Note (Addendum)
[place] Nurse wound consult note Reason for Consult: Consult requested for left leg.  Pt states she developed a hematoma last week and she has "lots of cats in her house."  Left calf has a full thickness wound with crawling larva and is covered by matted crusted cat hair. Assessed wound with primary team at the bedside; they plan to consult ortho service to determine if further debridement will be required.  Expect wound will eventually evolve into a fairly deep full thickness wound; pt could benefit from follow-up after discharge at the outpatient wound care center.  Please refer if desired. Wound type: Cleansed with hydrogen peroxide to remove outer debris,  Used forceps to pull off outer dry scabbed areas and crusted cat hair.  Multiple insects are crawling in the inner wound bed, which is 80% loose congealed old blood, 10% dark reddish purple, 10% red; removed mod amt clotted blood. Small amt pink drainage, no odor.  Pt tolerated without c/o pain.   Measurement: 10X5X.8cm Periwound: Few patchy areas of pink moist partial thickness wounds surrounding the larger wound. Dressing procedure/placement/frequency: Moist gauze dressing applied until further input is available from the ortho service. Please re-consult if further assistance is needed.  Thank-you,  Julien Girt MSN, [place], [place], [place], [place]

## 2017-04-02 NOTE — Progress Notes (Signed)
Wound care provided on LLE. Cleansed wound with NS, moistened gauze, ABD pads, and kerlex. patient tolerated well.   

## 2017-04-02 NOTE — Progress Notes (Signed)
Blood sugar rechecked, 94, patient has no complaints at this time. Will continue to monitor

## 2017-04-03 ENCOUNTER — Encounter (HOSPITAL_COMMUNITY): Payer: Self-pay | Admitting: Physician Assistant

## 2017-04-03 DIAGNOSIS — T148XXA Other injury of unspecified body region, initial encounter: Secondary | ICD-10-CM

## 2017-04-03 DIAGNOSIS — L089 Local infection of the skin and subcutaneous tissue, unspecified: Secondary | ICD-10-CM

## 2017-04-03 LAB — COMPREHENSIVE METABOLIC PANEL
ALBUMIN: 2 g/dL — AB (ref 3.5–5.0)
ALT: 18 U/L (ref 14–54)
AST: 28 U/L (ref 15–41)
Alkaline Phosphatase: 177 U/L — ABNORMAL HIGH (ref 38–126)
Anion gap: 8 (ref 5–15)
BILIRUBIN TOTAL: 1 mg/dL (ref 0.3–1.2)
BUN: 31 mg/dL — AB (ref 6–20)
CO2: 33 mmol/L — ABNORMAL HIGH (ref 22–32)
CREATININE: 0.87 mg/dL (ref 0.44–1.00)
Calcium: 8.3 mg/dL — ABNORMAL LOW (ref 8.9–10.3)
Chloride: 97 mmol/L — ABNORMAL LOW (ref 101–111)
GFR calc Af Amer: >60 mL/min (ref >60)
GFR calc non Af Amer: >60 mL/min (ref >60)
GLUCOSE: 81 mg/dL (ref 65–99)
POTASSIUM: 3.6 mmol/L (ref 3.5–5.1)
Sodium: 138 mmol/L (ref 135–145)
TOTAL PROTEIN: 5.4 g/dL — AB (ref 6.5–8.1)

## 2017-04-03 LAB — CBC WITH DIFFERENTIAL/PLATELET
BASOS ABS: 0 10*3/uL (ref 0.0–0.1)
Basophils Relative: 0 %
EOS PCT: 0 %
Eosinophils Absolute: 0 10*3/uL (ref 0.0–0.7)
HEMATOCRIT: 43.6 % (ref 36.0–46.0)
HEMOGLOBIN: 13.9 g/dL (ref 12.0–15.0)
LYMPHS PCT: 7 %
Lymphs Abs: 1.3 10*3/uL (ref 0.7–4.0)
MCH: 31.4 pg (ref 26.0–34.0)
MCHC: 31.9 g/dL (ref 30.0–36.0)
MCV: 98.6 fL (ref 78.0–100.0)
MONOS PCT: 5 %
Monocytes Absolute: 0.9 10*3/uL (ref 0.1–1.0)
NEUTROS PCT: 88 %
Neutro Abs: 16.4 10*3/uL — ABNORMAL HIGH (ref 1.7–7.7)
Platelets: 332 10*3/uL (ref 150–400)
RBC: 4.42 MIL/uL (ref 3.87–5.11)
RDW: 16.5 % — AB (ref 11.5–15.5)
WBC: 18.6 10*3/uL — AB (ref 4.0–10.5)

## 2017-04-03 LAB — GLUCOSE, CAPILLARY
GLUCOSE-CAPILLARY: 119 mg/dL — AB (ref 65–99)
Glucose-Capillary: 108 mg/dL — ABNORMAL HIGH (ref 65–99)
Glucose-Capillary: 115 mg/dL — ABNORMAL HIGH (ref 65–99)

## 2017-04-03 LAB — MAGNESIUM: Magnesium: 1.8 mg/dL (ref 1.7–2.4)

## 2017-04-03 LAB — PHOSPHORUS: Phosphorus: 2.6 mg/dL (ref 2.5–4.6)

## 2017-04-03 MED ORDER — COLLAGENASE 250 UNIT/GM EX OINT
TOPICAL_OINTMENT | Freq: Every day | CUTANEOUS | Status: DC
  Administered 2017-04-04 – 2017-04-07 (×4): via TOPICAL
  Filled 2017-04-03: qty 30

## 2017-04-03 MED ORDER — METRONIDAZOLE 500 MG PO TABS
500.0000 mg | ORAL_TABLET | Freq: Three times a day (TID) | ORAL | Status: DC
  Administered 2017-04-03 – 2017-04-11 (×24): 500 mg via ORAL
  Filled 2017-04-03 (×24): qty 1

## 2017-04-03 NOTE — Progress Notes (Signed)
Wound care provided on LLE. Cleansed wound with NS, moistened gauze, ABD pads, and kerlex. patient tolerated well.

## 2017-04-03 NOTE — Progress Notes (Signed)
PROGRESS NOTE    Angel Estrada  WOE:[id number] DOB: Dec 14, 1933 DOA: 04/01/2017  PCP: Jani Gravel, MD   Brief Narrative:  Angel Estrada is a 83 y.o. female with medical history significant of DM, pHTN, depression, and recent LE wound and other comrobids.  She was discharged on 11/23 with the diagnosis of left leg hematoma and surrounding infection.  She was treated with IV vanc and then transitioned to doxycycline which she finished. Initially she was improved but then began to worsen.   When her wound covering was removed, the wound was covered in cat hair and flying knats.   Patient reports she has seen insects crawling inside her wound.     In the ER, her leg had erythema with areas of black necrotic tissue with shiny moving areas that appear river-like.  When patient arrived into the ER, she was hypotensive and code sepsis was called. Lactic acid was elevated so IVF was given with improvement in her blood pressure. X rays were obtained to look for osteomyelitis and subcutaneous gas both of which were negative.  Patient was given IV Abx and hospitalist were called to admit. Plastic Surgery was called to evaluate the wound for possible debridement and patient initially declined surgical debridement but wants to speak with Plastic Surgery again in terms of what it would entail. Plastic Surgery recommending Hydrotherapy and Santyl for now as there is no Plurogel.    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 -[place] nurse Consulted   -I Spoke with Dr. Marla Roe of Plastic Surgery who will see the patient in consultation for possible Debridement; Plastic Surgery evaluated discussed possibility of going to the OR for I and D of the wound and possible placement of Acell and VAC dressing by Dr. Marla Roe -Patient Declined Surgical Option for Debridement however changed her mind and wanted to speak with Plastic Surgery again -Plastic Surgery recommending trying Hydrotherapy and Santyl to Necrotic Tissue -Plastic Surgery also Recommended Plurogel but Angel Estrada does not carry it on Formulary  -Continue IV abx- Vanc, Aztreonam, and Metronidazole for now -ABI pending  -Duplex LE to r/o clot pending  -Trend lactic acid and Trended down -WBC went from 13.7 -> 11.9 -> 18.6 -Blood Cx x2 Showed NGTD at 2 day  -PT/OT Evaluate and Treat  -Nutritionist Consulted  -Repeat CBC in AM   Lactic Acidosis -Likely from Above -LA Level went from 3.92 -> 2.3 -> 1.3  Chronic Diastolic CHF -Stable and appears Euvolemic -BNP was 91.6  -limit IVF to avoid overload -Patient is +2.460 Liters   Pulmonary Hypertension  -Resume home meds of Sildenafil 20 mg po TID  DM -C/w Sensitive Novolog SSI AC/HS -CBG's range from 108-119  HTN -Hold Home Medications while BP on lower side  COPD -Currently not exacerbation -C/w Albuterol 2.5 mg IH q6hprn wheezing/SOB and Symbicort Substitution with Dulera 2 puff IH BID  DVT prophylaxis: Enoxaparin 30 mg sq q24h Code Status: FULL CODE Family Communication: No family present  at bedside Disposition Plan: Remain Inpatient for further Workup and Evaluation  Consultants:   [facility] Nurse  Discussed Case with Orthopedic Surgery Dr. Stann Mainland  Plastic Surgery Consultation Dr. Audelia Hives  Procedures:  None   Antimicrobials:  Anti-infectives (From admission, onward)   Start     Dose/Rate Route Frequency Ordered Stop     04/03/17 1400  metroNIDAZOLE (FLAGYL) tablet 500 mg     500 mg Oral Every 8 hours 04/03/17 0955     04/02/17 1600  vancomycin (VANCOCIN) IVPB 750 mg/150 ml premix     750 mg 150 mL/hr over 60 Minutes Intravenous Every 24 hours 04/01/17 1819     04/02/17 0945  metroNIDAZOLE (FLAGYL) IVPB 500 mg  Status:  Discontinued     500 mg 100 mL/hr over 60 Minutes Intravenous Every 8 hours 04/02/17 0935 04/03/17 0955   04/01/17 2200  aztreonam (AZACTAM) 1 g in dextrose 5 % 50 mL IVPB     1 g 100 mL/hr over 30 Minutes Intravenous Every 8 hours 04/01/17 1819     04/01/17 1215  aztreonam (AZACTAM) 2 g in dextrose 5 % 50 mL IVPB     2 g 100 mL/hr over 30 Minutes Intravenous  Once 04/01/17 1208 04/01/17 1344   04/01/17 1215  metroNIDAZOLE (FLAGYL) IVPB 500 mg     500 mg 100 mL/hr over 60 Minutes Intravenous  Once 04/01/17 1208 04/01/17 1445   04/01/17 1215  vancomycin (VANCOCIN) IVPB 1000 mg/200 mL premix     1,000 mg 200 mL/hr over 60 Minutes Intravenous  Once 04/01/17 1208 04/01/17 1603     Subjective: Seen and examined and states she slept ok. States leg hurts whenever it is picked up. No CP or SOB. Initially declined surgical debridement but wants to speak with Plastic Surgery again.    Objective: Vitals:   04/02/17 1500 04/02/17 2016 04/03/17 0710 04/03/17 1300  BP: 98/63 112/69 92/60 96/61  Pulse: 98 94 78 82  Resp: _0 Temp: 99.4 F (37.4 C) 98.1 F (36.7 C) (!) 97.5 F (36.4 C) 98.9 F (37.2 C)  TempSrc: Oral Oral Oral Oral  SpO2: 94% 95% 99% 97%  Weight:      Height:        Intake/Output Summary (Last 24 hours) at 04/03/2017 1537 Last data filed at 04/03/2017 1300 Gross per 24 hour  Intake 1530 ml  Output 1250 ml  Net 280 ml   Filed Weights   04/01/17 1128  Weight: 43.1 kg (95 lb)   Examination: Physical Exam:  Constitutional: Thin Cachetic appearing elderly Caucasian female in NAD Eyes: Sclerae anicteric. Lids normal ENMT: External Ears and nose appear  normal Neck: Appears normal. Supple Respiratory: Diminished but clear to Ausculation. Unlabored breathing Cardiovascular: RRR; 2/6 Systolic Murmur. 1+ LE edema  Abdomen: Soft, NT, ND. Bowel Sounds present Musculoskeletal: No contractures; No cyanosis Skin: Has a large necrotic ulcer on the Left anterior shin with congealed blood; No appreciable maggots/bugs today. Has Lower Extremity edema on the left leg and foot. Has some brusing on right Neurologic: CN 2-12 grossly intact. No appreciable focal deficits Psychiatric: Normal mood and affect. Intact judgement and insight.   Data Reviewed: I have personally reviewed following labs and imaging studies  CBC: Recent Labs  Lab 04/01/17 1242 04/02/17 0258 04/03/17 0420  WBC 13.7* 11.9* 18.6*  NEUTROABS 12.1*  --  16.4*  HGB 17.1* 13.9 13.9  HCT 52.6* 44.5 43.6  MCV 99.2 98.7 98.6  PLT 314  344 476   Basic Metabolic Panel: Recent Labs  Lab 04/01/17 1245 04/02/17 0258 04/03/17 0420  NA 142 142 138  K 3.8 3.5 3.6  CL 95* 101 97*  CO2 34* 34* 33*  GLUCOSE 118* 86 81  BUN 58* 41* 31*  CREATININE 0.91 0.73 0.87  CALCIUM 9.2 8.2* 8.3*  MG  --   --  1.8  PHOS  --   --  2.6   GFR: Estimated Creatinine Clearance: 34.5 mL/min (by C-G formula based on SCr of 0.87 mg/dL). Liver Function Tests: Recent Labs  Lab 04/03/17 0420  AST 28  ALT 18  ALKPHOS 177*  BILITOT 1.0  PROT 5.4*  ALBUMIN 2.0*   No results for input(s): LIPASE, AMYLASE in the last 168 hours. No results for input(s): AMMONIA in the last 168 hours. Coagulation Profile: No results for input(s): INR, PROTIME in the last 168 hours. Cardiac Enzymes: No results for input(s): CKTOTAL, CKMB, CKMBINDEX, TROPONINI in the last 168 hours. BNP (last 3 results) No results for input(s): PROBNP in the last 8760 hours. HbA1C: No results for input(s): HGBA1C in the last 72 hours. CBG: Recent Labs  Lab 04/02/17 1227 04/02/17 1318 04/02/17 2017 04/03/17 0654 04/03/17 1202    GLUCAP 54* 92 110* 108* 119*   Lipid Profile: No results for input(s): CHOL, HDL, LDLCALC, TRIG, CHOLHDL, LDLDIRECT in the last 72 hours. Thyroid Function Tests: No results for input(s): TSH, T4TOTAL, FREET4, T3FREE, THYROIDAB in the last 72 hours. Anemia Panel: No results for input(s): VITAMINB12, FOLATE, FERRITIN, TIBC, IRON, RETICCTPCT in the last 72 hours. Sepsis Labs: Recent Labs  Lab 04/01/17 1250 04/01/17 1952 04/02/17 0258  LATICACIDVEN 3.92* 2.3* 1.3    Recent Results (from the past 240 hour(s))  Culture, blood (routine x 2)     Status: None   Collection Time: 03/25/17  3:38 PM  Result Value Ref Range Status   Specimen Description BLOOD RIGHT ANTECUBITAL  Final   Special Requests IN PEDIATRIC BOTTLE Blood Culture adequate volume  Final   Culture   Final    NO GROWTH 5 DAYS Performed at [facility], [place] [address], [place], [place] [address]    Report Status 03/30/2017 FINAL  Final  Culture, blood (routine x 2)     Status: None   Collection Time: 03/25/17  4:34 PM  Result Value Ref Range Status   Specimen Description BLOOD BLOOD RIGHT HAND  Final   Special Requests IN PEDIATRIC BOTTLE Blood Culture adequate volume  Final   Culture   Final    NO GROWTH 5 DAYS Performed at [facility], [place] [address], [place], [place] [address]    Report Status 03/30/2017 FINAL  Final  Blood Culture (routine x 2)     Status: None (Preliminary result)   Collection Time: 04/01/17 12:39 PM  Result Value Ref Range Status   Specimen Description BLOOD RIGHT ANTECUBITAL  Final   Special Requests IN PEDIATRIC BOTTLE Blood Culture adequate volume  Final   Culture NO GROWTH 2 DAYS  Final   Report Status PENDING  Incomplete  Blood Culture (routine x 2)     Status: None (Preliminary result)   Collection Time: 04/01/17 12:45 PM  Result Value Ref Range Status   Specimen Description BLOOD LEFT ANTECUBITAL  Final   Special Requests IN PEDIATRIC BOTTLE Blood Culture adequate  volume  Final   Culture NO GROWTH 2 DAYS  Final   Report Status PENDING  Incomplete  Radiology Studies: No results found. Scheduled Meds: . allopurinol  200 mg Oral Daily  . collagenase   Topical Daily  . enoxaparin (LOVENOX) injection  30 mg Subcutaneous Q24H  . insulin aspart  0-5 Units Subcutaneous QHS  . insulin aspart  0-9 Units Subcutaneous TID WC  . metroNIDAZOLE  500 mg Oral Q8H  . mometasone-formoterol  2 puff Inhalation BID  . polyvinyl alcohol  1 drop Both Eyes BID  . sildenafil  20 mg Oral TID   Continuous Infusions: . aztreonam Stopped (04/03/17 1455)  . vancomycin Stopped (04/02/17 1853)    LOS: 2 days   Kerney Elbe, DO [facility] Pager [phone]  If 7PM-7AM, please contact night-coverage [email or web address] Password [facility] 04/03/2017, 3:37 PM

## 2017-04-03 NOTE — Consult Note (Addendum)
WOC re-consult: Plastic surgery just performed a consult for the leg wound; refer to previous WOC notes and Plastic surgery consult notes.  Pt has declined surgical debridement, and their team recommends Santyl and hydrotherapy to assist with removal of nonviable tissue.  Orders entered for PT department to perform this topical treatment Q Mon-Sat, beginning tomorrow. Plurogel was mentioned in the plastics consult; this topical treatment is not available in the [place]. Please re-consult if further assistance is needed.  Thank-you,  Julien Girt MSN, [place], [place], [place], [place]

## 2017-04-03 NOTE — H&P (View-Only) (Signed)
Patient ID: Angel Estrada, female   DOB: 04-08-1934, 83 y.o.   MRN: [id number]   Patient now agreeable to surgery per Dr. Alfredia Ferguson.   Discussed with Dr. Marla Roe and she will see patient to discuss further on Mon,  12/3.  In the interim, would do hydrotherapy and Santyl to help with debridement.  Imberly Troxler,PA-C Plastic Surgery [phone]

## 2017-04-03 NOTE — Consult Note (Signed)
Reason for Consult: Left lower leg wound Referring Physician: Dr. Claybon Jabs, Hospitalist Service, [place] consult [facility] Date 11/30/208  Angel Estrada is an 83 y.o. female.  HPI: Angel Estrada is a 83 yo female with past medical history of DM, COPD, CHF, HTN, Depression and osteoporosis,  who is re-admitted with worsening of her left lower leg wound.  She was admitted 11/21-11/23/2018 with left lower leg hematoma and cellulitis. She was treated with IV Vancomycin and then po doxycycline at discharge. She returned with worsening of the wound with reports that the wound was covered in cat fur, flying gnats as well as what appears to have been maggots. She was hypotensive and code sepsis protocols were begun in the ED. Plain xrays of the left lower leg showed soft tissue changes consistent with the patients wound but no SQ gas or bony abnormality. She was started on IV antibiotics, aztreonam, vancomycin and metronidazole. She was seen by the WOCN and begun on saline dressing changes.  Lactic acid 1.3, wbc up 18.6 today, BCx no growth thus far.  She reports she had problems with cellulitis starting a couple of years ago in the left lower leg.   Past Medical History:  Diagnosis Date  . CHF (congestive heart failure) ([place])   . COPD (chronic obstructive pulmonary disease) ([place])   . Depression   . Diabetes mellitus without complication ([facility])   . Hypertension   . Osteoporosis     Past Surgical History:  Procedure Laterality Date  . CATARACT EXTRACTION Bilateral   . US ECHOCARDIOGRAPHY      Family History  Problem Relation Age of Onset  . Diabetes type II Mother   . Hypertension Mother   . Brain cancer Father   . Hypertension Father   . Diabetes type II Brother   . Stroke Other     Social History:  reports that  has never smoked. she has never used smokeless tobacco. She reports that she does not drink alcohol or use drugs.  She lives with a house mate who she  reports is in poor health and does not drive. The patient reports she uses a cane and the walls of her rooms to walk at home. She report she does still have a drivers license, but has not been driving recently due to her illness.  She reports she has 13 cats residing in the home.  She has a brother and sister in [place], but no local family. No children.   Allergies:  Allergies  Allergen Reactions  . Penicillins Swelling and Rash    Site of swelling (?) Has patient had a PCN reaction causing immediate rash, facial/tongue/throat swelling, SOB or lightheadedness with hypotension: Yes Has patient had a PCN reaction causing severe rash involving mucus membranes or skin necrosis: No Has patient had a PCN reaction that required hospitalization: No Has patient had a PCN reaction occurring within the last 10 years: No If all of the above answers are "NO", then may proceed with Cephalosporin use.   Marland Kitchen Lisinopril-Hydrochlorothiazide Other (See Comments) and Cough    Pt's PCP took her off medication because of possible kidney damage  . Sulfa Antibiotics Other (See Comments)    Unknown reaction-- childhood allergy   . Latex Rash    Medications: I have reviewed the patient's current medications.  Results for orders placed or performed during the hospital encounter of 04/01/17 (from the past 48 hour(s))  Urinalysis, Routine w reflex microscopic     Status:  None   Collection Time: 04/01/17  6:20 PM  Result Value Ref Range   Color, Urine YELLOW YELLOW   APPearance CLEAR CLEAR   Specific Gravity, Urine 1.017 1.005 - 1.030   pH 5.0 5.0 - 8.0   Glucose, UA NEGATIVE NEGATIVE mg/dL   Hgb urine dipstick NEGATIVE NEGATIVE   Bilirubin Urine NEGATIVE NEGATIVE   Ketones, ur NEGATIVE NEGATIVE mg/dL   Protein, ur NEGATIVE NEGATIVE mg/dL   Nitrite NEGATIVE NEGATIVE   Leukocytes, UA NEGATIVE NEGATIVE  Lactic acid, plasma     Status: Abnormal   Collection Time: 04/01/17  7:52 PM  Result Value Ref Range    Lactic Acid, Venous 2.3 (HH) 0.5 - 1.9 mmol/L    Comment: CRITICAL RESULT CALLED TO, READ BACK BY AND VERIFIED WITH: VANHOOSE,C RN 04/01/2017 2051 JORDANS   CBG monitoring, ED     Status: Abnormal   Collection Time: 04/01/17 10:03 PM  Result Value Ref Range   Glucose-Capillary 162 (H) 65 - 99 mg/dL  Glucose, capillary     Status: None   Collection Time: 04/02/17  1:00 AM  Result Value Ref Range   Glucose-Capillary 74 65 - 99 mg/dL  Basic metabolic panel     Status: Abnormal   Collection Time: 04/02/17  2:58 AM  Result Value Ref Range   Sodium 142 135 - 145 mmol/L   Potassium 3.5 3.5 - 5.1 mmol/L   Chloride 101 101 - 111 mmol/L   CO2 34 (H) 22 - 32 mmol/L   Glucose, Bld 86 65 - 99 mg/dL   BUN 41 (H) 6 - 20 mg/dL   Creatinine, Ser 0.73 0.44 - 1.00 mg/dL   Calcium 8.2 (L) 8.9 - 10.3 mg/dL   GFR calc non Af Amer >60 >60 mL/min   GFR calc Af Amer >60 >60 mL/min    Comment: (NOTE) The eGFR has been calculated using the CKD EPI equation. This calculation has not been validated in all clinical situations. eGFR's persistently <60 mL/min signify possible Chronic Kidney Disease.    Anion gap 7 5 - 15  CBC     Status: Abnormal   Collection Time: 04/02/17  2:58 AM  Result Value Ref Range   WBC 11.9 (H) 4.0 - 10.5 K/uL   RBC 4.51 3.87 - 5.11 MIL/uL   Hemoglobin 13.9 12.0 - 15.0 g/dL   HCT 44.5 36.0 - 46.0 %   MCV 98.7 78.0 - 100.0 fL   MCH 30.8 26.0 - 34.0 pg   MCHC 31.2 30.0 - 36.0 g/dL   RDW 16.7 (H) 11.5 - 15.5 %   Platelets 344 150 - 400 K/uL  Lactic acid, plasma     Status: None   Collection Time: 04/02/17  2:58 AM  Result Value Ref Range   Lactic Acid, Venous 1.3 0.5 - 1.9 mmol/L  Glucose, capillary     Status: None   Collection Time: 04/02/17  7:16 AM  Result Value Ref Range   Glucose-Capillary 74 65 - 99 mg/dL  Glucose, capillary     Status: Abnormal   Collection Time: 04/02/17 12:27 PM  Result Value Ref Range   Glucose-Capillary 54 (L) 65 - 99 mg/dL  Glucose,  capillary     Status: None   Collection Time: 04/02/17  1:18 PM  Result Value Ref Range   Glucose-Capillary 92 65 - 99 mg/dL  Glucose, capillary     Status: Abnormal   Collection Time: 04/02/17  8:17 PM  Result Value Ref Range   Glucose-Capillary  110 (H) 65 - 99 mg/dL  CBC with Differential/Platelet     Status: Abnormal   Collection Time: 04/03/17  4:20 AM  Result Value Ref Range   WBC 18.6 (H) 4.0 - 10.5 K/uL   RBC 4.42 3.87 - 5.11 MIL/uL   Hemoglobin 13.9 12.0 - 15.0 g/dL   HCT 43.6 36.0 - 46.0 %   MCV 98.6 78.0 - 100.0 fL   MCH 31.4 26.0 - 34.0 pg   MCHC 31.9 30.0 - 36.0 g/dL   RDW 16.5 (H) 11.5 - 15.5 %   Platelets 332 150 - 400 K/uL   Neutrophils Relative % 88 %   Lymphocytes Relative 7 %   Monocytes Relative 5 %   Eosinophils Relative 0 %   Basophils Relative 0 %   Neutro Abs 16.4 (H) 1.7 - 7.7 K/uL   Lymphs Abs 1.3 0.7 - 4.0 K/uL   Monocytes Absolute 0.9 0.1 - 1.0 K/uL   Eosinophils Absolute 0.0 0.0 - 0.7 K/uL   Basophils Absolute 0.0 0.0 - 0.1 K/uL   WBC Morphology ATYPICAL LYMPHOCYTES     Comment: VACUOLATED NEUTROPHILS  Comprehensive metabolic panel     Status: Abnormal   Collection Time: 04/03/17  4:20 AM  Result Value Ref Range   Sodium 138 135 - 145 mmol/L   Potassium 3.6 3.5 - 5.1 mmol/L   Chloride 97 (L) 101 - 111 mmol/L   CO2 33 (H) 22 - 32 mmol/L   Glucose, Bld 81 65 - 99 mg/dL   BUN 31 (H) 6 - 20 mg/dL   Creatinine, Ser 0.87 0.44 - 1.00 mg/dL   Calcium 8.3 (L) 8.9 - 10.3 mg/dL   Total Protein 5.4 (L) 6.5 - 8.1 g/dL   Albumin 2.0 (L) 3.5 - 5.0 g/dL   AST 28 15 - 41 U/L   ALT 18 14 - 54 U/L   Alkaline Phosphatase 177 (H) 38 - 126 U/L   Total Bilirubin 1.0 0.3 - 1.2 mg/dL   GFR calc non Af Amer >60 >60 mL/min   GFR calc Af Amer >60 >60 mL/min    Comment: (NOTE) The eGFR has been calculated using the CKD EPI equation. This calculation has not been validated in all clinical situations. eGFR's persistently <60 mL/min signify possible Chronic  Kidney Disease.    Anion gap 8 5 - 15  Magnesium     Status: None   Collection Time: 04/03/17  4:20 AM  Result Value Ref Range   Magnesium 1.8 1.7 - 2.4 mg/dL  Phosphorus     Status: None   Collection Time: 04/03/17  4:20 AM  Result Value Ref Range   Phosphorus 2.6 2.5 - 4.6 mg/dL  Glucose, capillary     Status: Abnormal   Collection Time: 04/03/17  6:54 AM  Result Value Ref Range   Glucose-Capillary 108 (H) 65 - 99 mg/dL  Glucose, capillary     Status: Abnormal   Collection Time: 04/03/17 12:02 PM  Result Value Ref Range   Glucose-Capillary 119 (H) 65 - 99 mg/dL    No results found.  Review of Systems  Constitutional: Positive for chills, fever and malaise/fatigue.  Neurological: Positive for weakness.    Blood pressure 92/60, pulse 78, temperature (!) 97.5 F (36.4 C), temperature source Oral, resp. rate 16, height 5' (1.524 m), weight 43.1 kg (95 lb), SpO2 99 %. Physical Exam  Constitutional:  Very frail thin elderly female lying in bed in NAD.   Cardiovascular: Normal rate and regular rhythm.  Respiratory: Effort normal. No respiratory distress.  GI: Soft. She exhibits no distension.  Musculoskeletal:  Bilateral knee joints with erythema, warmth and edema. Left ankle and great toe with erythema, edema and warmth.   Skin:  Left lower leg wound is full thickness ~ 10 x 5 x 1 cm with necrotic tissue in wound bed, minimal residual clot, peri wound with erythema. Edema of left lower leg and foot and ankle. Palpable pulse left dorsalis pedis. Multiple scattered areas of ecchymosis over both lower extremities.     Assessment/Plan: Cellulitis of left lower leg- Discussed possibility of going to OR for irrigation and debridement of the wound and possible placement of Acell and VAC dressing by Dr. Marla Roe, but the patient does not want to go to the OR for debridement at this time.  I explained to the patient that this might potentially be a limb saving treatment, but the  patient stated that she wants to continue to try dressing changes for now.   Could try hydrotherapy and Santyl to the necrotic tissue to help with debridement.   Could try a new product, Plurogel ( by medline) which has non cytotoxic surfactant that aides in healing and also promotes autolytic debridement which could be continued at discharge as well.  Would recommend follow up in the [facility] if not discharged to SNF.  Consider checking pre albumin and nutrition consult.    Avea Mcgowen,PA-C Plastic Surgery [phone]

## 2017-04-03 NOTE — Progress Notes (Signed)
Patient ID: Angel Estrada, female   DOB: 05-03-1934, 83 y.o.   MRN: [id number]   Patient now agreeable to surgery per Dr. Alfredia Ferguson.   Discussed with Dr. Marla Roe and she will see patient to discuss further on Mon,  12/3.  In the interim, would do hydrotherapy and Santyl to help with debridement.  Hasina Kreager,PA-C Plastic Surgery [phone]

## 2017-04-04 ENCOUNTER — Other Ambulatory Visit: Payer: Self-pay

## 2017-04-04 ENCOUNTER — Inpatient Hospital Stay (HOSPITAL_COMMUNITY): Payer: Medicare Other

## 2017-04-04 LAB — COMPREHENSIVE METABOLIC PANEL
ALT: 15 U/L (ref 14–54)
AST: 19 U/L (ref 15–41)
Albumin: 1.8 g/dL — ABNORMAL LOW (ref 3.5–5.0)
Alkaline Phosphatase: 159 U/L — ABNORMAL HIGH (ref 38–126)
Anion gap: 7 (ref 5–15)
BUN: 24 mg/dL — AB (ref 6–20)
CHLORIDE: 95 mmol/L — AB (ref 101–111)
CO2: 33 mmol/L — AB (ref 22–32)
CREATININE: 0.79 mg/dL (ref 0.44–1.00)
Calcium: 8.4 mg/dL — ABNORMAL LOW (ref 8.9–10.3)
GFR calc Af Amer: >60 mL/min (ref >60)
Glucose, Bld: 69 mg/dL (ref 65–99)
Potassium: 3.7 mmol/L (ref 3.5–5.1)
SODIUM: 135 mmol/L (ref 135–145)
Total Bilirubin: 1.5 mg/dL — ABNORMAL HIGH (ref 0.3–1.2)
Total Protein: 4.8 g/dL — ABNORMAL LOW (ref 6.5–8.1)

## 2017-04-04 LAB — CBC WITH DIFFERENTIAL/PLATELET
BASOS ABS: 0 10*3/uL (ref 0.0–0.1)
Basophils Relative: 0 %
EOS ABS: 0 10*3/uL (ref 0.0–0.7)
Eosinophils Relative: 0 %
HCT: 39.6 % (ref 36.0–46.0)
Hemoglobin: 12.7 g/dL (ref 12.0–15.0)
LYMPHS PCT: 6 %
Lymphs Abs: 1.3 10*3/uL (ref 0.7–4.0)
MCH: 31 pg (ref 26.0–34.0)
MCHC: 32.1 g/dL (ref 30.0–36.0)
MCV: 96.6 fL (ref 78.0–100.0)
MONO ABS: 1.1 10*3/uL — AB (ref 0.1–1.0)
Monocytes Relative: 5 %
NEUTROS PCT: 89 %
Neutro Abs: 19 10*3/uL — ABNORMAL HIGH (ref 1.7–7.7)
PLATELETS: 328 10*3/uL (ref 150–400)
RBC: 4.1 MIL/uL (ref 3.87–5.11)
RDW: 16 % — ABNORMAL HIGH (ref 11.5–15.5)
WBC: 21.4 10*3/uL — AB (ref 4.0–10.5)

## 2017-04-04 LAB — LACTIC ACID, PLASMA: Lactic Acid, Venous: 1.2 mmol/L (ref 0.5–1.9)

## 2017-04-04 LAB — GLUCOSE, CAPILLARY
GLUCOSE-CAPILLARY: 104 mg/dL — AB (ref 65–99)
GLUCOSE-CAPILLARY: 143 mg/dL — AB (ref 65–99)
GLUCOSE-CAPILLARY: 111 mg/dL — AB (ref 65–99)
Glucose-Capillary: 75 mg/dL (ref 65–99)

## 2017-04-04 LAB — MAGNESIUM: Magnesium: 1.7 mg/dL (ref 1.7–2.4)

## 2017-04-04 LAB — PHOSPHORUS: Phosphorus: 2.6 mg/dL (ref 2.5–4.6)

## 2017-04-04 MED ORDER — ADULT MULTIVITAMIN W/MINERALS CH
1.0000 | ORAL_TABLET | Freq: Every day | ORAL | Status: DC
  Administered 2017-04-04 – 2017-04-11 (×7): 1 via ORAL
  Filled 2017-04-04 (×7): qty 1

## 2017-04-04 MED ORDER — ENSURE ENLIVE PO LIQD
237.0000 mL | Freq: Two times a day (BID) | ORAL | Status: DC
  Administered 2017-04-04 – 2017-04-11 (×12): 237 mL via ORAL

## 2017-04-04 NOTE — Progress Notes (Signed)
Initial Nutrition Assessment  DOCUMENTATION CODES:   Severe malnutrition in context of social or environmental circumstances  INTERVENTION:  - Will order Ensure Enlive BID, each supplement provides 350 kcal and 20 grams of protein - Will order daily multivitamin with minerals.  - Continue to encourage PO intakes of meals and supplements.   NUTRITION DIAGNOSIS:   Severe Malnutrition related to social / environmental circumstances as evidenced by severe fat depletion, severe muscle depletion.  GOAL:   Patient will meet greater than or equal to 90% of their needs  MONITOR:   PO intake, Supplement acceptance, Weight trends, Labs, Skin  REASON FOR ASSESSMENT:   Consult Assessment of nutrition requirement/status  ASSESSMENT:   83 y.o. female with medical history significant of DM, HTN, depression, and recent LE wound and other comorbid conditions. She was discharged on 11/23 with the diagnosis of left leg hematoma and surrounding infection. Initially she was improved but then began to worsen. When her wound covering was removed, the wound was covered in cat hair and flying gnats. Patient reports she has seen insects crawling inside her wound. In the ER, her leg had erythema with areas of black necrotic tissue with shiny moving areas that appear river-like.  When patient arrived into the ER, she was hypotensive and code sepsis was called. X-rays were obtained to look for osteomyelitis and subcutaneous gas both of which were negative. Plastic surgery consulted and pt debating options. Plastic Surgery recommending Hydrotherapy and Santyl for now.  BMI indicates normal weight/borderline underweight status. Per chart review, pt consumed 75% of lunch (395 kcal, 17 grams of protein) and 25% of dinner (93 kcal, 3 grams of protein) on 11/29; she ate 100% of breakfast (338 kcal, 16 grams of protein) and 100% of lunch (530 kcal, 34 grams of protein) on 11/30. Pt is unable to recall what/how much she  may have consumed for breakfast this AM. She denies abdominal pain or nausea at this time.  She reports living with another older women (who is 81 years-old) and that they take turns making meals. Their stove recently stopped working but they have a microwave. Pt does not enjoy cooking so she often eats yogurt for breakfast and frozen meals other times. She eats 3 meals/day. Pt reports that she and roommate have 13 cats and do no have help in taking care of them. She is hopeful to go to a facility after d/c to be able to work with PT as she states she is currently unable to walk well.   Pt reports that she has been losing weight over the past 2 years but is unsure how much she weighed at that time or recently. Per chart review, pt has lost 8 lbs (7.8% body weight) in the past 9 months; this is not significant for time frame.  Medications reviewed; sliding scale Novolog.  Labs reviewed; CBG: 75 mg/dL, Cl: 95 mmol/L, BUN: 24 mg/dL, Ca: 8.4 mg/dL, Alk Phos elevated.       NUTRITION - FOCUSED PHYSICAL EXAM:    Most Recent Value  Orbital Region  Moderate depletion  Upper Arm Region  Severe depletion  Thoracic and Lumbar Region  Unable to assess  Buccal Region  Moderate depletion  Temple Region  Moderate depletion  Clavicle Bone Region  Severe depletion  Clavicle and Acromion Bone Region  Severe depletion  Scapular Bone Region  Severe depletion  Dorsal Hand  Moderate depletion  Patellar Region  Unable to assess  Anterior Thigh Region  Unable to assess  Posterior Calf Region  Unable to assess  Edema (RD Assessment)  Unable to assess  Hair  Reviewed  Eyes  Other (Comment) [very red/bloodshot]  Mouth  Other (Comment) [poor dentition]  Skin  Reviewed  Nails  Reviewed       Diet Order:  Diet heart healthy/carb modified Room service appropriate? Yes; Fluid consistency: Thin  EDUCATION NEEDS:   No education needs have been identified at this time  Skin:  Skin Assessment: Skin Integrity  Issues: Skin Integrity Issues:: Stage II, Diabetic Ulcer Stage II: Sacrum x2 Diabetic Ulcer: L leg with necrotic tissue  Last BM:  11/28  Height:   Ht Readings from Last 1 Encounters:  04/01/17 5' (1.524 m)    Weight:   Wt Readings from Last 1 Encounters:  04/01/17 95 lb (43.1 kg)    Ideal Body Weight:  45.45 kg  BMI:  Body mass index is 18.55 kg/m.  Estimated Nutritional Needs:   Kcal:  1295-1510 (30-35 kcal/kg)  Protein:  55-65 grams  Fluid:  >/= 1.5 L/day      Jarome Matin, MS, RD, LDN, [facility] Inpatient Clinical Dietitian Pager # [phone] After hours/weekend pager # [phone]

## 2017-04-04 NOTE — Clinical Social Work Note (Signed)
Patient agreeable to SNF but mentioned two SNF's that CSW has never heard of. CSW provided SNF list. Patient asked that CSW not provided bed offers until after her surgery.  Angel Estrada, Angel Estrada

## 2017-04-04 NOTE — Progress Notes (Signed)
Xcover Per RN, confused,  Pt thinks that floor is upside down.  Per RN pt leaning slightly to the right.    Ros: + neck pain  Heent: anicteric, pupils 1.40m symmetric Neck: no jvd Heart: rrr s1, s2 Lung: ctab Abd: soft Ext: no c/c, slight edema left distal lower ext + cellulitis  5/5 motor strength,  Reflexes 2+ symmetric, diffuse with downgoing toes bilaterally.   A/P AMS Neck pain  CT brain , CT c spine Check MRI brain Check b12, folate, esr , ana, tsh, rpr

## 2017-04-04 NOTE — Evaluation (Signed)
Occupational Therapy Evaluation Patient Details Name: Angel Estrada MRN: [id number] DOB: 1934-01-24 Today's Date: 04/04/2017    History of Present Illness Angel Estrada is a 83 y.o. female with medical history significant of DM, pHTN, depression, and recent LE wound.  She was discharged on 11/23 with the diagnosis of left leg hematoma and surrounding infection.  She was treated with IV vanc and then transitioned to doxycycline which she finished.  initially she was improved but then began to worsen. Septic in ED   Clinical Impression   Pt reports that she was independent in ADL, and used SPC/furniture walking PTA. Pt is currently max A for LB ADL and max A for squat pivot transfer. Pt currently with functional limitations due to the deficits listed below (see OT Problem List). Has had increasing difficulty managing at home leading to this admission; Pt will benefit from skilled OT in the acute setting and afterwards at SNF for rehab to maximize independence and safety with ADL and functional transfers. Next session to work on activity  Tolerance and BSC transfer.    Follow Up Recommendations  SNF    Equipment Recommendations  Other (comment)(defer to next venue)    Recommendations for Other Services       Precautions / Restrictions Precautions Precautions: Fall Precaution Comments: Painful LLE Restrictions Weight Bearing Restrictions: No      Mobility Bed Mobility Overal bed mobility: Needs Assistance Bed Mobility: Supine to Sit;Sit to Supine     Supine to sit: Mod assist Sit to supine: Mod assist   General bed mobility comments: verbal cues for sequencing and use of bed rail; mod assist to elelvate trunk to sit; and use of bed pad to bring hips EOB; mod assist of LLE back into bed, and for scoot up in bed  Transfers Overall transfer level: Needs assistance Equipment used: None             General transfer comment: Pt requested defer of transfer due to pain in  LLE    Balance Overall balance assessment: Needs assistance;History of Falls Sitting-balance support: Bilateral upper extremity supported;Feet supported Sitting balance-Leahy Scale: Fair Sitting balance - Comments: sitting EOB, initially required min A but progressed to close min guard for safety                                   ADL either performed or assessed with clinical judgement   ADL Overall ADL's : Needs assistance/impaired Eating/Feeding: Set up   Grooming: Set up   Upper Body Bathing: Moderate assistance   Lower Body Bathing: Total assistance   Upper Body Dressing : Minimal assistance   Lower Body Dressing: Total assistance   Toilet Transfer: Maximal assistance;Squat-pivot   Toileting- Clothing Manipulation and Hygiene: Total assistance       Functional mobility during ADLs: (not attempted this session) General ADL Comments: Pt very painful LLE limiting session. Pt performed godo weight shifts to assist with bed mobility and arms are weak, but functional for UB ADL/grooming.      Vision Patient Visual Report: No change from baseline       Perception     Praxis      Pertinent Vitals/Pain Pain Assessment: Faces Faces Pain Scale: Hurts even more Pain Location: LLE pain  Pain Descriptors / Indicators: Aching;Grimacing;Guarding;Moaning Pain Intervention(s): Monitored during session;Repositioned;Limited activity within patient's tolerance     Hand Dominance     Extremity/Trunk Assessment Upper  Extremity Assessment Upper Extremity Assessment: Generalized weakness   Lower Extremity Assessment Lower Extremity Assessment: Defer to PT evaluation       Communication Communication Communication: No difficulties   Cognition Arousal/Alertness: Awake/alert Behavior During Therapy: WFL for tasks assessed/performed Overall Cognitive Status: Within Functional Limits for tasks assessed                                 General  Comments: generally disheveled appearance   General Comments       Exercises     Shoulder Instructions      Home Living Family/patient expects to be discharged to:: Skilled nursing facility Living Arrangements: Spouse/significant other   Type of Home: House                       Home Equipment: Kasandra Knudsen - single point   Additional Comments: Pt has 15 cats      Prior Functioning/Environment Level of Independence: Independent with assistive device(s)        Comments: Walks with cane and hand on the wall        OT Problem List: Decreased strength;Decreased range of motion;Decreased activity tolerance;Impaired balance (sitting and/or standing);Decreased safety awareness;Pain      OT Treatment/Interventions: Self-care/ADL training;DME and/or AE instruction;Therapeutic activities;Patient/family education;Balance training    OT Goals(Current goals can be found in the care plan section) Acute Rehab OT Goals Patient Stated Goal: to get better OT Goal Formulation: With patient Time For Goal Achievement: 04/18/17 Potential to Achieve Goals: Fair ADL Goals Pt Will Perform Grooming: with modified independence;sitting Pt Will Transfer to Toilet: with min guard assist;stand pivot transfer Pt Will Perform Toileting - Clothing Manipulation and hygiene: with supervision;sitting/lateral leans Additional ADL Goal #1: Pt will perform bed mobility as precursor to ADL at supervision level  OT Frequency: Min 2X/week   Barriers to D/C: Decreased caregiver support  Pt was struggling to manage at home, returned to hospital from recent stay with infected wound - with cat hair, bugs etc in wound       Co-evaluation              AM-PAC PT "6 Clicks" Daily Activity     Outcome Measure Help from another person eating meals?: None Help from another person taking care of personal grooming?: A Little Help from another person toileting, which includes using toliet, bedpan, or  urinal?: A Lot Help from another person bathing (including washing, rinsing, drying)?: A Lot Help from another person to put on and taking off regular upper body clothing?: A Little Help from another person to put on and taking off regular lower body clothing?: Total 6 Click Score: 15   End of Session Nurse Communication: Mobility status  Activity Tolerance: Patient limited by pain Patient left: in bed;with call bell/phone within reach;with bed alarm set  OT Visit Diagnosis: Unsteadiness on feet (R26.81);Other abnormalities of gait and mobility (R26.89);Repeated falls (R29.6);History of falling (Z91.81);Adult, failure to thrive (R62.7);Pain Pain - Right/Left: Left Pain - part of body: Leg                Time: 2130-8657 OT Time Calculation (min): 17 min Charges:  OT General Charges $OT Visit: 1 Visit OT Evaluation $OT Eval Moderate Complexity: 1 Mod G-Codes:     Hulda Humphrey OTR/L [place] 04/04/2017, 5:19 PM

## 2017-04-04 NOTE — Progress Notes (Signed)
Physical Therapy Wound Treatment Patient Details  Name: Erza Mothershead MRN: [id number] Date of Birth: 11/16/1933  Today's Date: 04/04/2017 Time: 4010-2725 Time Calculation (min): 35 min  Subjective  Subjective: "It's not too bad"  (pain during pulse-lavage) Patient and Family Stated Goals: To heal wound Date of Onset: (unsure) Prior Treatments: During prior admission with hematoma  Pain Score: Pain Score: 4   Wound Assessment  Wound / Incision (Open or Dehisced) 04/04/17 Diabetic ulcer Leg Left;Lower (Active)  Dressing Type Gauze (Comment);ABD 04/04/2017  9:48 AM  Dressing Changed Changed 04/04/2017  9:48 AM  Dressing Status Clean;Dry;Intact 04/04/2017  9:48 AM  Dressing Change Frequency Daily 04/04/2017  9:48 AM  Site / Wound Assessment Black;Red;Yellow 04/04/2017  9:48 AM  % Wound base Red or Granulating 15% 04/04/2017  9:48 AM  % Wound base Yellow/Fibrinous Exudate 5% 04/04/2017  9:48 AM  % Wound base Black/Eschar 80% 04/04/2017  9:48 AM  Peri-wound Assessment Edema;Purple 04/04/2017  9:48 AM  Wound Length (cm) 5 cm 04/04/2017  9:48 AM  Wound Width (cm) 5 cm 04/04/2017  9:48 AM  Wound Depth (cm) 0.5 cm 04/04/2017  9:48 AM  Wound Volume (cm^3) 12.5 cm^3 04/04/2017  9:48 AM  Wound Surface Area (cm^2) 25 cm^2 04/04/2017  9:48 AM  Undermining (cm) 0.5 cm in depth located at 11:00 on clock. 04/04/2017  9:48 AM  Margins Unattached edges (unapproximated) 04/04/2017  9:48 AM  Closure None 04/04/2017  9:48 AM  Drainage Amount Minimal 04/04/2017  9:48 AM  Drainage Description Sanguineous;No odor 04/04/2017  9:48 AM  Treatment Cleansed;Debridement (Selective);Hydrotherapy (Pulse lavage);Packing (Saline gauze) 04/04/2017  9:48 AM   Hydrotherapy Pulsed lavage therapy - wound location: LLE, Anterior lower leg Pulsed Lavage with Suction (psi): 8 psi Pulsed Lavage with Suction - Normal Saline Used: 1000 mL Pulsed Lavage Tip: Tip with splash shield Selective Debridement Selective Debridement - Location:  LLE, anterior shin Selective Debridement - Tools Used: Forceps;Scissors Selective Debridement - Tissue Removed: Thick black covering (dried/hardened blood), and part of blood clot beneath.     Wound Assessment and Plan  Wound Therapy - Assess/Plan/Recommendations Wound Therapy - Clinical Statement: Patient with LLE anterior shin wound, with majority of hardened blood covering removed during session.  Patient will benefit from Pulsed Lavage and debridement to clean wound to facilitate healing. Wound Therapy - Functional Problem List: Decreased mobility due to wound/pain Factors Delaying/Impairing Wound Healing: Diabetes Mellitus;Infection - systemic/local;Multiple medical problems Hydrotherapy Plan: Debridement;Dressing change;Patient/family education;Pulsatile lavage with suction Wound Therapy - Frequency: 6X / week Wound Therapy - Current Recommendations: Nutritionist Wound Therapy - Follow Up Recommendations: [facility] Wound Plan: as above  Wound Therapy Goals- Improve the function of patient's integumentary system by progressing the wound(s) through the phases of wound healing (inflammation - proliferation - remodeling) by: Decrease Necrotic Tissue to: 75 Decrease Necrotic Tissue - Progress: Goal set today Increase Granulation Tissue to: 25 Increase Granulation Tissue - Progress: Goal set today Improve Drainage Characteristics: Serous Improve Drainage Characteristics - Progress: Goal set today Patient/Family will be able to : Patient will understand reason for and importance of keeping wound clean. Patient/Family Instruction Goal - Progress: Goal set today Goals/treatment plan/discharge plan were made with and agreed upon by patient/family: Yes Time For Goal Achievement: 2 weeks Wound Therapy - Potential for Goals: Good  Goals will be updated until maximal potential achieved or discharge criteria met.  Discharge criteria: when goals achieved, discharge from hospital, MD  decision/surgical intervention, no progress towards goals, refusal/missing three consecutive treatments without  notification or medical reason.  GP     Despina Pole 04/04/2017, 11:14 AM Carita Pian. Sanjuana Kava, [place] Pager [phone]

## 2017-04-04 NOTE — Significant Event (Signed)
Rapid Response Event Note  Overview: Time Called: 2100 Arrival Time: 2100 Event Type: Neurologic  Initial Focused Assessment: New onset confusion   Interventions: Repeat lactic acid  Plan of Care (if not transferred):  Event Summary: Called to evaluate patient of new onset confusion. Patient's admission history was reviewed. She is currently oriented to self. Disoriented to time and place. Skin is warm and dry. Heart sounds regular is tachy at 114. Lungs sounds diminished. No c/o pain and shortness of breath. No current fever, rash or dizziness. Recommended repeating lactic acid level due to history, HR and change in mental status. Unit RN to notify oncall MD of onset of confusion.    Eino Farber [place]

## 2017-04-04 NOTE — Progress Notes (Signed)
PROGRESS NOTE    Angel Estrada  PJA:[id number] DOB: 08/23/1933 DOA: 04/01/2017  PCP: Jani Gravel, MD   Brief Narrative:  Angel Estrada is a 83 y.o. female with medical history significant of DM, pHTN, depression, and recent LE wound and other comrobids.  She was discharged on 11/23 with the diagnosis of left leg hematoma and surrounding infection.  She was treated with IV vanc and then transitioned to doxycycline which she finished. Initially she was improved but then began to worsen.   When her wound covering was removed, the wound was covered in cat hair and flying knats.   Patient reports she has seen insects crawling inside her wound.     In the ER, her leg had erythema with areas of black necrotic tissue with shiny moving areas that appear river-like.  When patient arrived into the ER, she was hypotensive and code sepsis was called. Lactic acid was elevated so IVF was given with improvement in her blood pressure. X rays were obtained to look for osteomyelitis and subcutaneous gas both of which were negative.  Patient was given IV Abx and hospitalist were called to admit. Plastic Surgery was called to evaluate the wound for possible debridement and patient initially declined surgical debridement but wants to speak with Plastic Surgery again in terms of what it would entail. Plastic Surgery recommending Hydrotherapy and Santyl for now as there is no Plurogel.    04/04/17 - Patient seen. Nil complaints. Plastic Surgery to discuss surgery with patient on Monday. No fever or chills.  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 -[place] with Dr. Marla Roe of Plastic Surgery who will see the patient in consultation for possible Debridement; Plastic Surgery evaluated discussed possibility of going to the OR for I and D of the wound and possible placement of Acell and VAC dressing by Dr. Marla Roe -Patient Declined Surgical Option for Debridement however changed her mind and wanted to speak with Plastic Surgery again -Plastic Surgery recommending trying Hydrotherapy and Santyl to Necrotic Tissue -Plastic Surgery also Recommended Plurogel but [facility] does not carry it on Formulary  -Continue IV abx- Vanc, Aztreonam, and Metronidazole for now -ABI pending  -Duplex LE to r/o clot pending  -Trend lactic acid and Trended down -WBC went from 13.7 -> 11.9 -> 18.6 -Blood Cx x2 Showed NGTD at 2 day  -PT/OT Evaluate and Treat  -Nutritionist Consulted  -Repeat CBC in AM   Lactic Acidosis -Likely from Above -LA Level went from 3.92 -> 2.3 -> 1.3  Chronic Diastolic CHF -Stable and appears Euvolemic -BNP was 91.6  -limit IVF to avoid overload -Patient is +2.460 Liters   Pulmonary Hypertension  -Resume home meds of Sildenafil 20 mg po TID  DM -C/w Sensitive Novolog SSI AC/HS -CBG's range from 108-119  HTN -Hold Home Medications while BP on lower side  COPD -Currently not exacerbation -C/w Albuterol 2.5 mg IH q6hprn wheezing/SOB and Symbicort Substitution  with Dulera 2 puff IH BID  DVT prophylaxis: Enoxaparin 30 mg sq q24h Code Status: FULL CODE Family Communication: No family present at bedside Disposition Plan: Remain Inpatient for further Workup and Evaluation  Consultants:   [place] Nurse  Discussed Case with Orthopedic Surgery Dr. Stann Mainland  Plastic Surgery Consultation Dr. Lyndee Leo  [place]  Procedures:  None   Antimicrobials:  Anti-infectives (From admission, onward)   Start     Dose/Rate Route Frequency Ordered Stop   04/03/17 1400  metroNIDAZOLE (FLAGYL) tablet 500 mg     500 mg Oral Every 8 hours 04/03/17 0955     04/02/17 1600  vancomycin (VANCOCIN) IVPB 750 mg/150 ml premix     750 mg 150 mL/hr over 60 Minutes Intravenous Every 24 hours 04/01/17 1819     04/02/17 0945  metroNIDAZOLE (FLAGYL) IVPB 500 mg  Status:  Discontinued     500 mg 100 mL/hr over 60 Minutes Intravenous Every 8 hours 04/02/17 0935 04/03/17 0955   04/01/17 2200  aztreonam (AZACTAM) 1 g in dextrose 5 % 50 mL IVPB     1 g 100 mL/hr over 30 Minutes Intravenous Every 8 hours 04/01/17 1819     04/01/17 1215  aztreonam (AZACTAM) 2 g in dextrose 5 % 50 mL IVPB     2 g 100 mL/hr over 30 Minutes Intravenous  Once 04/01/17 1208 04/01/17 1344   04/01/17 1215  metroNIDAZOLE (FLAGYL) IVPB 500 mg     500 mg 100 mL/hr over 60 Minutes Intravenous  Once 04/01/17 1208 04/01/17 1445   04/01/17 1215  vancomycin (VANCOCIN) IVPB 1000 mg/200 mL premix     1,000 mg 200 mL/hr over 60 Minutes Intravenous  Once 04/01/17 1208 04/01/17 1603     Subjective: Seen and examined and states she slept ok. States leg hurts whenever it is picked up. No CP or SOB. Initially declined surgical debridement but wants to speak with Plastic Surgery again.    Objective: Vitals:   04/03/17 1300 04/03/17 2045 04/04/17 0618 04/04/17 1334  BP: 96/61 (!) 88/52 (!) 93/58 (!) 93/52  Pulse: 82 95 88 (!) 18  Resp: 16   18  Temp: 98.9 F (37.2 C) 98.6 F (37 C) 99.1 F (37.3 C) 98.5 F (36.9 C)  TempSrc: Oral Oral Oral Oral  SpO2: 97% 92% 94% 100%  Weight:      Height:        Intake/Output Summary (Last 24 hours) at 04/04/2017 1337 Last data filed at 04/04/2017 0347 Gross per 24 hour  Intake 0 ml  Output 650 ml  Net -650 ml   Filed Weights   04/01/17 1128  Weight: 43.1 kg (95 lb)   Examination: Physical  Exam:  Constitutional: Thin Cachetic appearing elderly Caucasian female in NAD Eyes: Sclerae anicteric. Lids normal ENMT: External Ears and nose appear normal Neck: Appears normal. Supple Respiratory: Diminished but clear to Ausculation. Unlabored breathing Cardiovascular: RRR; 2/6 Systolic Murmur. 1+ LE edema  Abdomen: Soft, NT, ND. Bowel Sounds present Musculoskeletal: No contractures; No cyanosis Skin: Has a large necrotic ulcer on the Left anterior shin with congealed blood; No appreciable maggots/bugs today. Has Lower Extremity edema on the left leg and foot. Has some brusing on right Neurologic: CN 2-12 grossly intact. No appreciable focal deficits Psychiatric: Normal mood and affect. Intact judgement and insight.   Data Reviewed: I have personally reviewed following labs and imaging studies  CBC: Recent Labs  Lab 04/01/17 1242 04/02/17 0258 04/03/17 0420 04/04/17 0603  WBC 13.7*  11.9* 18.6* 21.4*  NEUTROABS 12.1*  --  16.4* 19.0*  HGB 17.1* 13.9 13.9 12.7  HCT 52.6* 44.5 43.6 39.6  MCV 99.2 98.7 98.6 96.6  PLT 314 344 332 673   Basic Metabolic Panel: Recent Labs  Lab 04/01/17 1245 04/02/17 0258 04/03/17 0420 04/04/17 0603  NA 142 142 138 135  K 3.8 3.5 3.6 3.7  CL 95* 101 97* 95*  CO2 34* 34* 33* 33*  GLUCOSE 118* 86 81 69  BUN 58* 41* 31* 24*  CREATININE 0.91 0.73 0.87 0.79  CALCIUM 9.2 8.2* 8.3* 8.4*  MG  --   --  1.8 1.7  PHOS  --   --  2.6 2.6   GFR: Estimated Creatinine Clearance: 37.5 mL/min (by C-G formula based on SCr of 0.79 mg/dL). Liver Function Tests: Recent Labs  Lab 04/03/17 0420 04/04/17 0603  AST 28 19  ALT 18 15  ALKPHOS 177* 159*  BILITOT 1.0 1.5*  PROT 5.4* 4.8*  ALBUMIN 2.0* 1.8*   No results for input(s): LIPASE, AMYLASE in the last 168 hours. No results for input(s): AMMONIA in the last 168 hours. Coagulation Profile: No results for input(s): INR, PROTIME in the last 168 hours. Cardiac Enzymes: No results for input(s):  CKTOTAL, CKMB, CKMBINDEX, TROPONINI in the last 168 hours. BNP (last 3 results) No results for input(s): PROBNP in the last 8760 hours. HbA1C: No results for input(s): HGBA1C in the last 72 hours. CBG: Recent Labs  Lab 04/03/17 0654 04/03/17 1202 04/03/17 2050 04/04/17 0621 04/04/17 1202  GLUCAP 108* 119* 115* 75 104*   Lipid Profile: No results for input(s): CHOL, HDL, LDLCALC, TRIG, CHOLHDL, LDLDIRECT in the last 72 hours. Thyroid Function Tests: No results for input(s): TSH, T4TOTAL, FREET4, T3FREE, THYROIDAB in the last 72 hours. Anemia Panel: No results for input(s): VITAMINB12, FOLATE, FERRITIN, TIBC, IRON, RETICCTPCT in the last 72 hours. Sepsis Labs: Recent Labs  Lab 04/01/17 1250 04/01/17 1952 04/02/17 0258  LATICACIDVEN 3.92* 2.3* 1.3    Recent Results (from the past 240 hour(s))  Culture, blood (routine x 2)     Status: None   Collection Time: 03/25/17  3:38 PM  Result Value Ref Range Status   Specimen Description BLOOD RIGHT ANTECUBITAL  Final   Special Requests IN PEDIATRIC BOTTLE Blood Culture adequate volume  Final   Culture   Final    NO GROWTH 5 DAYS Performed at [facility], [place] [address] [place], [place] [address]    Report Status 03/30/2017 FINAL  Final  Culture, blood (routine x 2)     Status: None   Collection Time: 03/25/17  4:34 PM  Result Value Ref Range Status   Specimen Description BLOOD BLOOD RIGHT HAND  Final   Special Requests IN PEDIATRIC BOTTLE Blood Culture adequate volume  Final   Culture   Final    NO GROWTH 5 DAYS Performed at [facility], [place] [address], [place], [place] [address]    Report Status 03/30/2017 FINAL  Final  Blood Culture (routine x 2)     Status: None (Preliminary result)   Collection Time: 04/01/17 12:39 PM  Result Value Ref Range Status   Specimen Description BLOOD RIGHT ANTECUBITAL  Final   Special Requests IN PEDIATRIC BOTTLE Blood Culture adequate volume  Final   Culture NO GROWTH 3 DAYS   Final   Report Status PENDING  Incomplete  Blood Culture (routine x 2)     Status: None (Preliminary result)  Collection Time: 04/01/17 12:45 PM  Result Value Ref Range Status   Specimen Description BLOOD LEFT ANTECUBITAL  Final   Special Requests IN PEDIATRIC BOTTLE Blood Culture adequate volume  Final   Culture NO GROWTH 3 DAYS  Final   Report Status PENDING  Incomplete    Radiology Studies: No results found. Scheduled Meds: . allopurinol  200 mg Oral Daily  . collagenase   Topical Daily  . enoxaparin (LOVENOX) injection  30 mg Subcutaneous Q24H  . feeding supplement (ENSURE ENLIVE)  237 mL Oral BID BM  . insulin aspart  0-5 Units Subcutaneous QHS  . insulin aspart  0-9 Units Subcutaneous TID WC  . metroNIDAZOLE  500 mg Oral Q8H  . mometasone-formoterol  2 puff Inhalation BID  . multivitamin with minerals  1 tablet Oral Daily  . polyvinyl alcohol  1 drop Both Eyes BID  . sildenafil  20 mg Oral TID   Continuous Infusions: . aztreonam 1 g (04/04/17 1318)  . vancomycin 750 mg (04/03/17 1539)    LOS: 3 days   Bonnell Public, MD [facility] Pager [phone]  If 7PM-7AM, please contact night-coverage [email or web address] Password [email or web address] 04/04/2017, 1:37 PM

## 2017-04-04 NOTE — Evaluation (Signed)
Physical Therapy Evaluation Patient Details Name: Angel Estrada MRN: [id number] DOB: Sep 17, 1933 Today's Date: 04/04/2017   History of Present Illness  Angel Estrada is a 83 y.o. female with medical history significant of DM, pHTN, depression, and recent LE wound.  She was discharged on 11/23 with the diagnosis of left leg hematoma and surrounding infection.  She was treated with IV vanc and then transitioned to doxycycline which she finished.  initially she was improved but then began to worsen. Septic in ED  Clinical Impression   Pt admitted with above diagnosis. Pt currently with functional limitations due to the deficits listed below (see PT Problem List). Has had increasing difficulty managing at home leading to this admission; Rec SNF for rehab to maximize independence and safety with mobility;  Pt will benefit from skilled PT to increase their independence and safety with mobility to allow discharge to the venue listed below.       Follow Up Recommendations SNF    Equipment Recommendations  Other (comment)(tbd at SNF)    Recommendations for Other Services       Precautions / Restrictions Precautions Precautions: Fall Precaution Comments: Painful LLE Restrictions Weight Bearing Restrictions: No      Mobility  Bed Mobility Overal bed mobility: Needs Assistance Bed Mobility: Supine to Sit;Sit to Supine     Supine to sit: Mod assist Sit to supine: Mod assist   General bed mobility comments: Step by step cues and use of bed rails; mod assist to elelvate trunk to sit; mod assist of LLE back into bed  Transfers Overall transfer level: Needs assistance Equipment used: Rolling walker (2 wheeled) Transfers: Sit to/from [employer] Sit to Stand: Max assist   Squat pivot transfers: Min assist     General transfer comment: Attmepted standing to RW, however pt unable even with max assist due to pain; simulated squat pivot transfers to move body towards HOB  prior to laying back down  Ambulation/Gait             General Gait Details: Unable today  Stairs            Wheelchair Mobility    Modified Rankin (Stroke Patients Only)       Balance     Sitting balance-Leahy Scale: Fair       Standing balance-Leahy Scale: Zero                               Pertinent Vitals/Pain Pain Assessment: Faces Faces Pain Scale: Hurts whole lot Pain Location: LLE pain limiting any weight bearing Pain Descriptors / Indicators: Aching;Grimacing;Guarding Pain Intervention(s): Limited activity within patient's tolerance;Monitored during session    Home Living Family/patient expects to be discharged to:: Skilled nursing facility Living Arrangements: Spouse/significant other   Type of Home: House         Home Equipment: Kasandra Knudsen - single point      Prior Function Level of Independence: Independent with assistive device(s)         Comments: Walks with cane and hand on the wall     Hand Dominance        Extremity/Trunk Assessment   Upper Extremity Assessment Upper Extremity Assessment: Generalized weakness    Lower Extremity Assessment Lower Extremity Assessment: Generalized weakness;LLE deficits/detail LLE Deficits / Details: LLE wound dressed; audible crepitus knees during transfer; decr AROM throughout, limited by pain       Communication   Communication: No difficulties  Cognition Arousal/Alertness: Awake/alert Behavior During Therapy: WFL for tasks assessed/performed Overall Cognitive Status: Within Functional Limits for tasks assessed                                 General Comments: generally disheveled appearance      General Comments General comments (skin integrity, edema, etc.): Noted small area of skin tear on her back; RN ntoified adn applied dressing    Exercises     Assessment/Plan    PT Assessment Patient needs continued PT services  PT Problem List Decreased  strength;Decreased activity tolerance;Decreased balance;Decreased knowledge of use of DME;Decreased mobility       PT Treatment Interventions DME instruction;Gait training;Functional mobility training;Therapeutic exercise;Balance training;Patient/family education;Therapeutic activities    PT Goals (Current goals can be found in the Care Plan section)  Acute Rehab PT Goals Patient Stated Goal: to get better PT Goal Formulation: With patient Time For Goal Achievement: 04/18/17 Potential to Achieve Goals: Good    Frequency Min 2X/week   Barriers to discharge Decreased caregiver support      Co-evaluation               AM-PAC PT "6 Clicks" Daily Activity  Outcome Measure Difficulty turning over in bed (including adjusting bedclothes, sheets and blankets)?: Unable Difficulty moving from lying on back to sitting on the side of the bed? : Unable Difficulty sitting down on and standing up from a chair with arms (e.g., wheelchair, bedside commode, etc,.)?: Unable Help needed moving to and from a bed to chair (including a wheelchair)?: A Lot Help needed walking in hospital room?: Total Help needed climbing 3-5 steps with a railing? : Total 6 Click Score: 7    End of Session Equipment Utilized During Treatment: Gait belt Activity Tolerance: Patient limited by pain Patient left: in bed;with call bell/phone within reach;with nursing/sitter in room Nurse Communication: Mobility status PT Visit Diagnosis: Unsteadiness on feet (R26.81);History of falling (Z91.81);Pain Pain - Right/Left: Left Pain - part of body: Leg    Time: 4403-4742 PT Time Calculation (min) (ACUTE ONLY): 22 min   Charges:   PT Evaluation $PT Eval Moderate Complexity: 1 Mod     PT G Codes:        Roney Marion, PT  Acute Rehabilitation Services Pager [phone] Office [phone]   Colletta [place] 04/04/2017, 12:25 PM

## 2017-04-04 NOTE — Plan of Care (Signed)
  Nutrition: Adequate nutrition will be maintained 04/04/2017 1837 - Progressing by Governor Rooks, RN   Pain Managment: General experience of comfort will improve 04/04/2017 1837 - Progressing by Governor Rooks, RN

## 2017-04-05 ENCOUNTER — Inpatient Hospital Stay (HOSPITAL_COMMUNITY): Payer: Medicare Other

## 2017-04-05 LAB — VANCOMYCIN, TROUGH: Vancomycin Tr: 13 ug/mL — ABNORMAL LOW (ref 15–20)

## 2017-04-05 LAB — GLUCOSE, CAPILLARY
Glucose-Capillary: 96 mg/dL (ref 65–99)
Glucose-Capillary: 198 mg/dL — ABNORMAL HIGH (ref 65–99)
Glucose-Capillary: 122 mg/dL — ABNORMAL HIGH (ref 65–99)

## 2017-04-05 LAB — RPR: RPR: NONREACTIVE

## 2017-04-05 LAB — TSH: TSH: 4.3 u[IU]/mL (ref 0.350–4.500)

## 2017-04-05 LAB — SEDIMENTATION RATE: Sed Rate: 75 mm/hr — ABNORMAL HIGH (ref 0–22)

## 2017-04-05 LAB — LACTIC ACID, PLASMA: Lactic Acid, Venous: 0.9 mmol/L (ref 0.5–1.9)

## 2017-04-05 LAB — VITAMIN B12: Vitamin B-12: 484 pg/mL (ref 180–914)

## 2017-04-05 MED ORDER — VANCOMYCIN HCL IN DEXTROSE 1-5 GM/200ML-% IV SOLN
1000.0000 mg | INTRAVENOUS | Status: DC
  Administered 2017-04-05 – 2017-04-08 (×4): 1000 mg via INTRAVENOUS
  Filled 2017-04-05 (×5): qty 200

## 2017-04-05 NOTE — Progress Notes (Signed)
Pt is becoming more confused again, calling out saying that she is on the floor and needs to get back in bed but pt is still in bed. RN and NT reassuring her that she is in bed. Dr. Marthenia Rolling aware of change to confusion.

## 2017-04-05 NOTE — Progress Notes (Signed)
Patient is off the floor to Radiology.

## 2017-04-05 NOTE — NC FL2 (Signed)
[place] LEVEL OF CARE SCREENING TOOL     IDENTIFICATION  Patient Name: Angel Estrada Birthdate: [birth year] Sex: female Admission Date (Current Location): 04/01/2017  [facility] and [place] Number:  Herbalist and Address:  [facility], [place] [address] [place], [place] [address]      Provider Number: [id number]  Attending Physician Name and Address:  Bonnell Public, MD  Relative Name and Phone Number:       Current Level of Care: Hospital Recommended Level of Care: [place] Prior Approval Number:    Date Approved/Denied:   PASRR Number: [id number]  Discharge Plan: SNF    Current Diagnoses: Patient Active Problem List   Diagnosis Date Noted  . Sepsis affecting skin ([place]) 04/01/2017  . 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 Abscess 03/25/2017  . Pulmonary hypertension ([place]) 03/25/2017  . Exertional dyspnea 02/13/2016  . Idiopathic scoliosis 02/13/2016  . CHF exacerbation ([place]) 07/31/2015  . Hypertension 07/30/2015  . COPD (chronic obstructive pulmonary disease) ([place]) 07/30/2015  . CHF (congestive heart failure) ([place]) 07/30/2015  . Diabetes mellitus ([place]) 07/30/2015  . Acute on chronic diastolic CHF (congestive heart failure), NYHA class 1 ([place]) 07/30/2015    Orientation RESPIRATION BLADDER Height & Weight     Self, Time, Situation, Place  O2(Nasal Canula 3 L) Incontinent, External catheter Weight: 95 lb (43.1 kg) Height:  5' (152.4 cm)  BEHAVIORAL SYMPTOMS/MOOD NEUROLOGICAL BOWEL NUTRITION STATUS  (None) (None) Continent Diet(Heart healthy/carb modified)  AMBULATORY STATUS COMMUNICATION OF NEEDS Skin   Extensive Assist Verbally Bruising, Other (Comment), PU Stage and Appropriate Care(Cellulitis, Skin tear. Diabetic ulcer on left lower leg: ABD, Gauze, and Moist to dry daily.)   PU Stage 2 Dressing: (Lower back: Foam prn. Sacrum: Foam prn.)                   Personal Care Assistance  Level of Assistance  Bathing, Feeding, Dressing Bathing Assistance: Maximum assistance Feeding assistance: Limited assistance Dressing Assistance: Maximum assistance     Functional Limitations Info  Sight, Hearing, Speech Sight Info: Adequate Hearing Info: Adequate Speech Info: Adequate    SPECIAL CARE FACTORS FREQUENCY  PT (By licensed PT), Blood pressure, OT (By licensed OT)     PT Frequency: 5 x week OT Frequency: 5 x week            Contractures Contractures Info: Not present    Additional Factors Info  Code Status, Allergies Code Status Info: Full Allergies Info: Penicillins, Lisinopril-hydrochlorothiazide, Sulfa Antibiotics, Latex.           Current Medications (04/05/2017):  This is the current hospital active medication list Current Facility-Administered Medications  Medication Dose Route Frequency Provider Last Rate Last Dose  . acetaminophen (TYLENOL) tablet 650 mg  650 mg Oral Q6H PRN Eulogio Bear U, DO       Or  . acetaminophen (TYLENOL) suppository 650 mg  650 mg Rectal Q6H PRN Eliseo Squires, Jessica U, DO      . albuterol (PROVENTIL) (2.5 MG/3ML) 0.083% nebulizer solution 2.5 mg  2.5 mg Inhalation Q6H PRN Eulogio Bear U, DO      . allopurinol (ZYLOPRIM) tablet 200 mg  200 mg Oral Daily Vann, Jessica U, DO   200 mg at 04/05/17 0950  . aztreonam (AZACTAM) 1 g in dextrose 5 % 50 mL IVPB  1 g Intravenous Q8H Lavenia Atlas, RPH   Stopped at 04/05/17 1353  . collagenase (SANTYL) ointment  Topical Daily Sheikh, Omair Latif, DO      . enoxaparin (LOVENOX) injection 30 mg  30 mg Subcutaneous Q24H Romona Curls, [place]   30 mg at 04/04/17 2111  . feeding supplement (ENSURE ENLIVE) (ENSURE ENLIVE) liquid 237 mL  237 mL Oral BID BM Dana Allan I, MD   237 mL at 04/05/17 1244  . fluticasone (FLONASE) 50 MCG/ACT nasal spray 2 spray  2 spray Each Nare Daily PRN Vann, Jessica U, DO      . insulin aspart (novoLOG) injection 0-5 Units  0-5 Units Subcutaneous QHS Vann,  Jessica U, DO      . insulin aspart (novoLOG) injection 0-9 Units  0-9 Units Subcutaneous TID WC Vann, Jessica U, DO   2 Units at 04/05/17 1244  . metroNIDAZOLE (FLAGYL) tablet 500 mg  500 mg Oral Q8H Sheikh, Omair [place], DO   500 mg at 04/05/17 1243  . mometasone-formoterol (DULERA) 200-5 MCG/ACT inhaler 2 puff  2 puff Inhalation BID Eulogio Bear U, DO   2 puff at 04/02/17 2005  . multivitamin with minerals tablet 1 tablet  1 tablet Oral Daily Dana Allan I, MD   1 tablet at 04/05/17 0950  . ondansetron (ZOFRAN) tablet 4 mg  4 mg Oral Q6H PRN Eulogio Bear U, DO       Or  . ondansetron (ZOFRAN) injection 4 mg  4 mg Intravenous Q6H PRN Vann, Jessica U, DO      . polyvinyl alcohol (LIQUIFILM TEARS) 1.4 % ophthalmic solution 1 drop  1 drop Both Eyes BID Eliseo Squires, Jessica U, DO   1 drop at 04/05/17 0950  . sildenafil (REVATIO) tablet 20 mg  20 mg Oral TID Eulogio Bear U, DO   20 mg at 04/04/17 2112  . vancomycin (VANCOCIN) IVPB 750 mg/150 ml premix  750 mg Intravenous Q24H Lavenia Atlas, [facility]   Stopped at 04/04/17 1742     Discharge Medications: Please see discharge summary for a list of discharge medications.  Relevant Imaging Results:  Relevant Lab Results:   Additional Information SS#: [id number]  Candie Chroman, LCSW

## 2017-04-05 NOTE — Progress Notes (Signed)
PROGRESS NOTE    Angel Estrada  HQP:[id number] DOB: [birth year] DOA: 04/01/2017  PCP: Jani Gravel, MD   Brief Narrative:  Angel Estrada is a 83 y.o. female with medical history significant of DM, pHTN, depression, and recent LE wound and other comrobids.  She was discharged on 11/23 with the diagnosis of left leg hematoma and surrounding infection.  She was treated with IV vanc and then transitioned to doxycycline which she finished. Initially she was improved but then began to worsen.   When her wound covering was removed, the wound was covered in cat hair and flying knats.   Patient reports she has seen insects crawling inside her wound.     In the ER, her leg had erythema with areas of black necrotic tissue with shiny moving areas that appear river-like.  When patient arrived into the ER, she was hypotensive and code sepsis was called. Lactic acid was elevated so IVF was given with improvement in her blood pressure. X rays were obtained to look for osteomyelitis and subcutaneous gas both of which were negative.  Patient was given IV Abx and hospitalist were called to admit. Plastic Surgery was called to evaluate the wound for possible debridement and patient initially declined surgical debridement but wants to speak with Plastic Surgery again in terms of what it would entail. Plastic Surgery recommending Hydrotherapy and Santyl for now as there is no Plurogel.    04/05/17 - Patient seen. Patient is now agreeing to consider surgery. Nil complaints. Plastic Surgery to discuss surgery with patient on Monday. No fever or chills.  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 -[place] with Dr. Marla Roe of Plastic Surgery who will see the patient in consultation for possible Debridement; Plastic Surgery evaluated discussed possibility of going to the OR for I and D of the wound and possible placement of Acell and VAC dressing by Dr. Marla Roe -Patient Declined Surgical Option for Debridement however changed her mind and wanted to speak with Plastic Surgery again -Plastic Surgery recommending trying Hydrotherapy and Santyl to Necrotic Tissue -Plastic Surgery also Recommended Plurogel but [facility] does not carry it on Formulary  -Continue IV abx- Vanc, Aztreonam, and Metronidazole for now -ABI pending  -Duplex LE to r/o clot pending  -Trend lactic acid and Trended down -WBC went from 13.7 -> 11.9 -> 18.6 -Blood Cx x2 Showed NGTD at 2 day  -PT/OT Evaluate and Treat  -Nutritionist Consulted  -Repeat CBC in AM   Lactic Acidosis -Likely from Above -LA Level went from 3.92 -> 2.3 -> 1.3  Chronic Diastolic CHF -Stable and appears Euvolemic -BNP was 91.6  -limit IVF to avoid overload -Patient is +2.460 Liters   Pulmonary Hypertension  -Resume home meds of Sildenafil 20 mg po TID  DM -C/w Sensitive Novolog SSI AC/HS -CBG's range from 108-119  HTN -Hold Home Medications while BP on lower side  COPD -Currently not exacerbation -C/w Albuterol 2.5  mg IH q6hprn wheezing/SOB and Symbicort Substitution with Dulera 2 puff IH BID  DVT prophylaxis: Enoxaparin 30 mg sq q24h Code Status: FULL CODE Family Communication: No family present at bedside Disposition Plan: Remain Inpatient for further Workup and Evaluation  Consultants:   [facility] Nurse  Discussed Case with Orthopedic Surgery Dr.  Stann Mainland  Plastic Surgery Consultation Dr. Lyndee Leo Dillingham  Procedures:  None   Antimicrobials:  Anti-infectives (From admission, onward)   Start     Dose/Rate Route Frequency Ordered Stop   04/03/17 1400  metroNIDAZOLE (FLAGYL) tablet 500 mg     500 mg Oral Every 8 hours 04/03/17 0955     04/02/17 1600  vancomycin (VANCOCIN) IVPB 750 mg/150 ml premix     750 mg 150 mL/hr over 60 Minutes Intravenous Every 24 hours 04/01/17 1819     04/02/17 0945  metroNIDAZOLE (FLAGYL) IVPB 500 mg  Status:  Discontinued     500 mg 100 mL/hr over 60 Minutes Intravenous Every 8 hours 04/02/17 0935 04/03/17 0955   04/01/17 2200  aztreonam (AZACTAM) 1 g in dextrose 5 % 50 mL IVPB     1 g 100 mL/hr over 30 Minutes Intravenous Every 8 hours 04/01/17 1819     04/01/17 1215  aztreonam (AZACTAM) 2 g in dextrose 5 % 50 mL IVPB     2 g 100 mL/hr over 30 Minutes Intravenous  Once 04/01/17 1208 04/01/17 1344   04/01/17 1215  metroNIDAZOLE (FLAGYL) IVPB 500 mg     500 mg 100 mL/hr over 60 Minutes Intravenous  Once 04/01/17 1208 04/01/17 1445   04/01/17 1215  vancomycin (VANCOCIN) IVPB 1000 mg/200 mL premix     1,000 mg 200 mL/hr over 60 Minutes Intravenous  Once 04/01/17 1208 04/01/17 1603     Subjective: Seen and examined and states she slept ok. States leg hurts whenever it is picked up. No CP or SOB. Initially declined surgical debridement but wants to speak with Plastic Surgery again.    Objective: Vitals:   04/04/17 2210 04/04/17 2213 04/05/17 0018 04/05/17 0629  BP: (!) 93/59 95/60 (!) 93/58 (!) 83/57  Pulse: (!) 102  100 85  Resp:      Temp: 98.4 F (36.9 C)  98.3 F (36.8 C) 98.3 F (36.8 C)  TempSrc: Oral  Oral Oral  SpO2: 95%  92% 90%  Weight:      Height:        Intake/Output Summary (Last 24 hours) at 04/05/2017 1229 Last data filed at 04/05/2017 0040 Gross per 24 hour  Intake 300 ml  Output 300 ml  Net 0 ml   Filed Weights   04/01/17 1128  Weight: 43.1 kg (95 lb)    Examination: Physical Exam:  Constitutional: Thin Cachetic appearing elderly Caucasian female in NAD Eyes: Sclerae anicteric. Lids normal ENMT: External Ears and nose appear normal Neck: Appears normal. Supple Respiratory: Diminished but clear to Ausculation. Unlabored breathing Cardiovascular: RRR; 2/6 Systolic Murmur. 1+ LE edema  Abdomen: Soft, NT, ND. Bowel Sounds present Musculoskeletal: No contractures; No cyanosis Skin: Has a large necrotic ulcer on the Left anterior shin with congealed blood; No appreciable maggots/bugs today. Has Lower Extremity edema on the left leg and foot. Has some brusing on right Neurologic: CN 2-12 grossly intact. No appreciable focal deficits Psychiatric: Normal mood and affect. Intact judgement and insight.   Data Reviewed: I have personally reviewed following labs and imaging studies  CBC: Recent Labs  Lab 04/01/17 1242 04/02/17 0258 04/03/17 0420 04/04/17  0603  WBC 13.7* 11.9* 18.6* 21.4*  NEUTROABS 12.1*  --  16.4* 19.0*  HGB 17.1* 13.9 13.9 12.7  HCT 52.6* 44.5 43.6 39.6  MCV 99.2 98.7 98.6 96.6  PLT 314 344 332 677   Basic Metabolic Panel: Recent Labs  Lab 04/01/17 1245 04/02/17 0258 04/03/17 0420 04/04/17 0603  NA 142 142 138 135  K 3.8 3.5 3.6 3.7  CL 95* 101 97* 95*  CO2 34* 34* 33* 33*  GLUCOSE 118* 86 81 69  BUN 58* 41* 31* 24*  CREATININE 0.91 0.73 0.87 0.79  CALCIUM 9.2 8.2* 8.3* 8.4*  MG  --   --  1.8 1.7  PHOS  --   --  2.6 2.6   GFR: Estimated Creatinine Clearance: 37.5 mL/min (by C-G formula based on SCr of 0.79 mg/dL). Liver Function Tests: Recent Labs  Lab 04/03/17 0420 04/04/17 0603  AST 28 19  ALT 18 15  ALKPHOS 177* 159*  BILITOT 1.0 1.5*  PROT 5.4* 4.8*  ALBUMIN 2.0* 1.8*   No results for input(s): LIPASE, AMYLASE in the last 168 hours. No results for input(s): AMMONIA in the last 168 hours. Coagulation Profile: No results for input(s): INR, PROTIME in the last 168 hours. Cardiac  Enzymes: No results for input(s): CKTOTAL, CKMB, CKMBINDEX, TROPONINI in the last 168 hours. BNP (last 3 results) No results for input(s): PROBNP in the last 8760 hours. HbA1C: No results for input(s): HGBA1C in the last 72 hours. CBG: Recent Labs  Lab 04/04/17 1202 04/04/17 1607 04/04/17 2048 04/05/17 0624 04/05/17 1143  GLUCAP 104* 143* 111* 96 198*   Lipid Profile: No results for input(s): CHOL, HDL, LDLCALC, TRIG, CHOLHDL, LDLDIRECT in the last 72 hours. Thyroid Function Tests: Recent Labs    04/04/17 2248  TSH 4.300   Anemia Panel: Recent Labs    04/04/17 2248  VITAMINB12 484   Sepsis Labs: Recent Labs  Lab 04/01/17 1952 04/02/17 0258 04/04/17 2222 04/05/17 0147  LATICACIDVEN 2.3* 1.3 1.2 0.9    Recent Results (from the past 240 hour(s))  Blood Culture (routine x 2)     Status: None (Preliminary result)   Collection Time: 04/01/17 12:39 PM  Result Value Ref Range Status   Specimen Description BLOOD RIGHT ANTECUBITAL  Final   Special Requests IN PEDIATRIC BOTTLE Blood Culture adequate volume  Final   Culture NO GROWTH 3 DAYS  Final   Report Status PENDING  Incomplete  Blood Culture (routine x 2)     Status: None (Preliminary result)   Collection Time: 04/01/17 12:45 PM  Result Value Ref Range Status   Specimen Description BLOOD LEFT ANTECUBITAL  Final   Special Requests IN PEDIATRIC BOTTLE Blood Culture adequate volume  Final   Culture NO GROWTH 3 DAYS  Final   Report Status PENDING  Incomplete    Radiology Studies: Ct Head Wo Contrast  Result Date: 04/05/2017 CLINICAL DATA:  Sudden onset of confusion and neck pain EXAM: CT HEAD WITHOUT CONTRAST CT CERVICAL SPINE WITHOUT CONTRAST TECHNIQUE: Multidetector CT imaging of the head and cervical spine was performed following the standard protocol without intravenous contrast. Multiplanar CT image reconstructions of the cervical spine were also generated. COMPARISON:  None. FINDINGS: CT HEAD FINDINGS Brain: No  acute territorial infarction, hemorrhage, on or intracranial mass is visualized. Moderate small vessel ischemic changes of the white matter. Moderate atrophy. Nonenlarged ventricles. Vascular: No hyperdense vessels. Vertebral artery and carotid artery calcification Skull: No fracture.  Small osteoma frontal bone. Sinuses/Orbits: No acute  abnormality. Other: None CT CERVICAL SPINE FINDINGS Alignment: Trace anterolisthesis of C3 on C4 and C4 on C5. Straightening of the cervical spine. Facet alignment within normal limits Skull base and vertebrae: No acute fracture. No primary bone lesion or focal pathologic process. Soft tissues and spinal canal: No prevertebral fluid or swelling. No visible canal hematoma. Disc levels: Advanced arthritis at C5-C6. Moderate severe degenerative changes at C4-C5 and C6-C7. Upper chest: Apical emphysema. Probable subcentimeter hypodense nodule in the right lobe of thyroid Other: None IMPRESSION: 1. No CT evidence for acute intracranial abnormality. Atrophy and small vessel ischemic changes of the white matter 2. Trace anterolisthesis of C3 on C4. Degenerative changes most marked at C5-C6. No acute osseous abnormality 3. Apical emphysema Electronically Signed   By: Donavan Foil M.D.   On: 04/05/2017 02:35   Ct Cervical Spine Wo Contrast  Result Date: 04/05/2017 CLINICAL DATA:  Sudden onset of confusion and neck pain EXAM: CT HEAD WITHOUT CONTRAST CT CERVICAL SPINE WITHOUT CONTRAST TECHNIQUE: Multidetector CT imaging of the head and cervical spine was performed following the standard protocol without intravenous contrast. Multiplanar CT image reconstructions of the cervical spine were also generated. COMPARISON:  None. FINDINGS: CT HEAD FINDINGS Brain: No acute territorial infarction, hemorrhage, on or intracranial mass is visualized. Moderate small vessel ischemic changes of the white matter. Moderate atrophy. Nonenlarged ventricles. Vascular: No hyperdense vessels. Vertebral artery  and carotid artery calcification Skull: No fracture.  Small osteoma frontal bone. Sinuses/Orbits: No acute abnormality. Other: None CT CERVICAL SPINE FINDINGS Alignment: Trace anterolisthesis of C3 on C4 and C4 on C5. Straightening of the cervical spine. Facet alignment within normal limits Skull base and vertebrae: No acute fracture. No primary bone lesion or focal pathologic process. Soft tissues and spinal canal: No prevertebral fluid or swelling. No visible canal hematoma. Disc levels: Advanced arthritis at C5-C6. Moderate severe degenerative changes at C4-C5 and C6-C7. Upper chest: Apical emphysema. Probable subcentimeter hypodense nodule in the right lobe of thyroid Other: None IMPRESSION: 1. No CT evidence for acute intracranial abnormality. Atrophy and small vessel ischemic changes of the white matter 2. Trace anterolisthesis of C3 on C4. Degenerative changes most marked at C5-C6. No acute osseous abnormality 3. Apical emphysema Electronically Signed   By: Donavan Foil M.D.   On: 04/05/2017 02:35   Scheduled Meds: . allopurinol  200 mg Oral Daily  . collagenase   Topical Daily  . enoxaparin (LOVENOX) injection  30 mg Subcutaneous Q24H  . feeding supplement (ENSURE ENLIVE)  237 mL Oral BID BM  . insulin aspart  0-5 Units Subcutaneous QHS  . insulin aspart  0-9 Units Subcutaneous TID WC  . metroNIDAZOLE  500 mg Oral Q8H  . mometasone-formoterol  2 puff Inhalation BID  . multivitamin with minerals  1 tablet Oral Daily  . polyvinyl alcohol  1 drop Both Eyes BID  . sildenafil  20 mg Oral TID   Continuous Infusions: . aztreonam Stopped (04/05/17 3254)  . vancomycin Stopped (04/04/17 1742)    LOS: 4 days   Bonnell Public, MD [facility] Pager [phone]  If 7PM-7AM, please contact night-coverage [email or web address] Password [facility] 04/05/2017, 12:29 PM

## 2017-04-05 NOTE — Clinical Social Work Placement (Signed)
   CLINICAL SOCIAL WORK PLACEMENT  NOTE  Date:  04/05/2017  Patient Details  Name: Aamina Skiff MRN: [id number] Date of Birth: 1933/12/31  Clinical Social Work is seeking post-discharge placement for this patient at the [place] level of care (*CSW will initial, date and re-position this form in  chart as items are completed):  Yes   Patient/family provided with [place] Work Department's list of facilities offering this level of care within the geographic area requested by the patient (or if unable, by the patient's family).  Yes   Patient/family informed of their freedom to choose among providers that offer the needed level of care, that participate in Medicare, Medicaid or managed care program needed by the patient, have an available bed and are willing to accept the patient.  Yes   Patient/family informed of [facility]'s ownership interest in [facility] and [facility], as well as of the fact that they are under no obligation to receive care at these facilities.  PASRR submitted to EDS on 04/05/17     PASRR number received on 04/05/17     Existing PASRR number confirmed on       FL2 transmitted to all facilities in geographic area requested by pt/family on 04/05/17     FL2 transmitted to all facilities within larger geographic area on       Patient informed that his/her managed care company has contracts with or will negotiate with certain facilities, including the following:            Patient/family informed of bed offers received.  Patient chooses bed at       Physician recommends and patient chooses bed at      Patient to be transferred to   on  .  Patient to be transferred to facility by       Patient family notified on   of transfer.  Name of family member notified:        PHYSICIAN Please sign FL2     Additional Comment:    _______________________________________________ Candie Chroman, LCSW 04/05/2017,  2:41 PM

## 2017-04-05 NOTE — Progress Notes (Signed)
RN noted new onset of confusion:  patient stated that "everything is upside down in my room and I am on the floor".  Patient also drifting more towards her  right side. Patient's lungs sounds clear and diminished. Complaining of neck pain.  Rapid response notified for assessment and evaluation. On call MD also notified. New orders in [facility]. Will continue to monitor.

## 2017-04-05 NOTE — Progress Notes (Signed)
Pharmacy Antibiotic Note Floreen Teegarden is a 83 y.o. female admitted on 04/01/2017 with cellulitis and LLE wound that may require debridement by plastic sx. Currently on day 5 Azactam, Flagyl and vancomycin for treatment.   Vancomycin trough this is evening is 13. Scr from 12/1 stable.   Plan: 1. Increase vancomycin to 1 gram IV every 24 hours  2. Continue Azactam and Flagyl at current doses 3. SCr every 72 hours while on vancomycin   Height: 5' (152.4 cm) Weight: 95 lb (43.1 kg) IBW/kg (Calculated) : 45.5  Temp (24hrs), Avg:98.3 F (36.8 C), Min:98.3 F (36.8 C), Max:98.4 F (36.9 C)  Recent Labs  Lab 04/01/17 1242 04/01/17 1245 04/01/17 1250 04/01/17 1952 04/02/17 0258 04/03/17 0420 04/04/17 0603 04/04/17 2222 04/05/17 0147 04/05/17 1533  WBC 13.7*  --   --   --  11.9* 18.6* 21.4*  --   --   --   CREATININE  --  0.91  --   --  0.73 0.87 0.79  --   --   --   LATICACIDVEN  --   --  3.92* 2.3* 1.3  --   --  1.2 0.9  --   VANCOTROUGH  --   --   --   --   --   --   --   --   --  13*    Estimated Creatinine Clearance: 37.5 mL/min (by C-G formula based on SCr of 0.79 mg/dL).    Allergies  Allergen Reactions  . Penicillins Swelling and Rash    Site of swelling (?) Has patient had a PCN reaction causing immediate rash, facial/tongue/throat swelling, SOB or lightheadedness with hypotension: Yes Has patient had a PCN reaction causing severe rash involving mucus membranes or skin necrosis: No Has patient had a PCN reaction that required hospitalization: No Has patient had a PCN reaction occurring within the last 10 years: No If all of the above answers are "NO", then may proceed with Cephalosporin use.   Marland Kitchen Lisinopril-Hydrochlorothiazide Other (See Comments) and Cough    Pt's PCP took her off medication because of possible kidney damage  . Sulfa Antibiotics Other (See Comments)    Unknown reaction-- childhood allergy   . Latex Rash    Antimicrobials this  admission: Vancomycin 11/28>> Flagyl 11/28>> Azactam 11/28>>  Microbiology results: 11/28 BCx: NGTD  Thank you for allowing pharmacy to be a part of this patient's care.  Vincenza Hews, PharmD, BCPS 04/05/2017, 5:23 PM
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
## 2017-04-06 LAB — CBC WITH DIFFERENTIAL/PLATELET
Basophils Absolute: 0 10*3/uL (ref 0.0–0.1)
Basophils Relative: 0 %
Eosinophils Absolute: 0 10*3/uL (ref 0.0–0.7)
Eosinophils Relative: 0 %
HCT: 40 % (ref 36.0–46.0)
Hemoglobin: 12.8 g/dL (ref 12.0–15.0)
Lymphocytes Relative: 7 %
Lymphs Abs: 1.1 10*3/uL (ref 0.7–4.0)
MCH: 31.2 pg (ref 26.0–34.0)
MCHC: 32 g/dL (ref 30.0–36.0)
MCV: 97.6 fL (ref 78.0–100.0)
Monocytes Absolute: 0.8 10*3/uL (ref 0.1–1.0)
Monocytes Relative: 5 %
Neutro Abs: 13.9 10*3/uL — ABNORMAL HIGH (ref 1.7–7.7)
Neutrophils Relative %: 88 %
Platelets: 335 10*3/uL (ref 150–400)
RBC: 4.1 MIL/uL (ref 3.87–5.11)
RDW: 16.1 % — ABNORMAL HIGH (ref 11.5–15.5)
WBC: 15.8 10*3/uL — ABNORMAL HIGH (ref 4.0–10.5)

## 2017-04-06 LAB — RENAL FUNCTION PANEL
Albumin: 1.6 g/dL — ABNORMAL LOW (ref 3.5–5.0)
Anion gap: 5 (ref 5–15)
BUN: 40 mg/dL — ABNORMAL HIGH (ref 6–20)
CO2: 35 mmol/L — ABNORMAL HIGH (ref 22–32)
Calcium: 8.7 mg/dL — ABNORMAL LOW (ref 8.9–10.3)
Chloride: 94 mmol/L — ABNORMAL LOW (ref 101–111)
Creatinine, Ser: 0.73 mg/dL (ref 0.44–1.00)
GFR calc Af Amer: >60 mL/min (ref >60)
GFR calc non Af Amer: >60 mL/min (ref >60)
Glucose, Bld: 122 mg/dL — ABNORMAL HIGH (ref 65–99)
Phosphorus: 2.3 mg/dL — ABNORMAL LOW (ref 2.5–4.6)
Potassium: 4.4 mmol/L (ref 3.5–5.1)
Sodium: 134 mmol/L — ABNORMAL LOW (ref 135–145)

## 2017-04-06 LAB — CULTURE, BLOOD (ROUTINE X 2)
CULTURE: NO GROWTH
CULTURE: NO GROWTH
SPECIAL REQUESTS: ADEQUATE
Special Requests: ADEQUATE

## 2017-04-06 LAB — FOLATE RBC
Folate, Hemolysate: >620 ng/mL
Hematocrit: 39.6 % (ref 34.0–46.6)

## 2017-04-06 LAB — GLUCOSE, CAPILLARY
GLUCOSE-CAPILLARY: 105 mg/dL — AB (ref 65–99)
Glucose-Capillary: 110 mg/dL — ABNORMAL HIGH (ref 65–99)
Glucose-Capillary: 127 mg/dL — ABNORMAL HIGH (ref 65–99)
Glucose-Capillary: 78 mg/dL (ref 65–99)
Glucose-Capillary: 159 mg/dL — ABNORMAL HIGH (ref 65–99)

## 2017-04-06 MED ORDER — K PHOS MONO-SOD PHOS DI & MONO 155-852-130 MG PO TABS
500.0000 mg | ORAL_TABLET | Freq: Three times a day (TID) | ORAL | Status: AC
  Administered 2017-04-06 – 2017-04-07 (×4): 500 mg via ORAL
  Filled 2017-04-06 (×4): qty 2

## 2017-04-06 MED ORDER — POTASSIUM PHOSPHATE MONOBASIC 500 MG PO TABS: 500.0000 mg | ORAL_TABLET | Freq: Three times a day (TID) | ORAL | Status: DC

## 2017-04-06 MED ORDER — POTASSIUM PHOSPHATE MONOBASIC 500 MG PO TABS
500.0000 mg | ORAL_TABLET | Freq: Three times a day (TID) | ORAL | Status: DC
  Filled 2017-04-06 (×2): qty 1

## 2017-04-06 NOTE — Care Management Important Message (Signed)
Important Message  Patient Details  Name: Angel Estrada MRN: [id number] Date of Birth: 11/23/1933   Medicare Important Message Given:       Orbie Pyo 04/06/2017, 2:05 PM

## 2017-04-06 NOTE — Progress Notes (Signed)
Physical Therapy Wound Treatment Patient Details  Name: Angel Estrada MRN: [id number] Date of Birth: 1933/11/11  Today's Date: 04/06/2017 Time: 6010-9323 Time Calculation (min): 25 min  Subjective  Subjective: Pt stating she may have surgery tomorrow. Patient and Family Stated Goals: To heal wound Date of Onset: (unsure) Prior Treatments: During prior admission with hematoma  Pain Score: Slight grimacing with pulsatile lavage. No pain after wound redressed. Wound Assessment  Wound / Incision (Open or Dehisced) 04/04/17 Diabetic ulcer Leg Left;Lower (Active)  Dressing Type Gauze (Comment);ABD;Compression wrap;Moist to dry 04/06/2017 11:00 AM  Dressing Changed Changed 04/06/2017 11:00 AM  Dressing Status Clean;Dry;Intact 04/06/2017 11:00 AM  Dressing Change Frequency Daily 04/06/2017 11:00 AM  Site / Wound Assessment Black;Red;Yellow 04/06/2017 11:00 AM  % Wound base Red or Granulating 40% 04/06/2017 11:00 AM  % Wound base Yellow/Fibrinous Exudate 5% 04/06/2017 11:00 AM  % Wound base Black/Eschar 55% 04/06/2017 11:00 AM  Peri-wound Assessment Edema;Purple 04/06/2017 11:00 AM  Wound Length (cm) 5 cm 04/04/2017  9:48 AM  Wound Width (cm) 5 cm 04/04/2017  9:48 AM  Wound Depth (cm) 0.5 cm 04/04/2017  9:48 AM  Wound Volume (cm^3) 12.5 cm^3 04/04/2017  9:48 AM  Wound Surface Area (cm^2) 25 cm^2 04/04/2017  9:48 AM  Undermining (cm) 0.5 cm in depth located at 11:00 on clock. 04/04/2017  9:48 AM  Margins Unattached edges (unapproximated) 04/06/2017 11:00 AM  Closure None 04/06/2017 11:00 AM  Drainage Amount Minimal 04/06/2017 11:00 AM  Drainage Description Sanguineous;No odor 04/06/2017 11:00 AM  Treatment Debridement (Selective);Hydrotherapy (Pulse lavage);Packing (Saline gauze) 04/06/2017 11:00 AM  Santyl applied to wound bed prior to applying dressing.  Hydrotherapy Pulsed lavage therapy - wound location: LLE, Anterior lower leg Pulsed Lavage with Suction (psi): 8 psi Pulsed Lavage with Suction -  Normal Saline Used: 1000 mL Pulsed Lavage Tip: Tip with splash shield Selective Debridement Selective Debridement - Location: LLE, anterior lower leg Selective Debridement - Tools Used: Forceps;Scissors Selective Debridement - Tissue Removed: black and dark red dried/clotted blood   Wound Assessment and Plan  Wound Therapy - Assess/Plan/Recommendations Wound Therapy - Clinical Statement: Slow progress with removal of necrotic tissue. Pt with some confusion.  Wound Therapy - Functional Problem List: Decreased mobility due to wound/pain Factors Delaying/Impairing Wound Healing: Diabetes Mellitus;Infection - systemic/local;Multiple medical problems Hydrotherapy Plan: Debridement;Dressing change;Patient/family education;Pulsatile lavage with suction Wound Therapy - Frequency: 6X / week Wound Therapy - Current Recommendations: Nutritionist Wound Therapy - Follow Up Recommendations: Skilled nursing facility Wound Plan: as above  Wound Therapy Goals- Improve the function of patient's integumentary system by progressing the wound(s) through the phases of wound healing (inflammation - proliferation - remodeling) by: Decrease Necrotic Tissue to: 75 Decrease Necrotic Tissue - Progress: Progressing toward goal Increase Granulation Tissue to: 25 Increase Granulation Tissue - Progress: Progressing toward goal Improve Drainage Characteristics: Serous Improve Drainage Characteristics - Progress: Progressing toward goal Patient/Family will be able to : Patient will understand reason for and importance of keeping wound clean. Patient/Family Instruction Goal - Progress: Progressing toward goal  Goals will be updated until maximal potential achieved or discharge criteria met.  Discharge criteria: when goals achieved, discharge from hospital, MD decision/surgical intervention, no progress towards goals, refusal/missing three consecutive treatments without notification or medical reason.  GP     Shary Decamp  [place] 04/06/2017, 12:40 PM [employer] PT [phone]

## 2017-04-06 NOTE — Social Work (Signed)
CSW called Claiborne Billings at [facility] and she confirmed that they can offer a bed.   CSW met with patient at bedside and discussed bed offer from SNF[facility]. Pt in agreement and will go to [facility] for short term rehab. Pt indicated that she has a surgery consult pending.  CSW will continue to follow for disposition.  Elissa Hefty, LCSW Clinical Social Worker [phone]

## 2017-04-06 NOTE — Progress Notes (Signed)
  PROGRESS NOTE  [name] CSP:[id number] DOB: [birth year] DOA: 04/01/2017 PCP: Jani Gravel, MD  Brief Narrative: 83yow presented with left leg wound found to be infested with insects and contaminated with cat hair. Admitted for sepsis secondary to LLE wound cellulitis complicated by insect infestation.  Assessment/Plan LLE cellulitis, complicated wound with insect infestation, contaminated by cat hair. - afebrile, WBC trending down - continue abx. BC pending. No wound culture taken - await plastic surgery reeval today  COPD - appears stable. Continue albuterol, Dulera.  Pulmonary HTN  - appears stable, continue sildenafil  Chronic diastolic CHF - appears stable. Resume diuretic if indicated.   Severe malnutrition - continue Ensure   Replace phosphrous  DVT prophylaxis: enoxaparin Code Status: full Family Communication: none Disposition Plan: SNF    Murray Hodgkins, MD  [facility] Direct contact: [phone] --Via amion app OR  --[email or web address]; password [email or web address]  7PM-7AM contact night coverage as above 04/06/2017, 5:55 PM  LOS: 5 days   Consultants:  Plastic surgery  Procedures:    Antimicrobials:  Aztreonam 11/28 >>  Metronidazole 11/28 >>  Vancomycin 11/28 >>  Interval history/Subjective: Feels ok.   Objective: Vitals:  Vitals:   04/05/17 2131 04/06/17 1513  BP: 103/75 (!) 92/56  Pulse: 86 (!) 103  Resp: 18   Temp: 98.3 F (36.8 C) 99.2 F (37.3 C)  SpO2: 97% (!) 82%    Exam:  Constitutional:  . Appears calm and comfortable Respiratory:  . CTA bilaterally, no w/r/r.  . Respiratory effort normal.  Cardiovascular:  . RRR, no m/r/g . No LLE extremity edema   Psychiatric:  . Mental status o Mood appears normal, affect odd Appears confused . judgement and insight appear impaired   I have personally reviewed the following:   Labs:  CBG stable  BMP noted  Phosphorus 2.3  WBC 21.4 >> 15.8   Scheduled Meds: .  allopurinol  200 mg Oral Daily  . collagenase   Topical Daily  . enoxaparin (LOVENOX) injection  30 mg Subcutaneous Q24H  . feeding supplement (ENSURE ENLIVE)  237 mL Oral BID BM  . insulin aspart  0-5 Units Subcutaneous QHS  . insulin aspart  0-9 Units Subcutaneous TID WC  . metroNIDAZOLE  500 mg Oral Q8H  . mometasone-formoterol  2 puff Inhalation BID  . multivitamin with minerals  1 tablet Oral Daily  . polyvinyl alcohol  1 drop Both Eyes BID  . sildenafil  20 mg Oral TID   Continuous Infusions: . aztreonam 1 g (04/06/17 1702)  . vancomycin Stopped (04/05/17 1900)    Active Problems:   Hypertension   COPD (chronic obstructive pulmonary disease) (HCC)   CHF (congestive heart failure) (HCC)   Diabetes mellitus ([place])   Pulmonary hypertension (HCC)   Sepsis affecting skin (HCC)   Infestation by fly larvae   LOS: 5 days

## 2017-04-06 NOTE — Progress Notes (Signed)
Order to discontinue telemetry was placed on 04/06/17 at [facility] from Dr. Eulogio Bear.  Patient was taken off of telemetry.

## 2017-04-07 ENCOUNTER — Ambulatory Visit: Payer: Self-pay | Admitting: Plastic Surgery

## 2017-04-07 DIAGNOSIS — S81802A Unspecified open wound, left lower leg, initial encounter: Secondary | ICD-10-CM

## 2017-04-07 DIAGNOSIS — E43 Unspecified severe protein-calorie malnutrition: Secondary | ICD-10-CM

## 2017-04-07 DIAGNOSIS — I5032 Chronic diastolic (congestive) heart failure: Secondary | ICD-10-CM

## 2017-04-07 DIAGNOSIS — L8990 Pressure ulcer of unspecified site, unspecified stage: Secondary | ICD-10-CM

## 2017-04-07 DIAGNOSIS — L03116 Cellulitis of left lower limb: Secondary | ICD-10-CM

## 2017-04-07 LAB — CBC
HCT: 40.1 % (ref 36.0–46.0)
Hemoglobin: 12.7 g/dL (ref 12.0–15.0)
MCH: 31 pg (ref 26.0–34.0)
MCHC: 31.7 g/dL (ref 30.0–36.0)
MCV: 97.8 fL (ref 78.0–100.0)
PLATELETS: 325 10*3/uL (ref 150–400)
RBC: 4.1 MIL/uL (ref 3.87–5.11)
RDW: 15.7 % — AB (ref 11.5–15.5)
WBC: 13.5 10*3/uL — AB (ref 4.0–10.5)

## 2017-04-07 LAB — GLUCOSE, CAPILLARY
GLUCOSE-CAPILLARY: 103 mg/dL — AB (ref 65–99)
GLUCOSE-CAPILLARY: 165 mg/dL — AB (ref 65–99)
GLUCOSE-CAPILLARY: 159 mg/dL — AB (ref 65–99)
GLUCOSE-CAPILLARY: 130 mg/dL — AB (ref 65–99)
Glucose-Capillary: 63 mg/dL — ABNORMAL LOW (ref 65–99)

## 2017-04-07 NOTE — Consult Note (Addendum)
WOC follow-up: Pt plans to go to the OR with plastic surgery team tomorrow, according to primary team progress notes.  Please refer to their team for further questions regarding leg wound plan of care. Please re-consult if further assistance is needed.  Thank-you,  Julien Girt MSN, [place], [place], [place], [place]

## 2017-04-07 NOTE — Progress Notes (Addendum)
  PROGRESS NOTE  Angel Estrada XIH:[id number] DOB: July 03, 1933 DOA: 04/01/2017 PCP: Jani Gravel, MD  Brief Narrative: 83yow presented with left leg wound found to be infested with insects and contaminated with cat hair. Admitted for sepsis secondary to LLE wound cellulitis complicated by insect infestation.  Assessment/Plan LLE cellulitis, complicated wound with insect infestation, contaminated by cat hair. - afebrile, WBC continues to trend down - continue abx 12/4. BC pending. No wound culture taken - discussed with Plastic surgery, plan for OR 12/5.  COPD - appears stable 12/4. Continue albuterol, Dulera 12/4  Pulmonary HTN  - appears stable, continue sildenafil 34/9  Chronic diastolic CHF - appears stable 12/4. Resume diuretic when indicated.   Severe malnutrition - continue Ensure   DVT prophylaxis: enoxaparin Code Status: full Family Communication: none Disposition Plan: SNF    Murray Hodgkins, MD  [facility] Direct contact: [phone] --Via amion app OR  --[email or web address]; password [email or web address]  7PM-7AM contact night coverage as above 04/07/2017, 11:36 AM  LOS: 6 days   Consultants:  Plastic surgery  Procedures:    Antimicrobials:  Aztreonam 11/28 >>  Metronidazole 11/28 >>  Vancomycin 11/28 >>  Interval history/Subjective: No complaints, no pain.    Objective: Vitals:  Vitals:   04/06/17 2124 04/07/17 0700  BP: 94/62 90/65  Pulse: (!) 102 77  Resp: 16 16  Temp: 99.2 F (37.3 C) 97.6 F (36.4 C)  SpO2: 91% 98%    Exam:  Constitutional:   . Appears calm and comfortable Respiratory:  . CTA bilaterally, no w/r/r.  . Respiratory effort normal.  Cardiovascular:  . RRR, no m/r/g . No LE extremity edema    Skin: LLE dressed.   I have personally reviewed the following:   Labs:  CBG stable  WBC 21.4 >> 15.8 >> 13.5   Scheduled Meds: . allopurinol  200 mg Oral Daily  . collagenase   Topical Daily  . feeding supplement  (ENSURE ENLIVE)  237 mL Oral BID BM  . insulin aspart  0-5 Units Subcutaneous QHS  . insulin aspart  0-9 Units Subcutaneous TID WC  . metroNIDAZOLE  500 mg Oral Q8H  . mometasone-formoterol  2 puff Inhalation BID  . multivitamin with minerals  1 tablet Oral Daily  . phosphorus  500 mg Oral TID WC & HS  . polyvinyl alcohol  1 drop Both Eyes BID  . sildenafil  20 mg Oral TID   Continuous Infusions: . aztreonam 1 g (04/07/17 0621)  . vancomycin 1,000 mg (04/06/17 1759)    Principal Problem:   Left leg cellulitis Active Problems:   Hypertension   COPD (chronic obstructive pulmonary disease) (HCC)   Diabetes mellitus ([place])   Pulmonary hypertension (HCC)   Infestation by fly larvae   Pressure injury of skin   Chronic diastolic CHF (congestive heart failure) (HCC)   Severe malnutrition ([place])   LOS: 6 days

## 2017-04-07 NOTE — Progress Notes (Signed)
Physical Therapy Wound Treatment Patient Details  Name: Angel Estrada MRN: [id number] Date of Birth: Mar 02, 1934  Today's Date: 04/07/2017 Time: 9179-1505 Time Calculation (min): 32 min  Subjective  Subjective: Pt stating she may have surgery tomorrow. Patient and Family Stated Goals: To heal wound Date of Onset: (unsure) Prior Treatments: During prior admission with hematoma  Pain Score: Pain Score: 4/10  Moaning and grimacing.  Pain meds given prior to treatment   Wound Assessment  Wound / Incision (Open or Dehisced) 04/04/17 Diabetic ulcer Leg Left;Lower (Active)  Dressing Type Gauze (Comment);ABD;Compression wrap;Moist to dry 04/07/2017  2:05 PM  Dressing Changed Changed 04/06/2017 11:00 AM  Dressing Status Clean;Dry;Intact 04/07/2017  2:05 PM  Dressing Change Frequency Daily 04/07/2017  2:05 PM  Site / Wound Assessment Black;Red;Yellow 04/07/2017  2:05 PM  % Wound base Red or Granulating 40% 04/07/2017  2:05 PM  % Wound base Yellow/Fibrinous Exudate 5% 04/07/2017  2:05 PM  % Wound base Black/Eschar 55% 04/07/2017  2:05 PM  Peri-wound Assessment Edema;Purple 04/07/2017  2:05 PM  Wound Length (cm) 5 cm 04/04/2017  9:48 AM  Wound Width (cm) 5 cm 04/04/2017  9:48 AM  Wound Depth (cm) 0.5 cm 04/04/2017  9:48 AM  Wound Volume (cm^3) 12.5 cm^3 04/04/2017  9:48 AM  Wound Surface Area (cm^2) 25 cm^2 04/04/2017  9:48 AM  Undermining (cm) 0.5 cm in depth located at 11:00 on clock. 04/04/2017  9:48 AM  Margins Unattached edges (unapproximated) 04/07/2017  2:05 PM  Closure None 04/07/2017  2:05 PM  Drainage Amount Minimal 04/07/2017  2:05 PM  Drainage Description Sanguineous;No odor 04/07/2017  2:05 PM  Treatment Cleansed;Debridement (Selective);Hydrotherapy (Pulse lavage);Packing (Saline gauze) 04/07/2017  2:05 PM   Hydrotherapy Pulsed lavage therapy - wound location: LLE, Anterior lower leg Pulsed Lavage with Suction (psi): 8 psi Pulsed Lavage with Suction - Normal Saline Used: 1000 mL Pulsed Lavage  Tip: Tip with splash shield Selective Debridement Selective Debridement - Location: LLE, anterior lower leg Selective Debridement - Tools Used: Forceps;Scissors;Scalpel Selective Debridement - Tissue Removed: black and dark red dried/clotted blood   Wound Assessment and Plan  Wound Therapy - Assess/Plan/Recommendations Wound Therapy - Clinical Statement: Slow progress with removal of necrotic tissue. Pt with some confusion.  Wound Therapy - Functional Problem List: Decreased mobility due to wound/pain Factors Delaying/Impairing Wound Healing: Diabetes Mellitus;Infection - systemic/local;Multiple medical problems Hydrotherapy Plan: Debridement;Dressing change;Patient/family education;Pulsatile lavage with suction Wound Therapy - Frequency: 6X / week Wound Therapy - Current Recommendations: Nutritionist Wound Therapy - Follow Up Recommendations: Skilled nursing facility Wound Plan: as above  Wound Therapy Goals- Improve the function of patient's integumentary system by progressing the wound(s) through the phases of wound healing (inflammation - proliferation - remodeling) by: Decrease Necrotic Tissue to: 75 Decrease Necrotic Tissue - Progress: Progressing toward goal Increase Granulation Tissue to: 25 Increase Granulation Tissue - Progress: Progressing toward goal Improve Drainage Characteristics: Serous Improve Drainage Characteristics - Progress: Progressing toward goal Patient/Family will be able to : Patient will understand reason for and importance of keeping wound clean. Patient/Family Instruction Goal - Progress: Progressing toward goal Goals/treatment plan/discharge plan were made with and agreed upon by patient/family: Yes Time For Goal Achievement: 7 days Wound Therapy - Potential for Goals: Good  Goals will be updated until maximal potential achieved or discharge criteria met.  Discharge criteria: when goals achieved, discharge from hospital, MD decision/surgical intervention,  no progress towards goals, refusal/missing three consecutive treatments without notification or medical reason.  GP     Angel Estrada  Angel Estrada 04/07/2017, 2:07 PM 04/07/2017  Donnella Sham, PT [phone]  (pager)

## 2017-04-07 NOTE — Progress Notes (Signed)
Physical Therapy Treatment Patient Details Name: Angel Estrada MRN: [id number] DOB: Feb 28, 1934 Today's Date: 04/07/2017    History of Present Illness Angel Estrada is a 83 y.o. female with medical history significant of DM, pHTN, depression, and recent LE wound.  She was discharged on 11/23 with the diagnosis of left leg hematoma and surrounding infection.  She was treated with IV vanc and then transitioned to doxycycline which she finished.  initially she was improved but then began to worsen. Septic in ED    PT Comments    Continuing work on functional mobility and activity tolerance;  +2 assist to transfer to recliner; Angel Estrada did cry out during transfer -- after transfer she indicated that it wasn't super painful, and that she cried out because she was scared; this is promising: perhaps she will tolerate standing and maybe even taking a few steps with RW next session   Follow Up Recommendations  SNF     Equipment Recommendations  Other (comment)(tbd at SNF)    Recommendations for Other Services       Precautions / Restrictions Precautions Precautions: Fall Precaution Comments: Painful LLE    Mobility  Bed Mobility Overal bed mobility: Needs Assistance Bed Mobility: Supine to Sit     Supine to sit: Mod assist     General bed mobility comments: verbal cues for sequencing and use of bed rail; mod assist to elelvate trunk to sit; and use of bed pad to bring hips EOB  Transfers Overall transfer level: Needs assistance Equipment used: 2 person hand held assist(and gait belt) Transfers: Sit to/from [employer] Transfers Sit to Stand: Max assist Stand pivot transfers: +2 physical assistance;Max assist       General transfer comment: +2 Max assist to squat pivot transfer bed to recliner placed on her R side; Heavily reliant on holding to therapist and tech  Ambulation/Gait                 Stairs            Wheelchair Mobility    Modified  Rankin (Stroke Patients Only)       Balance     Sitting balance-Leahy Scale: Fair       Standing balance-Leahy Scale: Zero                              Cognition Arousal/Alertness: Awake/alert Behavior During Therapy: WFL for tasks assessed/performed Overall Cognitive Status: Within Functional Limits for tasks assessed                                 General Comments: generally disheveled appearance      Exercises      General Comments        Pertinent Vitals/Pain Pain Assessment: Faces Faces Pain Scale: Hurts little more Pain Location: LLE pain; Angel Estrada also reported her grimacing was more because she was scared of hurting Pain Descriptors / Indicators: Aching;Grimacing;Guarding;Moaning Pain Intervention(s): Monitored during session    Home Living                      Prior Function            PT Goals (current goals can now be found in the care plan section) Acute Rehab PT Goals Patient Stated Goal: to get better PT Goal Formulation: With patient Time For Goal Achievement:  04/18/17 Potential to Achieve Goals: Good Progress towards PT goals: Progressing toward goals    Frequency    Min 2X/week      PT Plan Current plan remains appropriate    Co-evaluation              AM-PAC PT "6 Clicks" Daily Activity  Outcome Measure  Difficulty turning over in bed (including adjusting bedclothes, sheets and blankets)?: Unable Difficulty moving from lying on back to sitting on the side of the bed? : Unable Difficulty sitting down on and standing up from a chair with arms (e.g., wheelchair, bedside commode, etc,.)?: Unable Help needed moving to and from a bed to chair (including a wheelchair)?: A Lot Help needed walking in hospital room?: Total Help needed climbing 3-5 steps with a railing? : Total 6 Click Score: 7    End of Session Equipment Utilized During Treatment: Gait belt Activity Tolerance: Patient  tolerated treatment well;Other (comment)(somewhat limited by anticipation of pain) Patient left: in chair;with call bell/phone within reach;with chair alarm set Nurse Communication: Mobility status PT Visit Diagnosis: Unsteadiness on feet (R26.81);History of falling (Z91.81);Pain Pain - Right/Left: Left Pain - part of body: Leg     Time: 1415-1430 PT Time Calculation (min) (ACUTE ONLY): 15 min  Charges:  $Therapeutic Activity: 8-22 mins                    G Codes:       Roney Marion, PT  Acute Rehabilitation Services Pager [phone] Office [phone]    Colletta [place] 04/07/2017, 4:11 PM

## 2017-04-08 ENCOUNTER — Inpatient Hospital Stay (HOSPITAL_COMMUNITY): Payer: Medicare Other | Admitting: Anesthesiology

## 2017-04-08 ENCOUNTER — Inpatient Hospital Stay (HOSPITAL_COMMUNITY): Payer: Medicare Other

## 2017-04-08 ENCOUNTER — Encounter (HOSPITAL_COMMUNITY): Payer: Self-pay | Admitting: Plastic Surgery

## 2017-04-08 ENCOUNTER — Encounter (HOSPITAL_COMMUNITY)
Admission: EM | Disposition: A | Discharge status: Skilled Nursing Facility | Payer: Self-pay | Source: Home / Self Care | Attending: Internal Medicine

## 2017-04-08 DIAGNOSIS — I5032 Chronic diastolic (congestive) heart failure: Secondary | ICD-10-CM

## 2017-04-08 DIAGNOSIS — J42 Unspecified chronic bronchitis: Secondary | ICD-10-CM

## 2017-04-08 HISTORY — PX: I & D EXTREMITY: SHX5045

## 2017-04-08 HISTORY — PX: APPLICATION OF A-CELL OF EXTREMITY: SHX6303

## 2017-04-08 HISTORY — PX: APPLICATION OF WOUND VAC: SHX5189

## 2017-04-08 LAB — CBC
HEMATOCRIT: 39.2 % (ref 36.0–46.0)
Hemoglobin: 12.7 g/dL (ref 12.0–15.0)
MCH: 31.3 pg (ref 26.0–34.0)
MCHC: 32.4 g/dL (ref 30.0–36.0)
MCV: 96.6 fL (ref 78.0–100.0)
Platelets: 376 10*3/uL (ref 150–400)
RBC: 4.06 MIL/uL (ref 3.87–5.11)
RDW: 15.5 % (ref 11.5–15.5)
WBC: 16.6 10*3/uL — AB (ref 4.0–10.5)

## 2017-04-08 LAB — BASIC METABOLIC PANEL
Anion gap: 8 (ref 5–15)
BUN: 43 mg/dL — AB (ref 6–20)
CHLORIDE: 91 mmol/L — AB (ref 101–111)
CO2: 35 mmol/L — AB (ref 22–32)
CREATININE: 0.7 mg/dL (ref 0.44–1.00)
Calcium: 8.1 mg/dL — ABNORMAL LOW (ref 8.9–10.3)
GFR calc non Af Amer: >60 mL/min (ref >60)
Glucose, Bld: 113 mg/dL — ABNORMAL HIGH (ref 65–99)
POTASSIUM: 4.4 mmol/L (ref 3.5–5.1)
SODIUM: 134 mmol/L — AB (ref 135–145)

## 2017-04-08 LAB — SURGICAL PCR SCREEN
MRSA, PCR: NEGATIVE
Staphylococcus aureus: NEGATIVE

## 2017-04-08 LAB — GLUCOSE, CAPILLARY
GLUCOSE-CAPILLARY: 105 mg/dL — AB (ref 65–99)
GLUCOSE-CAPILLARY: 120 mg/dL — AB (ref 65–99)
GLUCOSE-CAPILLARY: 105 mg/dL — AB (ref 65–99)
GLUCOSE-CAPILLARY: 160 mg/dL — AB (ref 65–99)
Glucose-Capillary: 86 mg/dL (ref 65–99)
Glucose-Capillary: 96 mg/dL (ref 65–99)

## 2017-04-08 LAB — BRAIN NATRIURETIC PEPTIDE: B Natriuretic Peptide: 409.5 pg/mL — ABNORMAL HIGH (ref 0.0–100.0)

## 2017-04-08 LAB — D-DIMER, QUANTITATIVE (NOT AT ARMC): D DIMER QUANT: 2.62 ug{FEU}/mL — AB (ref 0.00–0.50)

## 2017-04-08 SURGERY — IRRIGATION AND DEBRIDEMENT EXTREMITY
Anesthesia: General | Site: Leg Lower | Laterality: Left

## 2017-04-08 MED ORDER — FENTANYL CITRATE (PF) 250 MCG/5ML IJ SOLN
INTRAMUSCULAR | Status: DC | PRN
  Administered 2017-04-08: 50 ug via INTRAVENOUS

## 2017-04-08 MED ORDER — PROPOFOL 10 MG/ML IV BOLUS
INTRAVENOUS | Status: AC
  Filled 2017-04-08: qty 20

## 2017-04-08 MED ORDER — PHENYLEPHRINE 40 MCG/ML (10ML) SYRINGE FOR IV PUSH (FOR BLOOD PRESSURE SUPPORT)
PREFILLED_SYRINGE | INTRAVENOUS | Status: DC | PRN
  Administered 2017-04-08: 160 ug via INTRAVENOUS
  Administered 2017-04-08 (×2): 80 ug via INTRAVENOUS

## 2017-04-08 MED ORDER — LIDOCAINE 2% (20 MG/ML) 5 ML SYRINGE
INTRAMUSCULAR | Status: DC | PRN
  Administered 2017-04-08: 60 mg via INTRAVENOUS

## 2017-04-08 MED ORDER — PROPOFOL 10 MG/ML IV BOLUS
INTRAVENOUS | Status: DC | PRN
  Administered 2017-04-08: 80 mg via INTRAVENOUS

## 2017-04-08 MED ORDER — EPHEDRINE 5 MG/ML INJ
INTRAVENOUS | Status: AC
  Filled 2017-04-08: qty 10

## 2017-04-08 MED ORDER — LIDOCAINE 2% (20 MG/ML) 5 ML SYRINGE
INTRAMUSCULAR | Status: AC
  Filled 2017-04-08: qty 5

## 2017-04-08 MED ORDER — CHLORHEXIDINE GLUCONATE CLOTH 2 % EX PADS
6.0000 | MEDICATED_PAD | Freq: Once | CUTANEOUS | Status: AC
  Administered 2017-04-08: 6 via TOPICAL

## 2017-04-08 MED ORDER — SODIUM CHLORIDE 0.9 % IV SOLN
INTRAVENOUS | Status: DC | PRN
  Administered 2017-04-08: 500 mL

## 2017-04-08 MED ORDER — IOPAMIDOL (ISOVUE-370) INJECTION 76%
INTRAVENOUS | Status: AC
  Administered 2017-04-08: 80 mL
  Filled 2017-04-08: qty 100

## 2017-04-08 MED ORDER — ONDANSETRON HCL 4 MG/2ML IJ SOLN
INTRAMUSCULAR | Status: DC | PRN
  Administered 2017-04-08: 4 mg via INTRAVENOUS

## 2017-04-08 MED ORDER — PHENYLEPHRINE 40 MCG/ML (10ML) SYRINGE FOR IV PUSH (FOR BLOOD PRESSURE SUPPORT)
PREFILLED_SYRINGE | INTRAVENOUS | Status: AC
  Filled 2017-04-08: qty 10

## 2017-04-08 MED ORDER — CIPROFLOXACIN IN D5W 400 MG/200ML IV SOLN
400.0000 mg | INTRAVENOUS | Status: AC
  Administered 2017-04-08: 400 mg via INTRAVENOUS
  Filled 2017-04-08: qty 200

## 2017-04-08 MED ORDER — ONDANSETRON HCL 4 MG/2ML IJ SOLN
INTRAMUSCULAR | Status: AC
  Filled 2017-04-08: qty 2

## 2017-04-08 MED ORDER — FUROSEMIDE 10 MG/ML IJ SOLN
20.0000 mg | Freq: Once | INTRAMUSCULAR | Status: AC
  Administered 2017-04-08: 20 mg via INTRAVENOUS
  Filled 2017-04-08: qty 2

## 2017-04-08 MED ORDER — FENTANYL CITRATE (PF) 250 MCG/5ML IJ SOLN
INTRAMUSCULAR | Status: AC
  Filled 2017-04-08: qty 5

## 2017-04-08 MED ORDER — EPHEDRINE SULFATE-NACL 50-0.9 MG/10ML-% IV SOSY
PREFILLED_SYRINGE | INTRAVENOUS | Status: DC | PRN
  Administered 2017-04-08: 5 mg via INTRAVENOUS

## 2017-04-08 MED ORDER — PHENYLEPHRINE HCL 10 MG/ML IJ SOLN
INTRAVENOUS | Status: DC | PRN
  Administered 2017-04-08: 100 ug/min via INTRAVENOUS

## 2017-04-08 MED ORDER — 0.9 % SODIUM CHLORIDE (POUR BTL) OPTIME
TOPICAL | Status: DC | PRN
  Administered 2017-04-08: 1000 mL

## 2017-04-08 MED ORDER — LACTATED RINGERS IV SOLN
INTRAVENOUS | Status: DC
  Administered 2017-04-08 – 2017-04-10 (×2): via INTRAVENOUS

## 2017-04-08 SURGICAL SUPPLY — 56 items
BANDAGE ACE 4X5 VEL STRL LF (GAUZE/BANDAGES/DRESSINGS) IMPLANT
BANDAGE ELASTIC 4 VELCRO ST LF (GAUZE/BANDAGES/DRESSINGS) ×2 IMPLANT
BLADE CLIPPER SURG (BLADE) IMPLANT
BNDG GAUZE ELAST 4 BULKY (GAUZE/BANDAGES/DRESSINGS) ×2 IMPLANT
CANISTER SUCT 3000ML PPV (MISCELLANEOUS) ×3 IMPLANT
CANISTER WOUND CARE 500ML ATS (WOUND CARE) ×5 IMPLANT
CHLORAPREP W/TINT 26ML (MISCELLANEOUS) IMPLANT
COVER SURGICAL LIGHT HANDLE (MISCELLANEOUS) ×3 IMPLANT
DRAPE HALF SHEET 40X57 (DRAPES) IMPLANT
DRAPE INCISE IOBAN 66X45 STRL (DRAPES) IMPLANT
DRAPE ORTHO SPLIT 77X108 STRL (DRAPES)
DRAPE SURG ORHT 6 SPLT 77X108 (DRAPES) IMPLANT
DRESSING HYDROCOLLOID 4X4 (GAUZE/BANDAGES/DRESSINGS) ×3 IMPLANT
DRSG ADAPTIC 3X8 NADH LF (GAUZE/BANDAGES/DRESSINGS) IMPLANT
DRSG CUTIMED SORBACT 7X9 (GAUZE/BANDAGES/DRESSINGS) ×2 IMPLANT
DRSG PAD ABDOMINAL 8X10 ST (GAUZE/BANDAGES/DRESSINGS) IMPLANT
DRSG VAC ATS LRG SENSATRAC (GAUZE/BANDAGES/DRESSINGS) IMPLANT
DRSG VAC ATS MED SENSATRAC (GAUZE/BANDAGES/DRESSINGS) IMPLANT
DRSG VAC ATS SM SENSATRAC (GAUZE/BANDAGES/DRESSINGS) ×2 IMPLANT
ELECT CAUTERY BLADE 6.4 (BLADE) ×3 IMPLANT
ELECT REM PT RETURN 9FT ADLT (ELECTROSURGICAL) ×3
ELECTRODE REM PT RTRN 9FT ADLT (ELECTROSURGICAL) ×1 IMPLANT
GAUZE SPONGE 4X4 12PLY STRL (GAUZE/BANDAGES/DRESSINGS) IMPLANT
GEL ULTRASOUND 20GR AQUASONIC (MISCELLANEOUS) IMPLANT
GLOVE BIO SURGEON STRL SZ 6.5 (GLOVE) ×4 IMPLANT
GLOVE BIO SURGEONS STRL SZ 6.5 (GLOVE) ×2
GOWN STRL REUS W/ TWL LRG LVL3 (GOWN DISPOSABLE) ×3 IMPLANT
GOWN STRL REUS W/TWL LRG LVL3 (GOWN DISPOSABLE) ×9
HANDPIECE INTERPULSE COAX TIP (DISPOSABLE)
KIT BASIN OR (CUSTOM PROCEDURE TRAY) ×3 IMPLANT
KIT ROOM TURNOVER OR (KITS) ×3 IMPLANT
MATRIX WOUND 3-LAYER 7X10 (Tissue) ×1 IMPLANT
MICROMATRIX 1000MG (Tissue) ×3 IMPLANT
NS IRRIG 1000ML POUR BTL (IV SOLUTION) ×3 IMPLANT
PACK GENERAL/GYN (CUSTOM PROCEDURE TRAY) ×3 IMPLANT
PACK ORTHO EXTREMITY (CUSTOM PROCEDURE TRAY) ×3 IMPLANT
PAD ABD 8X10 STRL (GAUZE/BANDAGES/DRESSINGS) ×2 IMPLANT
PAD ARMBOARD 7.5X6 YLW CONV (MISCELLANEOUS) ×6 IMPLANT
PAD NEG PRESSURE SENSATRAC (MISCELLANEOUS) IMPLANT
SET HNDPC FAN SPRY TIP SCT (DISPOSABLE) IMPLANT
SOLUTION PARTIC MCRMTRX 1000MG (Tissue) IMPLANT
STAPLER VISISTAT 35W (STAPLE) IMPLANT
STOCKINETTE IMPERVIOUS 9X36 MD (GAUZE/BANDAGES/DRESSINGS) IMPLANT
STOCKINETTE IMPERVIOUS LG (DRAPES) IMPLANT
SUT SILK 4 0 P 3 (SUTURE) IMPLANT
SUT SILK 4 0 PS 2 (SUTURE) IMPLANT
SUT VIC AB 5-0 PS2 18 (SUTURE) ×2 IMPLANT
SWAB COLLECTION DEVICE MRSA (MISCELLANEOUS) ×2 IMPLANT
SWAB CULTURE ESWAB REG 1ML (MISCELLANEOUS) ×2 IMPLANT
TOWEL OR 17X24 6PK STRL BLUE (TOWEL DISPOSABLE) ×3 IMPLANT
TOWEL OR 17X26 10 PK STRL BLUE (TOWEL DISPOSABLE) ×3 IMPLANT
TUBE CONNECTING 12'X1/4 (SUCTIONS) ×1
TUBE CONNECTING 12X1/4 (SUCTIONS) ×2 IMPLANT
UNDERPAD 30X30 (UNDERPADS AND DIAPERS) IMPLANT
WOUND MATRIX 3-LAYER 7X10 (Tissue) ×1 IMPLANT
YANKAUER SUCT BULB TIP NO VENT (SUCTIONS) ×3 IMPLANT

## 2017-04-08 NOTE — Progress Notes (Signed)
Patient's continuous pulse ox ringing out 79% on 3L o2 Angel Estrada. Nurse at bedside, increased oxygen to 5-6L with no improvement in o2 sat. Patient encouraged to cough and deep breath and encouraged to use incentive spirometer. O2 sat increased to 83%. Respiratory notified, Dr Tana Coast and RR RN notified of findings. All other VSS. Patient having shallow breathing, lung sounds diminshed throughout. Patient placed on nonrebreather for short period of time, o2 sat increased to 100%. Attempted to place patient back on 6L o2 Angel Estrada without success. Patient placed on Venti Mask 30% by respiratory therapy. Again increased to 8L 50% on venti mask. Patient stayed alert and oriented during event. Blood sugar normal. Chest XRAY and CTA performed. Dr. Tana Coast notified of results. Patient sitting up in bed, no s/s of distress.

## 2017-04-08 NOTE — Progress Notes (Signed)
Pharmacy Antibiotic Note Angel Estrada is a 83 y.o. female admitted on 04/01/2017 with cellulitis and LLE wound that may require debridement by plastic sx. Currently on day 5 Azactam, Flagyl and vancomycin for treatment.   D#8 of abx for cellulitis, recently treated and d/c'd on doxy outpt. Complicated with insect infestation and contaminated by cat hair. Xray neg for OM, [place] gas. Afebrile, WBC down to 15.8. Plastics did I&D and took back to OR on 12/5 for further debridement. Wound cx drawn and sent.  Plan: Continue vancomycin 1gm IV Q24H Continue Azactam 1gm IV Q8H Continue Flagyl 549m PO Q8H Monitor clinical picture, renal function, VT prn F/U abx deescalation / LOT  Height: 5' (152.4 cm) Weight: 95 lb (43.1 kg) IBW/kg (Calculated) : 45.5  Temp (24hrs), Avg:98 F (36.7 C), Min:97.5 F (36.4 C), Max:98.8 F (37.1 C)  Recent Labs  Lab 04/01/17 1250 04/01/17 1952 04/02/17 0258 04/03/17 0420 04/04/17 0603 04/04/17 2222 04/05/17 0147 04/05/17 1533 04/06/17 0459 04/07/17 0603 04/08/17 0537  WBC  --   --  11.9* 18.6* 21.4*  --   --   --  15.8* 13.5* 16.6*  CREATININE  --   --  0.73 0.87 0.79  --   --   --  0.73  --  0.70  LATICACIDVEN 3.92* 2.3* 1.3  --   --  1.2 0.9  --   --   --   --   VANCOTROUGH  --   --   --   --   --   --   --  13*  --   --   --     Estimated Creatinine Clearance: 37.5 mL/min (by C-G formula based on SCr of 0.7 mg/dL).    Allergies  Allergen Reactions  . Penicillins Swelling and Rash    Site of swelling (?) Has patient had a PCN reaction causing immediate rash, facial/tongue/throat swelling, SOB or lightheadedness with hypotension: Yes Has patient had a PCN reaction causing severe rash involving mucus membranes or skin necrosis: No Has patient had a PCN reaction that required hospitalization: No Has patient had a PCN reaction occurring within the last 10 years: No If all of the above answers are "NO", then may proceed with Cephalosporin use.   .Marland Kitchen Lisinopril-Hydrochlorothiazide Other (See Comments) and Cough    Pt's PCP took her off medication because of possible kidney damage  . Sulfa Antibiotics Other (See Comments)    Unknown reaction-- childhood allergy   . Latex Rash   Thank you for allowing pharmacy to be a part of this patient's care.  NElenor Quinones PharmD, BCPS Clinical Pharmacist Pager [phone]/09/2016 12:10 PM

## 2017-04-08 NOTE — Anesthesia Procedure Notes (Addendum)
Procedure Name: LMA Insertion Date/Time: 04/08/2017 10:06 AM Performed by: Barrington Ellison, CRNA Pre-anesthesia Checklist: Patient identified, Emergency Drugs available, Suction available and Patient being monitored Patient Re-evaluated:Patient Re-evaluated prior to induction Oxygen Delivery Method: Circle System Utilized Preoxygenation: Pre-oxygenation with 100% oxygen Induction Type: IV induction Ventilation: Mask ventilation without difficulty LMA: LMA inserted LMA Size: 3.0 Number of attempts: 1 Placement Confirmation: positive ETCO2 Tube secured with: Tape Dental Injury: Teeth and Oropharynx as per pre-operative assessment

## 2017-04-08 NOTE — Progress Notes (Signed)
Pt has been to OR today with successful I and D and placement of the VAC.  Will discontinue hydrotherapy at this time. 04/08/2017  Donnella Sham, [place] [phone]  (pager)

## 2017-04-08 NOTE — Interval H&P Note (Signed)
History and Physical Interval Note:  04/08/2017 8:25 AM  [name]  has presented today for surgery, with the diagnosis of LEFT LOWER LEG WOUND  The various methods of treatment have been discussed with the patient and family. After consideration of risks, benefits and other options for treatment, the patient has consented to  Procedure(s): IRRIGATION AND DEBRIDEMENT EXTREMITY (Left) APPLICATION OF A-CELL OF EXTREMITY (Left) APPLICATION OF WOUND VAC (Left) as a surgical intervention .  The patient's history has been reviewed, patient examined, no change in status, stable for surgery.  I have reviewed the patient's chart and labs.  Questions were answered to the patient's satisfaction.     Loel Lofty [place]

## 2017-04-08 NOTE — Op Note (Addendum)
DATE OF OPERATION: 04/08/2017  LOCATION: [facility] In[facility]  PREOPERATIVE DIAGNOSIS: Left leg wound  POSTOPERATIVE DIAGNOSIS: Same  PROCEDURE:  1. Excisional debridement of left leg wound 6 x 8 cm skin, subcutaneous and fat tissue. 2. Placement of Acell (1 gm and 5 x 5 cm sheet).   3. Cultures and path sent and VAC applied.  SURGEON: Laritza Vokes Sanger Pantera [place], DO  EBL: 10 cc  CONDITION: Stable  COMPLICATIONS: None  INDICATION: The patient, Angel Estrada, is a 83 y.o. female born on 02/25/1934, is here for treatment of a left leg wound.   PROCEDURE DETAILS:  The patient was seen prior to surgery and marked.  The IV antibiotics were given. The patient was taken to the operating room and given a general anesthetic. A standard time out was performed and all information was confirmed by those in the room. SCD was placed on the right leg.   The leg was prepped and draped in the usual sterile fashion.  Extreme care was given to handling her leg as her skin was very friable.  The saline was used to irrigate the wound.  Cultures were obtained.  The #10 blade was used to excise the skin and nonviable soft tissue (subcutaneous tissue and fat) lateral and medial and this was sent for path.  The wound was 6  x 8 cm in size. The wound was then irrigated with antibiotic solution.  Hemostasis was achieved with electrocautery.  All of the acell powder and sheet were then applied and secured with the 5-0 Vicryl.  The sorbact was placed with ky gel.  The restore was placed around the wound for protection of the skin.  The VAC was applied with an excellent seal at 100 mmHg pressure. The patient was allowed to wake up and taken to recovery room in stable condition at the end of the case. The family was notified at the end of the case.

## 2017-04-08 NOTE — Anesthesia Postprocedure Evaluation (Signed)
Anesthesia Post Note  Patient: Angel Estrada  Procedure(s) Performed: IRRIGATION AND DEBRIDEMENT EXTREMITY (Left Leg Lower) APPLICATION OF A-CELL OF EXTREMITY (Left Leg Lower) APPLICATION OF WOUND VAC (Left Leg Lower)     Patient location during evaluation: PACU Anesthesia Type: General Level of consciousness: awake and alert Pain management: pain level controlled Vital Signs Assessment: post-procedure vital signs reviewed and stable Respiratory status: spontaneous breathing, nonlabored ventilation, respiratory function stable and patient connected to nasal cannula oxygen Cardiovascular status: blood pressure returned to baseline and stable Postop Assessment: no apparent nausea or vomiting Anesthetic complications: no    Last Vitals:  Vitals:   04/08/17 1800 04/08/17 1805  BP:  103/70  Pulse:  89  Resp:    Temp:    SpO2: 97% 93%    Last Pain:  Vitals:   04/08/17 1200  TempSrc: Oral  PainSc:                  Audry Pili

## 2017-04-08 NOTE — Progress Notes (Addendum)
[facility]                                                                              Patient Demographics  Angel Estrada, is a 83 y.o. female, DOB - May 01, 1934, VPX:[id number]  Admit date - 04/01/2017   Admitting Physician Geradine Girt, DO  Outpatient Primary MD for the patient is Jani Gravel, MD  Outpatient specialists:   LOS - 7  days   Medical records reviewed and are as summarized below:    Chief Complaint  Patient presents with  . Leg Injury       Brief summary   83 year old female presented with left leg wound found to be infested with insects and contaminated with cat hair. Admitted for sepsis secondary to LLE wound cellulitis complicated by insect infestation.   Assessment & Plan    Principal Problem:   Left leg cellulitis -Complicated wound with insect infestation, contaminated by cat hair -Continue IV vancomycin, blood cultures negative so far -Plastic surgery following, plan for debridement today -Wound care following  Active Problems:   Hypertension -Currently stable    COPD (chronic obstructive pulmonary disease) (HCC) -No acute wheezing, continue albuterol, Dulera    Diabetes mellitus ([place]) -Continue sliding scale insulin    Pulmonary hypertension (HCC) -Continue sildenafil    Chronic diastolic CHF (congestive heart failure) (HCC) -Appears stable, will resume diuretics in a.m.    Severe malnutrition ([place]) Continue Ensure  Addendum: Ac hypoxic respiratory failure in the setting of surgery today, post op, anesthesia, hx of PHTN: - CXR with no acute pathology or PNA except chronic left diaphramatic hernia. - D dimer elevated hence stat CTA was ordered, negative for PE - BNP elevated 409.5, gave lasix 61m IV x1  - wean O2 as tolerated, incentive spirometry   Code Status: Full CODE STATUS DVT Prophylaxis: Lovenox after the surgery Family Communication: Discussed in detail with the patient, all imaging results, lab  results explained to the patient   Disposition Plan: Not medically ready  Time Spent in minutes   35 minutes  Procedures:  Debridement in the OR today  Consultants:   Plastic surgery  Antimicrobials:   IV vancomycin 12/2   Medications  Scheduled Meds: . allopurinol  200 mg Oral Daily  . Chlorhexidine Gluconate Cloth  6 each Topical Once  . collagenase   Topical Daily  . feeding supplement (ENSURE ENLIVE)  237 mL Oral BID BM  . insulin aspart  0-5 Units Subcutaneous QHS  . insulin aspart  0-9 Units Subcutaneous TID WC  . metroNIDAZOLE  500 mg Oral Q8H  . mometasone-formoterol  2 puff Inhalation BID  . multivitamin with minerals  1 tablet Oral Daily  . polyvinyl alcohol  1 drop Both Eyes BID  . sildenafil  20 mg Oral TID   Continuous Infusions: . aztreonam Stopped (04/08/17 0604)  . lactated ringers 10 mL/hr at 04/08/17 0914  . vancomycin Stopped (04/07/17 1756)   PRN Meds:.acetaminophen **OR** acetaminophen, albuterol, fluticasone, ondansetron **OR** ondansetron (ZOFRAN) IV   Antibiotics   Anti-infectives (From admission, onward)   Start     Dose/Rate Route Frequency Ordered Stop  04/08/17 1030  polymyxin B 500,000 Units, bacitracin 50,000 Units in sodium chloride 0.9 % 500 mL irrigation  Status:  Discontinued       As needed 04/08/17 1030 04/08/17 1048   04/08/17 0815  ciprofloxacin (CIPRO) IVPB 400 mg     400 mg 200 mL/hr over 60 Minutes Intravenous On call to O.R. 04/08/17 0809 04/08/17 1010   04/05/17 1719  vancomycin (VANCOCIN) IVPB 1000 mg/200 mL premix     1,000 mg 200 mL/hr over 60 Minutes Intravenous Every 24 hours 04/05/17 1719     04/03/17 1400  metroNIDAZOLE (FLAGYL) tablet 500 mg     500 mg Oral Every 8 hours 04/03/17 0955     04/02/17 1600  vancomycin (VANCOCIN) IVPB 750 mg/150 ml premix  Status:  Discontinued     750 mg 150 mL/hr over 60 Minutes Intravenous Every 24 hours 04/01/17 1819 04/05/17 1719   04/02/17 0945  metroNIDAZOLE (FLAGYL) IVPB  500 mg  Status:  Discontinued     500 mg 100 mL/hr over 60 Minutes Intravenous Every 8 hours 04/02/17 0935 04/03/17 0955   04/01/17 2200  aztreonam (AZACTAM) 1 g in dextrose 5 % 50 mL IVPB     1 g 100 mL/hr over 30 Minutes Intravenous Every 8 hours 04/01/17 1819     04/01/17 1215  aztreonam (AZACTAM) 2 g in dextrose 5 % 50 mL IVPB     2 g 100 mL/hr over 30 Minutes Intravenous  Once 04/01/17 1208 04/01/17 1344   04/01/17 1215  metroNIDAZOLE (FLAGYL) IVPB 500 mg     500 mg 100 mL/hr over 60 Minutes Intravenous  Once 04/01/17 1208 04/01/17 1445   04/01/17 1215  vancomycin (VANCOCIN) IVPB 1000 mg/200 mL premix     1,000 mg 200 mL/hr over 60 Minutes Intravenous  Once 04/01/17 1208 04/01/17 1603        Subjective:   [name] was seen and examined today.  Appears to be somewhat confused.  Denies any specific complaints, no fevers or chills. Patient denies dizziness, chest pain, shortness of breath, abdominal pain, N/V/D/C, new weakness, numbess, tingling. No acute events overnight.    Objective:   Vitals:   04/08/17 1115 04/08/17 1122 04/08/17 1130 04/08/17 1131  BP:  98/73  93/65  Pulse: 87 84 83 83  Resp: _0 Temp:    97.7 F (36.5 C)  TempSrc:      SpO2: 90% 99% 99% 99%  Weight:      Height:        Intake/Output Summary (Last 24 hours) at 04/08/2017 1156 Last data filed at 04/08/2017 1133 Gross per 24 hour  Intake 890 ml  Output 50 ml  Net 840 ml     Wt Readings from Last 3 Encounters:  04/08/17 43.1 kg (95 lb)  03/26/17 43.5 kg (95 lb 14.4 oz)  10/06/16 61.2 kg (135 lb)     Exam  General: Alert and oriented x self, NAD  Eyes:   HEENT:  Atraumatic, normocephalic  Cardiovascular: S1 S2 auscultated, no rubs, murmurs or gallops. Regular rate and rhythm.  Respiratory: Clear to auscultation bilaterally, no wheezing, rales or rhonchi  Gastrointestinal: Soft, nontender, nondistended, + bowel sounds  Ext: no pedal edema bilaterally  Neuro:  moving all 4 extremities  Musculoskeletal: No digital cyanosis, clubbing  Skin: No rashes  Psych: appears to be somewhat confused   Data Reviewed:  I have personally reviewed following labs and imaging studies  Micro Results Recent Results (  from the past 240 hour(s))  Blood Culture (routine x 2)     Status: None   Collection Time: 04/01/17 12:39 PM  Result Value Ref Range Status   Specimen Description BLOOD RIGHT ANTECUBITAL  Final   Special Requests IN PEDIATRIC BOTTLE Blood Culture adequate volume  Final   Culture NO GROWTH 5 DAYS  Final   Report Status 04/06/2017 FINAL  Final  Blood Culture (routine x 2)     Status: None   Collection Time: 04/01/17 12:45 PM  Result Value Ref Range Status   Specimen Description BLOOD LEFT ANTECUBITAL  Final   Special Requests IN PEDIATRIC BOTTLE Blood Culture adequate volume  Final   Culture NO GROWTH 5 DAYS  Final   Report Status 04/06/2017 FINAL  Final  Surgical pcr screen     Status: None   Collection Time: 04/08/17  5:17 AM  Result Value Ref Range Status   MRSA, PCR NEGATIVE NEGATIVE Final   Staphylococcus aureus NEGATIVE NEGATIVE Final    Comment: (NOTE) The Xpert SA Assay (FDA approved for NASAL specimens in patients 87 years of age and older), is one component of a comprehensive surveillance program. It is not intended to diagnose infection nor to guide or monitor treatment.     Radiology Reports Dg Tibia/fibula Left  Result Date: 04/01/2017 CLINICAL DATA:  Large open wound at mid lower leg possible infection, history diabetes mellitus, hypertension, CHF EXAM: LEFT TIBIA AND FIBULA - 2 VIEW COMPARISON:  03/25/2017 FINDINGS: Osseous demineralization. Hip and knee joint spaces preserved. No acute fracture, dislocation or bone destruction. Scattered soft tissue swelling LEFT lower leg with large area of soft tissue irregularity and ulceration at the anterior aspect of the mid lower leg. No definite associated radiopaque foreign  bodies or underlying osseous changes. IMPRESSION: Soft tissue changes without acute osseous abnormalities. Electronically Signed   By: Lavonia Dana M.D.   On: 04/01/2017 13:07   Dg Tibia/fibula Left  Result Date: 03/25/2017 CLINICAL DATA:  Nonhealing wound in the anterior left lower extremity. EXAM: LEFT TIBIA AND FIBULA - 2 VIEW COMPARISON:  None. FINDINGS: Soft swelling is present anterior to the tibia. There is no underlying osseous abnormality. No radiopaque foreign body is present. The knee and ankle joints are unremarkable. IMPRESSION: 1. Soft tissue swelling anterior to the tibia without underlying fracture or osseous abnormality. Electronically Signed   By: San Morelle M.D.   On: 03/25/2017 17:23   Ct Head Wo Contrast  Result Date: 04/05/2017 CLINICAL DATA:  Sudden onset of confusion and neck pain EXAM: CT HEAD WITHOUT CONTRAST CT CERVICAL SPINE WITHOUT CONTRAST TECHNIQUE: Multidetector CT imaging of the head and cervical spine was performed following the standard protocol without intravenous contrast. Multiplanar CT image reconstructions of the cervical spine were also generated. COMPARISON:  None. FINDINGS: CT HEAD FINDINGS Brain: No acute territorial infarction, hemorrhage, on or intracranial mass is visualized. Moderate small vessel ischemic changes of the white matter. Moderate atrophy. Nonenlarged ventricles. Vascular: No hyperdense vessels. Vertebral artery and carotid artery calcification Skull: No fracture.  Small osteoma frontal bone. Sinuses/Orbits: No acute abnormality. Other: None CT CERVICAL SPINE FINDINGS Alignment: Trace anterolisthesis of C3 on C4 and C4 on C5. Straightening of the cervical spine. Facet alignment within normal limits Skull base and vertebrae: No acute fracture. No primary bone lesion or focal pathologic process. Soft tissues and spinal canal: No prevertebral fluid or swelling. No visible canal hematoma. Disc levels: Advanced arthritis at C5-C6. Moderate  severe degenerative changes at C4-C5  and C6-C7. Upper chest: Apical emphysema. Probable subcentimeter hypodense nodule in the right lobe of thyroid Other: None IMPRESSION: 1. No CT evidence for acute intracranial abnormality. Atrophy and small vessel ischemic changes of the white matter 2. Trace anterolisthesis of C3 on C4. Degenerative changes most marked at C5-C6. No acute osseous abnormality 3. Apical emphysema Electronically Signed   By: Donavan Foil M.D.   On: 04/05/2017 02:35   Ct Cervical Spine Wo Contrast  Result Date: 04/05/2017 CLINICAL DATA:  Sudden onset of confusion and neck pain EXAM: CT HEAD WITHOUT CONTRAST CT CERVICAL SPINE WITHOUT CONTRAST TECHNIQUE: Multidetector CT imaging of the head and cervical spine was performed following the standard protocol without intravenous contrast. Multiplanar CT image reconstructions of the cervical spine were also generated. COMPARISON:  None. FINDINGS: CT HEAD FINDINGS Brain: No acute territorial infarction, hemorrhage, on or intracranial mass is visualized. Moderate small vessel ischemic changes of the white matter. Moderate atrophy. Nonenlarged ventricles. Vascular: No hyperdense vessels. Vertebral artery and carotid artery calcification Skull: No fracture.  Small osteoma frontal bone. Sinuses/Orbits: No acute abnormality. Other: None CT CERVICAL SPINE FINDINGS Alignment: Trace anterolisthesis of C3 on C4 and C4 on C5. Straightening of the cervical spine. Facet alignment within normal limits Skull base and vertebrae: No acute fracture. No primary bone lesion or focal pathologic process. Soft tissues and spinal canal: No prevertebral fluid or swelling. No visible canal hematoma. Disc levels: Advanced arthritis at C5-C6. Moderate severe degenerative changes at C4-C5 and C6-C7. Upper chest: Apical emphysema. Probable subcentimeter hypodense nodule in the right lobe of thyroid Other: None IMPRESSION: 1. No CT evidence for acute intracranial abnormality. Atrophy  and small vessel ischemic changes of the white matter 2. Trace anterolisthesis of C3 on C4. Degenerative changes most marked at C5-C6. No acute osseous abnormality 3. Apical emphysema Electronically Signed   By: Donavan Foil M.D.   On: 04/05/2017 02:35   Dg Chest Portable 1 View  Result Date: 04/01/2017 CLINICAL DATA:  Large open wound at mid LEFT lower leg EXAM: PORTABLE CHEST 1 VIEW COMPARISON:  Portable exam 1242 hours compared to 07/30/2015 FINDINGS: Stable heart size. Large LEFT diaphragmatic versus hiatal hernia again identified. Mediastinal contours and pulmonary vascularity otherwise normal. Chronic lung changes at the bases including LEFT basilar atelectasis and scarring at minor fissure. No definite infiltrate, pleural effusion or pneumothorax. Bones demineralized. Scattered atherosclerotic calcifications aorta. IMPRESSION: Large LEFT diaphragmatic versus hiatal hernia. Bronchitic changes with chronic LEFT basilar atelectasis and scarring at minor fissure. No acute infiltrate. Electronically Signed   By: Lavonia Dana M.D.   On: 04/01/2017 13:09    Lab Data:  CBC: Recent Labs  Lab 04/01/17 1242  04/03/17 0420 04/04/17 0603 04/04/17 2248 04/06/17 0459 04/07/17 0603 04/08/17 0537  WBC 13.7*   < > 18.6* 21.4*  --  15.8* 13.5* 16.6*  NEUTROABS 12.1*  --  16.4* 19.0*  --  13.9*  --   --   HGB 17.1*   < > 13.9 12.7  --  12.8 12.7 12.7  HCT 52.6*   < > 43.6 39.6 39.6 40.0 40.1 39.2  MCV 99.2   < > 98.6 96.6  --  97.6 97.8 96.6  PLT 314   < > 332 328  --  335 325 376   < > = values in this interval not displayed.   Basic Metabolic Panel: Recent Labs  Lab 04/02/17 0258 04/03/17 0420 04/04/17 0603 04/06/17 0459 04/08/17 0537  NA 142 138 135 134* 134*  K 3.5 3.6 3.7 4.4 4.4  CL 101 97* 95* 94* 91*  CO2 34* 33* 33* 35* 35*  GLUCOSE 86 81 69 122* 113*  BUN 41* 31* 24* 40* 43*  CREATININE 0.73 0.87 0.79 0.73 0.70  CALCIUM 8.2* 8.3* 8.4* 8.7* 8.1*  MG  --  1.8 1.7  --   --     PHOS  --  2.6 2.6 2.3*  --    GFR: Estimated Creatinine Clearance: 37.5 mL/min (by C-G formula based on SCr of 0.7 mg/dL). Liver Function Tests: Recent Labs  Lab 04/03/17 0420 04/04/17 0603 04/06/17 0459  AST 28 19  --   ALT 18 15  --   ALKPHOS 177* 159*  --   BILITOT 1.0 1.5*  --   PROT 5.4* 4.8*  --   ALBUMIN 2.0* 1.8* 1.6*   No results for input(s): LIPASE, AMYLASE in the last 168 hours. No results for input(s): AMMONIA in the last 168 hours. Coagulation Profile: No results for input(s): INR, PROTIME in the last 168 hours. Cardiac Enzymes: No results for input(s): CKTOTAL, CKMB, CKMBINDEX, TROPONINI in the last 168 hours. BNP (last 3 results) No results for input(s): PROBNP in the last 8760 hours. HbA1C: No results for input(s): HGBA1C in the last 72 hours. CBG: Recent Labs  Lab 04/07/17 2216 04/07/17 2248 04/08/17 0657 04/08/17 0919 04/08/17 1109  GLUCAP 63* 130* 105* 86 120*   Lipid Profile: No results for input(s): CHOL, HDL, LDLCALC, TRIG, CHOLHDL, LDLDIRECT in the last 72 hours. Thyroid Function Tests: No results for input(s): TSH, T4TOTAL, FREET4, T3FREE, THYROIDAB in the last 72 hours. Anemia Panel: No results for input(s): VITAMINB12, FOLATE, FERRITIN, TIBC, IRON, RETICCTPCT in the last 72 hours. Urine analysis:    Component Value Date/Time   COLORURINE YELLOW 04/01/2017 1820   APPEARANCEUR CLEAR 04/01/2017 1820   LABSPEC 1.017 04/01/2017 1820   PHURINE 5.0 04/01/2017 1820   GLUCOSEU NEGATIVE 04/01/2017 1820   HGBUR NEGATIVE 04/01/2017 1820   BILIRUBINUR NEGATIVE 04/01/2017 1820   KETONESUR NEGATIVE 04/01/2017 1820   PROTEINUR NEGATIVE 04/01/2017 1820   NITRITE NEGATIVE 04/01/2017 1820   LEUKOCYTESUR NEGATIVE 04/01/2017 1820     Katrisha Segall M.D. [facility] 04/08/2017, 11:56 AM  Pager: [phone] Between 7am to 7pm - call Pager - [phone]  After 7pm go to [email or web address] - password [email or web address]  Call night coverage person covering after  7pm

## 2017-04-08 NOTE — Significant Event (Signed)
Rapid Response Event Note  Overview:  Called to assist with patient with increasing O2 needs Time Called: 1545 Arrival Time: 1550 Event Type: Respiratory, Hypotension  Initial Focused Assessment:  Alert - warm and dry - oriented and co-op.  Pale.  Being placed on VM by RT Ebony Hail on my arrival.  Bil BS present - shallow breaths unless encouraged.  Few fine crackles RLL.  No distress. Denies SOB or pain.  Left leg DDI.  Right knee swollen and hot to touch - reddened - patient reports it has been hurting for few days - good pulse to foot.  BP soft - 90/63 - has been soft - HR 68 regular - RR and unlabored rate 20.   Interventions:  Monitored for one hour - remains without distress - increased FiO2 to 50% VM - O2 sats 90-94%.  Has hx Pul HTN - denies home O2 use - but reports " I usually need oxygen in hospital."  PCXR done per order Dr. Tana Coast.  Labs drawn.  For CTA.  Good DB when encouraged - denies pain with deep breath.   Plan of Care (if not transferred):  Continous O2 sat monitoring.  To call RRT for distress or continued desaturations.   Follow Up:  CTA negative for PE Event Summary: Name of Physician Notified: Dr. Tana Coast at (PTA RRT)    at    Outcome: Stayed in room and stabalized  Event End Time: 1640  Quin Hoop

## 2017-04-08 NOTE — Anesthesia Preprocedure Evaluation (Addendum)
Anesthesia Evaluation  Patient identified by MRN, date of birth, ID band Patient awake    Reviewed: Allergy & Precautions, NPO status , Patient's Chart, lab work & pertinent test results  Airway Mallampati: III  TM Distance: <3 FB Neck ROM: Limited    Dental  (+) Dental Advisory Given, Poor Dentition, Chipped   Pulmonary COPD,    Pulmonary exam normal breath sounds clear to auscultation       Cardiovascular hypertension, Pt. on medications +CHF  Normal cardiovascular exam Rhythm:Regular Rate:Normal  Pulm HTN  TTE 2017 - EF was in the range of 60% to 65%. Grade 1 diastolic dysfunction. Septal motion showed severe dyssynchrony. Trivial MR with mildly dilated LA. RV systolic fx moderately reduced, RA was severely dilated. Moderate TR. PASP was severely increased. PA   peak pressure: 61 mm Hg (S). A small pericardial effusion was  identified.   Neuro/Psych Depression negative neurological ROS     GI/Hepatic negative GI ROS, Neg liver ROS,   Endo/Other  diabetes  Renal/GU negative Renal ROS  negative genitourinary   Musculoskeletal Scoliosis   Abdominal   Peds  Hematology negative hematology ROS (+)   Anesthesia Other Findings Severe malnutrition  Reproductive/Obstetrics                           Anesthesia Physical Anesthesia Plan  ASA: III  Anesthesia Plan: General   Post-op Pain Management:    Induction: Intravenous  PONV Risk Score and Plan: 3 and Treatment may vary due to age or medical condition and Ondansetron  Airway Management Planned: LMA  Additional Equipment: None  Intra-op Plan:   Post-operative Plan: Extubation in OR  Informed Consent: I have reviewed the patients History and Physical, chart, labs and discussed the procedure including the risks, benefits and alternatives for the proposed anesthesia with the patient or authorized representative who has indicated his/her  understanding and acceptance.   Dental advisory given  Plan Discussed with: CRNA  Anesthesia Plan Comments:         Anesthesia Quick Evaluation

## 2017-04-08 NOTE — Transfer of Care (Signed)
Immediate Anesthesia Transfer of Care Note  Patient: Angel Estrada  Procedure(s) Performed: IRRIGATION AND DEBRIDEMENT EXTREMITY (Left Leg Lower) APPLICATION OF A-CELL OF EXTREMITY (Left Leg Lower) APPLICATION OF WOUND VAC (Left Leg Lower)  Patient Location: PACU  Anesthesia Type:General  Level of Consciousness: drowsy and patient cooperative  Airway & Oxygen Therapy: Patient Spontanous Breathing and Patient connected to nasal cannula oxygen  Post-op Assessment: Report given to RN  Post vital signs: Reviewed and stable  Last Vitals:  Vitals:   04/07/17 2242 04/08/17 0641  BP: 96/65 104/72  Pulse: (!) 50 92  Resp: 15 17  Temp: 36.6 C 37.1 C  SpO2: 92% 95%    Last Pain:  Vitals:   04/08/17 0641  TempSrc: Oral  PainSc:       Patients Stated Pain Goal: 2 (41/36/43 8377)  Complications: No apparent anesthesia complications

## 2017-04-09 ENCOUNTER — Encounter (HOSPITAL_COMMUNITY): Payer: Self-pay | Admitting: Plastic Surgery

## 2017-04-09 LAB — GLUCOSE, CAPILLARY
GLUCOSE-CAPILLARY: 111 mg/dL — AB (ref 65–99)
GLUCOSE-CAPILLARY: 136 mg/dL — AB (ref 65–99)
GLUCOSE-CAPILLARY: 153 mg/dL — AB (ref 65–99)
Glucose-Capillary: 123 mg/dL — ABNORMAL HIGH (ref 65–99)

## 2017-04-09 MED ORDER — FUROSEMIDE 40 MG PO TABS
40.0000 mg | ORAL_TABLET | Freq: Every day | ORAL | Status: DC
  Administered 2017-04-09 – 2017-04-11 (×3): 40 mg via ORAL
  Filled 2017-04-09 (×3): qty 1

## 2017-04-09 NOTE — Social Work (Addendum)
CSW discussed bed offer with [facility] and they advised that they no longer have bed available.  CSW discussed with patient and she is not pleased as she will now have to select another SNF.  CSW will give her time to review other SNF offers and f/u.  1:45pm- CSW contacted patient again to discuss bed offers. Pt wants [facility]. CSW advised that these SNF's do not have any available beds and do not anticipate SNF bed tomorrow. CSW discussed with patient other SNF bed offers and patient unable to decide. CSW will continue to follow up as patient does have bed offers.  2:20pm- Patient has accepted bed offer from [facility]. CSW contacted admission staff who has confirmed SNF placement.   CSW will f/u on disposition.  Elissa Hefty, LCSW Clinical Social Worker [phone]

## 2017-04-09 NOTE — Progress Notes (Signed)
Occupational Therapy Treatment Patient Details Name: Angel Estrada MRN: [id number] DOB: April 26, 1934 Today's Date: 04/09/2017    History of present illness Angel Estrada is a 83 y.o. female with medical history significant of DM, pHTN, depression, and recent LE wound.  She was discharged on 11/23 with the diagnosis of left leg hematoma and surrounding infection.  She was treated with IV vanc and then transitioned to doxycycline which she finished.  initially she was improved but then began to worsen. Septic in ED   OT comments  Pt making progress, albeit slow,  towards acute OT goals. Sat EOB several minutes, close min guard, trunk flexion noted, and fatigued sitting EOB. Pt completed squat-pivot transfer EOB to recliner simulating toilet transfer. Max-total +2 physical A for transfer. Cues for breathing technique. Pt on 2L supplemental O2 throughout session. D/c plan remains appropriate.   Follow Up Recommendations  SNF    Equipment Recommendations  Other (comment)    Recommendations for Other Services      Precautions / Restrictions Precautions Precautions: Fall Precaution Comments: Painful LLE Restrictions Weight Bearing Restrictions: No       Mobility Bed Mobility               General bed mobility comments: pt sitting EOB with PT upon OT arrival  Transfers Overall transfer level: Needs assistance Equipment used: 2 person hand held assist Transfers: Sit to/from [employer] Sit to Stand: Max assist   Squat pivot transfers: Max assist;Total assist;+2 physical assistance     General transfer comment: fatigued quickly during transfer, Max +2 to stand, total for most of squat pivot.     Balance Overall balance assessment: Needs assistance;History of Falls Sitting-balance support: No upper extremity supported;Bilateral upper extremity supported;Feet supported Sitting balance-Leahy Scale: Fair Sitting balance - Comments: increased trunk flexion,  fatigued after a few minutes.     Standing balance-Leahy Scale: Zero Standing balance comment: did not achieve full standing position.                           ADL either performed or assessed with clinical judgement   ADL Overall ADL's : Needs assistance/impaired                         Toilet Transfer: Maximal assistance;Total assistance;+2 for physical assistance;Squat-pivot;BSC Toilet Transfer Details (indicate cue type and reason): simulated with EOB>recliner. First attempted with rw without success. Ultimately +2 max - total A for squat-pivot. Fatigues easily.            General ADL Comments: Pt sat EOB several minutes with trunk flexion noted and fatigued noticably over time. Cues to use BUE to support sitting balance. Pain and generalized weakness impacting session.      Vision       Perception     Praxis      Cognition Arousal/Alertness: Awake/alert Behavior During Therapy: WFL for tasks assessed/performed Overall Cognitive Status: Within Functional Limits for tasks assessed                                 General Comments: generally disheveled appearance        Exercises     Shoulder Instructions       General Comments Pt reports she would lean against wall and use cane to walk short distances in home (to bathroom). She reports having assistance  with meal prep.     Pertinent Vitals/ Pain       Pain Assessment: Faces Faces Pain Scale: Hurts even more Pain Location: LLE pain during transfer Pain Descriptors / Indicators: Aching;Grimacing;Guarding;Moaning Pain Intervention(s): Limited activity within patient's tolerance;Monitored during session;Repositioned  Home Living                                          Prior Functioning/Environment              Frequency  Min 2X/week        Progress Toward Goals  OT Goals(current goals can now be found in the care plan section)  Progress  towards OT goals: Progressing toward goals  Acute Rehab OT Goals Patient Stated Goal: to get better OT Goal Formulation: With patient Time For Goal Achievement: 04/18/17 Potential to Achieve Goals: Fair ADL Goals Pt Will Perform Grooming: with modified independence;sitting Pt Will Transfer to Toilet: with min guard assist;stand pivot transfer Pt Will Perform Toileting - Clothing Manipulation and hygiene: with supervision;sitting/lateral leans Additional ADL Goal #1: Pt will perform bed mobility as precursor to ADL at supervision level  Plan Discharge plan remains appropriate    Co-evaluation    PT/OT/SLP Co-Evaluation/Treatment: Yes Reason for Co-Treatment: For patient/therapist safety;To address functional/ADL transfers   OT goals addressed during session: ADL's and self-care      AM-PAC PT "6 Clicks" Daily Activity     Outcome Measure   Help from another person eating meals?: None Help from another person taking care of personal grooming?: A Little Help from another person toileting, which includes using toliet, bedpan, or urinal?: Total Help from another person bathing (including washing, rinsing, drying)?: A Lot Help from another person to put on and taking off regular upper body clothing?: A Little Help from another person to put on and taking off regular lower body clothing?: A Lot 6 Click Score: 15    End of Session Equipment Utilized During Treatment: Gait belt;Oxygen  OT Visit Diagnosis: Unsteadiness on feet (R26.81);Other abnormalities of gait and mobility (R26.89);Repeated falls (R29.6);History of falling (Z91.81);Adult, failure to thrive (R62.7);Pain Pain - Right/Left: Left Pain - part of body: Leg   Activity Tolerance Patient limited by pain   Patient Left in chair;with call bell/phone within reach   Nurse Communication          Time: 1000-1025 OT Time Calculation (min): 25 min  Charges: OT General Charges $OT Visit: 1 Visit OT Treatments $Self  Care/Home Management : 8-22 mins     Hortencia Pilar 04/09/2017, 12:05 PM

## 2017-04-09 NOTE — Progress Notes (Signed)
Physical Therapy Treatment Patient Details Name: Angel Estrada MRN: [id number] DOB: March 22, 1934 Today's Date: 04/09/2017    History of Present Illness Angel Estrada is a 83 y.o. female with medical history significant of DM, pHTN, depression, and recent LE wound.  She was discharged on 11/23 with the diagnosis of left leg hematoma and surrounding infection.  She was treated with IV vanc and then transitioned to doxycycline which she finished.  initially she was improved but then began to worsen. Septic in ED    PT Comments    Patient required +2 assist for bed mobility and functional transfers. Continue to progress as tolerated with anticipated d/c to SNF for further skilled PT services.     Follow Up Recommendations  SNF     Equipment Recommendations  Other (comment)(tbd at SNF)    Recommendations for Other Services       Precautions / Restrictions Precautions Precautions: Fall Precaution Comments: wound vac L LE Restrictions Weight Bearing Restrictions: No    Mobility  Bed Mobility Overal bed mobility: Needs Assistance Bed Mobility: Supine to Sit;Rolling Rolling: Mod assist   Supine to sit: Mod assist;+2 for physical assistance;HOB elevated     General bed mobility comments: cues for sequencing and technique; use of rail; assist for bilat knee flexion, elevating trunk into sitting, and to bring hips toward EOB  Transfers Overall transfer level: Needs assistance Equipment used: 2 person hand held assist;Rolling walker (2 wheeled) Transfers: Sit to/from [employer] Sit to Stand: Max assist;+2 physical assistance   Squat pivot transfers: Max assist;Total assist;+2 physical assistance     General transfer comment: attempted sit to stand with RW initially; fatigued quickly during transfer, Max +2 to stand, total for most of squat pivot.   Ambulation/Gait             General Gait Details: Unable today   Stairs            Wheelchair  Mobility    Modified Rankin (Stroke Patients Only)       Balance Overall balance assessment: Needs assistance;History of Falls Sitting-balance support: No upper extremity supported;Bilateral upper extremity supported;Feet supported Sitting balance-Leahy Scale: Fair Sitting balance - Comments: increased trunk flexion, fatigued after a few minutes.     Standing balance-Leahy Scale: Zero Standing balance comment: did not achieve full standing position.                            Cognition Arousal/Alertness: Awake/alert Behavior During Therapy: WFL for tasks assessed/performed Overall Cognitive Status: No family/caregiver present to determine baseline cognitive functioning                                        Exercises      General Comments        Pertinent Vitals/Pain Pain Assessment: Faces Faces Pain Scale: Hurts even more Pain Location: LLE  Pain Descriptors / Indicators: Grimacing;Guarding;Sore Pain Intervention(s): Limited activity within patient's tolerance;Monitored during session;Premedicated before session;Repositioned    Home Living                      Prior Function            PT Goals (current goals can now be found in the care plan section) Acute Rehab PT Goals PT Goal Formulation: With patient Time For Goal Achievement: 04/18/17 Potential  to Achieve Goals: Good Progress towards PT goals: Progressing toward goals    Frequency    Min 2X/week      PT Plan Current plan remains appropriate    Co-evaluation PT/OT/SLP Co-Evaluation/Treatment: Yes Reason for Co-Treatment: For patient/therapist safety;To address functional/ADL transfers PT goals addressed during session: Mobility/safety with mobility        AM-PAC PT "6 Clicks" Daily Activity  Outcome Measure  Difficulty turning over in bed (including adjusting bedclothes, sheets and blankets)?: Unable Difficulty moving from lying on back to sitting on  the side of the bed? : Unable Difficulty sitting down on and standing up from a chair with arms (e.g., wheelchair, bedside commode, etc,.)?: Unable Help needed moving to and from a bed to chair (including a wheelchair)?: A Lot Help needed walking in hospital room?: Total Help needed climbing 3-5 steps with a railing? : Total 6 Click Score: 7    End of Session Equipment Utilized During Treatment: Gait belt;Oxygen Activity Tolerance: Patient limited by fatigue Patient left: in chair;with call bell/phone within reach;with chair alarm set Nurse Communication: Mobility status PT Visit Diagnosis: Unsteadiness on feet (R26.81);History of falling (Z91.81);Pain Pain - Right/Left: Left Pain - part of body: Leg     Time: 3967-2897 PT Time Calculation (min) (ACUTE ONLY): 51 min  Charges:  $Therapeutic Activity: 23-37 mins                    G Codes:       Earney Navy, PTA Pager: [phone]     Darliss Cheney 04/09/2017, 4:19 PM

## 2017-04-09 NOTE — Progress Notes (Signed)
[facility]                                                                              Patient Demographics  Angel Estrada, is a 83 y.o. female, DOB - Nov 06, 1933, IRJ:[id number]  Admit date - 04/01/2017   Admitting Physician Angel Girt, DO  Outpatient Primary MD for the patient is Angel Gravel, MD  Outpatient specialists:   LOS - 8  days   Medical records reviewed and are as summarized below:    Chief Complaint  Patient presents with  . Leg Injury       Brief summary   83 year old female presented with left leg wound found to be infested with insects and contaminated with cat hair. Admitted for sepsis secondary to LLE wound cellulitis complicated by insect infestation.   Assessment & Plan    Principal Problem:   Left leg cellulitis -Complicated wound with insect infestation, contaminated by cat hair -Blood cultures negative so far -Continue IV vancomycin, aztreonam, Flagyl -Plastic surgery following, status post debridement with wound VAC, postop day #1  Active Problems: Ac hypoxic respiratory failure in the setting of surgery today, post op, anesthesia, hx of PHTN: - CXR with no acute pathology or PNA except chronic left diaphramatic hernia. -CT angiogram ruled out PE, and BNP slightly elevated 409.5, gave Lasix 20 mg x1  -Improving today, off Ventimask, O2 sats 92% on 2 L, wean O2 as tolerated, add incentive spirometry -Continue nebs as needed    Hypertension -Currently stable    COPD (chronic obstructive pulmonary disease) (HCC) -No acute wheezing, continue albuterol as needed, Dulera    Diabetes mellitus ([place]) -CBGs controlled, continue sliding scale insulin    Pulmonary hypertension (HCC) -Continue sildenafil    Chronic diastolic CHF (congestive heart failure) (HCC) -Currently compensated, resume Lasix 40 mg daily, lower dose (at home on 80 mg in the morning, 40 mg in the p.m.) however BP soft, will not be able to tolerate higher  dose of Lasix at this time    Severe malnutrition ([place]) Continue Ensure  Code Status: Full CODE STATUS DVT Prophylaxis: Heparin subcu Family Communication: Discussed in detail with the patient, all imaging results, lab results explained to the patient   Disposition Plan: Not medically ready  Time Spent in minutes   25 minutes  Procedures:  Debridement in the OR 12/5  Consultants:   Plastic surgery  Antimicrobials:   IV vancomycin 12/2   Medications  Scheduled Meds: . allopurinol  200 mg Oral Daily  . collagenase   Topical Daily  . feeding supplement (ENSURE ENLIVE)  237 mL Oral BID BM  . insulin aspart  0-5 Units Subcutaneous QHS  . insulin aspart  0-9 Units Subcutaneous TID WC  . metroNIDAZOLE  500 mg Oral Q8H  . mometasone-formoterol  2 puff Inhalation BID  . multivitamin with minerals  1 tablet Oral Daily  . polyvinyl alcohol  1 drop Both Eyes BID  . sildenafil  20 mg Oral TID   Continuous Infusions: . aztreonam Stopped (04/09/17 0658)  . lactated ringers 10 mL/hr at 04/08/17 0914  . vancomycin Stopped (04/08/17 1902)   PRN  Meds:.acetaminophen **OR** acetaminophen, albuterol, fluticasone, ondansetron **OR** ondansetron (ZOFRAN) IV   Antibiotics   Anti-infectives (From admission, onward)   Start     Dose/Rate Route Frequency Ordered Stop   04/08/17 1030  polymyxin B 500,000 Units, bacitracin 50,000 Units in sodium chloride 0.9 % 500 mL irrigation  Status:  Discontinued       As needed 04/08/17 1030 04/08/17 1048   04/08/17 0815  ciprofloxacin (CIPRO) IVPB 400 mg     400 mg 200 mL/hr over 60 Minutes Intravenous On call to O.R. 04/08/17 0809 04/08/17 1010   04/05/17 1719  vancomycin (VANCOCIN) IVPB 1000 mg/200 mL premix     1,000 mg 200 mL/hr over 60 Minutes Intravenous Every 24 hours 04/05/17 1719     04/03/17 1400  metroNIDAZOLE (FLAGYL) tablet 500 mg     500 mg Oral Every 8 hours 04/03/17 0955     04/02/17 1600  vancomycin (VANCOCIN) IVPB 750 mg/150 ml  premix  Status:  Discontinued     750 mg 150 mL/hr over 60 Minutes Intravenous Every 24 hours 04/01/17 1819 04/05/17 1719   04/02/17 0945  metroNIDAZOLE (FLAGYL) IVPB 500 mg  Status:  Discontinued     500 mg 100 mL/hr over 60 Minutes Intravenous Every 8 hours 04/02/17 0935 04/03/17 0955   04/01/17 2200  aztreonam (AZACTAM) 1 g in dextrose 5 % 50 mL IVPB     1 g 100 mL/hr over 30 Minutes Intravenous Every 8 hours 04/01/17 1819     04/01/17 1215  aztreonam (AZACTAM) 2 g in dextrose 5 % 50 mL IVPB     2 g 100 mL/hr over 30 Minutes Intravenous  Once 04/01/17 1208 04/01/17 1344   04/01/17 1215  metroNIDAZOLE (FLAGYL) IVPB 500 mg     500 mg 100 mL/hr over 60 Minutes Intravenous  Once 04/01/17 1208 04/01/17 1445   04/01/17 1215  vancomycin (VANCOCIN) IVPB 1000 mg/200 mL premix     1,000 mg 200 mL/hr over 60 Minutes Intravenous  Once 04/01/17 1208 04/01/17 1603        Subjective:   Angel Estrada was seen and examined today.  Appears a lot better today, O2 sats in 90s on 2 L.  States "I always need oxygen in the hospital". Denies any specific complaints, no fevers or chills. Patient denies dizziness, chest pain, shortness of breath, abdominal pain, N/V/D/C, new weakness, numbess, tingling.   Objective:   Vitals:   04/08/17 2007 04/08/17 2120 04/09/17 0751 04/09/17 1054  BP:  99/65  94/65  Pulse:  90  76  Resp:  16    Temp:  97.8 F (36.6 C)    TempSrc:  Oral    SpO2: 94% 99% 92%   Weight:      Height:        Intake/Output Summary (Last 24 hours) at 04/09/2017 1137 Last data filed at 04/09/2017 0700 Gross per 24 hour  Intake -  Output 800 ml  Net -800 ml     Wt Readings from Last 3 Encounters:  04/08/17 43.1 kg (95 lb)  03/26/17 43.5 kg (95 lb 14.4 oz)  10/06/16 61.2 kg (135 lb)     Exam   General: Alert and oriented x 3, NAD  Eyes:   HEENT:  Atraumatic, normocephalic  Cardiovascular: S1 S2 auscultated RRR, No pedal edema b/l  Respiratory: Clear to  auscultation bilaterally, no wheezing, rales or rhonchi  Gastrointestinal: Soft, nontender, nondistended, + bowel sounds  Ext: no pedal edema bilaterally  Neuro: no  new deficits  Musculoskeletal: No digital cyanosis, clubbing  Skin: No rashes  Psych: Normal affect and demeanor, alert and oriented x3    Data Reviewed:  I have personally reviewed following labs and imaging studies  Micro Results Recent Results (from the past 240 hour(s))  Blood Culture (routine x 2)     Status: None   Collection Time: 04/01/17 12:39 PM  Result Value Ref Range Status   Specimen Description BLOOD RIGHT ANTECUBITAL  Final   Special Requests IN PEDIATRIC BOTTLE Blood Culture adequate volume  Final   Culture NO GROWTH 5 DAYS  Final   Report Status 04/06/2017 FINAL  Final  Blood Culture (routine x 2)     Status: None   Collection Time: 04/01/17 12:45 PM  Result Value Ref Range Status   Specimen Description BLOOD LEFT ANTECUBITAL  Final   Special Requests IN PEDIATRIC BOTTLE Blood Culture adequate volume  Final   Culture NO GROWTH 5 DAYS  Final   Report Status 04/06/2017 FINAL  Final  Surgical pcr screen     Status: None   Collection Time: 04/08/17  5:17 AM  Result Value Ref Range Status   MRSA, PCR NEGATIVE NEGATIVE Final   Staphylococcus aureus NEGATIVE NEGATIVE Final    Comment: (NOTE) The Xpert SA Assay (FDA approved for NASAL specimens in patients 73 years of age and older), is one component of a comprehensive surveillance program. It is not intended to diagnose infection nor to guide or monitor treatment.   Aerobic/Anaerobic Culture (surgical/deep wound)     Status: None (Preliminary result)   Collection Time: 04/08/17 10:24 AM  Result Value Ref Range Status   Specimen Description WOUND LEFT LOWER LEG  Final   Special Requests NONE  Final   Gram Stain   Final    RARE WBC PRESENT, PREDOMINANTLY PMN RARE GRAM NEGATIVE RODS    Culture PENDING  Incomplete   Report Status PENDING   Incomplete    Radiology Reports Dg Tibia/fibula Left  Result Date: 04/01/2017 CLINICAL DATA:  Large open wound at mid lower leg possible infection, history diabetes mellitus, hypertension, CHF EXAM: LEFT TIBIA AND FIBULA - 2 VIEW COMPARISON:  03/25/2017 FINDINGS: Osseous demineralization. Hip and knee joint spaces preserved. No acute fracture, dislocation or bone destruction. Scattered soft tissue swelling LEFT lower leg with large area of soft tissue irregularity and ulceration at the anterior aspect of the mid lower leg. No definite associated radiopaque foreign bodies or underlying osseous changes. IMPRESSION: Soft tissue changes without acute osseous abnormalities. Electronically Signed   By: Lavonia Dana M.D.   On: 04/01/2017 13:07   Dg Tibia/fibula Left  Result Date: 03/25/2017 CLINICAL DATA:  Nonhealing wound in the anterior left lower extremity. EXAM: LEFT TIBIA AND FIBULA - 2 VIEW COMPARISON:  None. FINDINGS: Soft swelling is present anterior to the tibia. There is no underlying osseous abnormality. No radiopaque foreign body is present. The knee and ankle joints are unremarkable. IMPRESSION: 1. Soft tissue swelling anterior to the tibia without underlying fracture or osseous abnormality. Electronically Signed   By: San Morelle M.D.   On: 03/25/2017 17:23   Ct Head Wo Contrast  Result Date: 04/05/2017 CLINICAL DATA:  Sudden onset of confusion and neck pain EXAM: CT HEAD WITHOUT CONTRAST CT CERVICAL SPINE WITHOUT CONTRAST TECHNIQUE: Multidetector CT imaging of the head and cervical spine was performed following the standard protocol without intravenous contrast. Multiplanar CT image reconstructions of the cervical spine were also generated. COMPARISON:  None. FINDINGS: CT  HEAD FINDINGS Brain: No acute territorial infarction, hemorrhage, on or intracranial mass is visualized. Moderate small vessel ischemic changes of the white matter. Moderate atrophy. Nonenlarged ventricles. Vascular:  No hyperdense vessels. Vertebral artery and carotid artery calcification Skull: No fracture.  Small osteoma frontal bone. Sinuses/Orbits: No acute abnormality. Other: None CT CERVICAL SPINE FINDINGS Alignment: Trace anterolisthesis of C3 on C4 and C4 on C5. Straightening of the cervical spine. Facet alignment within normal limits Skull base and vertebrae: No acute fracture. No primary bone lesion or focal pathologic process. Soft tissues and spinal canal: No prevertebral fluid or swelling. No visible canal hematoma. Disc levels: Advanced arthritis at C5-C6. Moderate severe degenerative changes at C4-C5 and C6-C7. Upper chest: Apical emphysema. Probable subcentimeter hypodense nodule in the right lobe of thyroid Other: None IMPRESSION: 1. No CT evidence for acute intracranial abnormality. Atrophy and small vessel ischemic changes of the white matter 2. Trace anterolisthesis of C3 on C4. Degenerative changes most marked at C5-C6. No acute osseous abnormality 3. Apical emphysema Electronically Signed   By: Donavan Foil M.D.   On: 04/05/2017 02:35   Ct Angio Chest Pe W Or Wo Contrast  Result Date: 04/08/2017 CLINICAL DATA:  Shortness of breath, orthopnea, back pain, recent surgery for leg wound, elevated D-dimer question pulmonary embolism, history diabetes mellitus, hypertension, CHF, COPD EXAM: CT ANGIOGRAPHY CHEST WITH CONTRAST TECHNIQUE: Multidetector CT imaging of the chest was performed using the standard protocol during bolus administration of intravenous contrast. Multiplanar CT image reconstructions and MIPs were obtained to evaluate the vascular anatomy. CONTRAST:  52m ISOVUE-370 IOPAMIDOL (ISOVUE-370) INJECTION 76% IV COMPARISON:  Noncontrast CT chest 02/21/2016 FINDINGS: Cardiovascular: Atherosclerotic calcifications aorta, proximal great vessels, and coronary arteries. Markedly tortuous innominate artery. Aorta normal caliber without aneurysm or dissection. Minimally dilated central pulmonary  arteries. Pulmonary arteries well opacified and patent. No evidence of pulmonary embolism. No pericardial effusion. Mediastinum/Nodes: Chronic elevation of LEFT diaphragm. Esophagus unremarkable. 9 mm RIGHT thyroid nodule. Few normal sized inferior cervical lymph nodes bilaterally. No intrathoracic adenopathy. Lungs/Pleura: LEFT lower lobe atelectasis. Small LEFT pleural effusion. Minimal atelectasis at RIGHT lung base. No definite infiltrate or pneumothorax. Upper Abdomen: Question gastric wall thickening versus artifact from underdistention. Remaining visualized upper abdomen unremarkable. Musculoskeletal: Bones demineralized with dextroconvex thoracic scoliosis. Review of the MIP images confirms the above findings. IMPRESSION: No evidence of  pulmonary embolism. Enlargement of central pulmonary arteries question pulmonary artery hypertension. Chronic elevation of LEFT diaphragm with small LEFT pleural effusion and complete LEFT lower lobe atelectasis. Questionable gastric wall thickening versus artifact underdistention. Extensive atherosclerotic calcifications. Aortic Atherosclerosis (ICD10-I70.0). Electronically Signed   By: MLavonia DanaM.D.   On: 04/08/2017 17:59   Ct Cervical Spine Wo Contrast  Result Date: 04/05/2017 CLINICAL DATA:  Sudden onset of confusion and neck pain EXAM: CT HEAD WITHOUT CONTRAST CT CERVICAL SPINE WITHOUT CONTRAST TECHNIQUE: Multidetector CT imaging of the head and cervical spine was performed following the standard protocol without intravenous contrast. Multiplanar CT image reconstructions of the cervical spine were also generated. COMPARISON:  None. FINDINGS: CT HEAD FINDINGS Brain: No acute territorial infarction, hemorrhage, on or intracranial mass is visualized. Moderate small vessel ischemic changes of the white matter. Moderate atrophy. Nonenlarged ventricles. Vascular: No hyperdense vessels. Vertebral artery and carotid artery calcification Skull: No fracture.  Small osteoma  frontal bone. Sinuses/Orbits: No acute abnormality. Other: None CT CERVICAL SPINE FINDINGS Alignment: Trace anterolisthesis of C3 on C4 and C4 on C5. Straightening of the cervical spine. Facet alignment within normal limits  Skull base and vertebrae: No acute fracture. No primary bone lesion or focal pathologic process. Soft tissues and spinal canal: No prevertebral fluid or swelling. No visible canal hematoma. Disc levels: Advanced arthritis at C5-C6. Moderate severe degenerative changes at C4-C5 and C6-C7. Upper chest: Apical emphysema. Probable subcentimeter hypodense nodule in the right lobe of thyroid Other: None IMPRESSION: 1. No CT evidence for acute intracranial abnormality. Atrophy and small vessel ischemic changes of the white matter 2. Trace anterolisthesis of C3 on C4. Degenerative changes most marked at C5-C6. No acute osseous abnormality 3. Apical emphysema Electronically Signed   By: Donavan Foil M.D.   On: 04/05/2017 02:35   Dg Chest Port 1 View  Result Date: 04/08/2017 CLINICAL DATA:  Worsening oxygen saturation. EXAM: PORTABLE CHEST 1 VIEW COMPARISON:  04/01/2017.  07/30/2015. FINDINGS: Large chronic diaphragmatic hernia on the left containing multiple loops of intestine with left lower lung volume loss. Right lung is clear except for mild chronic increased markings. No sign of heart failure or effusion. No acute bone finding. 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 Chronic volume loss in the left lower lung secondary to that. No acute finding identified otherwise. Electronically Signed   By: Nelson Chimes M.D.   On: 04/08/2017 15:54   Dg Chest Portable 1 View  Result Date: 04/01/2017 CLINICAL DATA:  Large open wound at mid LEFT lower leg EXAM: PORTABLE CHEST 1 VIEW COMPARISON:  Portable exam 1242 hours compared to 07/30/2015 FINDINGS: Stable heart size. Large LEFT diaphragmatic versus hiatal hernia again identified. Mediastinal  contours and pulmonary vascularity otherwise normal. Chronic lung changes at the bases including LEFT basilar atelectasis and scarring at minor fissure. No definite infiltrate, pleural effusion or pneumothorax. Bones demineralized. Scattered atherosclerotic calcifications aorta. IMPRESSION: Large LEFT diaphragmatic versus hiatal hernia. Bronchitic changes with chronic LEFT basilar atelectasis and scarring at minor fissure. No acute infiltrate. Electronically Signed   By: Lavonia Dana M.D.   On: 04/01/2017 13:09    Lab Data:  CBC: Recent Labs  Lab 04/03/17 0420 04/04/17 0603 04/04/17 2248 04/06/17 0459 04/07/17 0603 04/08/17 0537  WBC 18.6* 21.4*  --  15.8* 13.5* 16.6*  NEUTROABS 16.4* 19.0*  --  13.9*  --   --   HGB 13.9 12.7  --  12.8 12.7 12.7  HCT 43.6 39.6 39.6 40.0 40.1 39.2  MCV 98.6 96.6  --  97.6 97.8 96.6  PLT 332 328  --  335 325 242   Basic Metabolic Panel: Recent Labs  Lab 04/03/17 0420 04/04/17 0603 04/06/17 0459 04/08/17 0537  NA 138 135 134* 134*  K 3.6 3.7 4.4 4.4  CL 97* 95* 94* 91*  CO2 33* 33* 35* 35*  GLUCOSE 81 69 122* 113*  BUN 31* 24* 40* 43*  CREATININE 0.87 0.79 0.73 0.70  CALCIUM 8.3* 8.4* 8.7* 8.1*  MG 1.8 1.7  --   --   PHOS 2.6 2.6 2.3*  --    GFR: Estimated Creatinine Clearance: 37.5 mL/min (by C-G formula based on SCr of 0.7 mg/dL). Liver Function Tests: Recent Labs  Lab 04/03/17 0420 04/04/17 0603 04/06/17 0459  AST 28 19  --   ALT 18 15  --   ALKPHOS 177* 159*  --   BILITOT 1.0 1.5*  --   PROT 5.4* 4.8*  --   ALBUMIN 2.0* 1.8* 1.6*   No results for input(s): LIPASE, AMYLASE in the last 168 hours. No results for input(s): AMMONIA in  the last 168 hours. Coagulation Profile: No results for input(s): INR, PROTIME in the last 168 hours. Cardiac Enzymes: No results for input(s): CKTOTAL, CKMB, CKMBINDEX, TROPONINI in the last 168 hours. BNP (last 3 results) No results for input(s): PROBNP in the last 8760 hours. HbA1C: No  results for input(s): HGBA1C in the last 72 hours. CBG: Recent Labs  Lab 04/08/17 1109 04/08/17 1302 04/08/17 1802 04/08/17 2122 04/09/17 0626  GLUCAP 120* 96 105* 160* 111*   Lipid Profile: No results for input(s): CHOL, HDL, LDLCALC, TRIG, CHOLHDL, LDLDIRECT in the last 72 hours. Thyroid Function Tests: No results for input(s): TSH, T4TOTAL, FREET4, T3FREE, THYROIDAB in the last 72 hours. Anemia Panel: No results for input(s): VITAMINB12, FOLATE, FERRITIN, TIBC, IRON, RETICCTPCT in the last 72 hours. Urine analysis:    Component Value Date/Time   COLORURINE YELLOW 04/01/2017 1820   APPEARANCEUR CLEAR 04/01/2017 1820   LABSPEC 1.017 04/01/2017 1820   PHURINE 5.0 04/01/2017 1820   GLUCOSEU NEGATIVE 04/01/2017 1820   HGBUR NEGATIVE 04/01/2017 1820   BILIRUBINUR NEGATIVE 04/01/2017 1820   KETONESUR NEGATIVE 04/01/2017 1820   PROTEINUR NEGATIVE 04/01/2017 1820   NITRITE NEGATIVE 04/01/2017 1820   LEUKOCYTESUR NEGATIVE 04/01/2017 1820     Ripudeep Rai M.D. [facility] 04/09/2017, 11:37 AM  Pager: [phone] Between 7am to 7pm - call Pager - [phone]  After 7pm go to [email or web address] - password [email or web address]  Call night coverage person covering after 7pm

## 2017-04-10 DIAGNOSIS — E119 Type 2 diabetes mellitus without complications: Secondary | ICD-10-CM

## 2017-04-10 DIAGNOSIS — I509 Heart failure, unspecified: Secondary | ICD-10-CM

## 2017-04-10 DIAGNOSIS — E43 Unspecified severe protein-calorie malnutrition: Secondary | ICD-10-CM

## 2017-04-10 DIAGNOSIS — B879 Myiasis, unspecified: Secondary | ICD-10-CM

## 2017-04-10 DIAGNOSIS — L03116 Cellulitis of left lower limb: Secondary | ICD-10-CM

## 2017-04-10 LAB — C-REACTIVE PROTEIN: CRP: 8 mg/dL — AB (ref <1.0)

## 2017-04-10 LAB — GLUCOSE, CAPILLARY
GLUCOSE-CAPILLARY: 169 mg/dL — AB (ref 65–99)
GLUCOSE-CAPILLARY: 65 mg/dL (ref 65–99)
GLUCOSE-CAPILLARY: 156 mg/dL — AB (ref 65–99)
Glucose-Capillary: 68 mg/dL (ref 65–99)
Glucose-Capillary: 85 mg/dL (ref 65–99)
Glucose-Capillary: 181 mg/dL — ABNORMAL HIGH (ref 65–99)

## 2017-04-10 LAB — HEMOGLOBIN A1C
Hgb A1c MFr Bld: 6.1 % — ABNORMAL HIGH (ref 4.8–5.6)
MEAN PLASMA GLUCOSE: 128.37 mg/dL

## 2017-04-10 LAB — SEDIMENTATION RATE: SED RATE: 88 mm/h — AB (ref 0–22)

## 2017-04-10 MED ORDER — DIPHENHYDRAMINE HCL 50 MG/ML IJ SOLN
12.5000 mg | Freq: Four times a day (QID) | INTRAMUSCULAR | Status: DC | PRN
  Filled 2017-04-10: qty 1

## 2017-04-10 MED ORDER — DEXTROSE 5 % IV SOLN
2.0000 g | INTRAVENOUS | Status: DC
  Administered 2017-04-10: 2 g via INTRAVENOUS
  Filled 2017-04-10 (×2): qty 2

## 2017-04-10 NOTE — Progress Notes (Signed)
[facility]                                                                              Patient Demographics  Angel Estrada, is a 83 y.o. female, DOB - [birth year], TGG:[id number]  Admit date - 04/01/2017   Admitting Physician Geradine Girt, DO  Outpatient Primary MD for the patient is Jani Gravel, MD  Outpatient specialists:   LOS - 9  days   Medical records reviewed and are as summarized below:    Chief Complaint  Patient presents with  . Leg Injury       Brief summary   83 year old female presented with left leg wound found to be infested with insects and contaminated with cat hair. Admitted for sepsis secondary to LLE wound cellulitis complicated by insect infestation.   Assessment & Plan    Principal Problem:   Left leg cellulitis -Complicated wound with insect infestation, contaminated by cat hair -Blood cultures negative so far -Continue IV vancomycin, aztreonam, Flagyl -Plastic surgery was consulted, underwent debridement with wound VAC, postop day # 2 -Patient plastic surgery for further recommendations for disposition, antibiotics, wound VAC, awaiting response, left message.  Active Problems: Ac hypoxic respiratory failure in the setting of surgerym post op, anesthesia, hx of PHTN: - CXR with no acute pathology or PNA except chronic left diaphramatic hernia. -CT angiogram ruled out PE, and BNP slightly elevated 409.5, gave Lasix 20 mg x1  -Improving, off Ventimask, wean O2 as tolerated.  Continue nebs, Lasix daily 40 mg     Hypertension -Currently stable    COPD (chronic obstructive pulmonary disease) (HCC) -No acute wheezing, continue albuterol as needed, Dulera    Diabetes mellitus ([place]) -CBGs controlled, continue sliding scale insulin    Pulmonary hypertension (HCC) -Continue sildenafil    Chronic diastolic CHF (congestive heart failure) (HCC) -BNP 409, continue Lasix 40 mg daily (at home on 80 mg in the morning, 40 mg in the  p.m.) -BP still soft, may not tolerate higher dose of Lasix at this time.      Severe malnutrition ([place]) Continue Ensure  Code Status: Full CODE STATUS DVT Prophylaxis: Heparin subcu Family Communication: Discussed in detail with the patient, all imaging results, lab results explained to the patient   Disposition Plan: Awaiting recommendations from plastic surgery regarding disposition  Time Spent in minutes   25 minutes  Procedures:  Debridement in the OR 12/5  Consultants:   Plastic surgery  Antimicrobials:   IV vancomycin 12/2   Medications  Scheduled Meds: . allopurinol  200 mg Oral Daily  . collagenase   Topical Daily  . feeding supplement (ENSURE ENLIVE)  237 mL Oral BID BM  . furosemide  40 mg Oral Daily  . insulin aspart  0-5 Units Subcutaneous QHS  . insulin aspart  0-9 Units Subcutaneous TID WC  . metroNIDAZOLE  500 mg Oral Q8H  . mometasone-formoterol  2 puff Inhalation BID  . multivitamin with minerals  1 tablet Oral Daily  . polyvinyl alcohol  1 drop Both Eyes BID  . sildenafil  20 mg Oral TID   Continuous Infusions: . aztreonam 1 g (04/10/17  6712)  . lactated ringers 10 mL/hr at 04/10/17 1058   PRN Meds:.acetaminophen **OR** acetaminophen, albuterol, fluticasone, ondansetron **OR** ondansetron (ZOFRAN) IV   Antibiotics   Anti-infectives (From admission, onward)   Start     Dose/Rate Route Frequency Ordered Stop   04/08/17 1030  polymyxin B 500,000 Units, bacitracin 50,000 Units in sodium chloride 0.9 % 500 mL irrigation  Status:  Discontinued       As needed 04/08/17 1030 04/08/17 1048   04/08/17 0815  ciprofloxacin (CIPRO) IVPB 400 mg     400 mg 200 mL/hr over 60 Minutes Intravenous On call to O.R. 04/08/17 0809 04/08/17 1010   04/05/17 1719  vancomycin (VANCOCIN) IVPB 1000 mg/200 mL premix  Status:  Discontinued     1,000 mg 200 mL/hr over 60 Minutes Intravenous Every 24 hours 04/05/17 1719 04/09/17 1402   04/03/17 1400  metroNIDAZOLE (FLAGYL)  tablet 500 mg     500 mg Oral Every 8 hours 04/03/17 0955     04/02/17 1600  vancomycin (VANCOCIN) IVPB 750 mg/150 ml premix  Status:  Discontinued     750 mg 150 mL/hr over 60 Minutes Intravenous Every 24 hours 04/01/17 1819 04/05/17 1719   04/02/17 0945  metroNIDAZOLE (FLAGYL) IVPB 500 mg  Status:  Discontinued     500 mg 100 mL/hr over 60 Minutes Intravenous Every 8 hours 04/02/17 0935 04/03/17 0955   04/01/17 2200  aztreonam (AZACTAM) 1 g in dextrose 5 % 50 mL IVPB     1 g 100 mL/hr over 30 Minutes Intravenous Every 8 hours 04/01/17 1819     04/01/17 1215  aztreonam (AZACTAM) 2 g in dextrose 5 % 50 mL IVPB     2 g 100 mL/hr over 30 Minutes Intravenous  Once 04/01/17 1208 04/01/17 1344   04/01/17 1215  metroNIDAZOLE (FLAGYL) IVPB 500 mg     500 mg 100 mL/hr over 60 Minutes Intravenous  Once 04/01/17 1208 04/01/17 1445   04/01/17 1215  vancomycin (VANCOCIN) IVPB 1000 mg/200 mL premix     1,000 mg 200 mL/hr over 60 Minutes Intravenous  Once 04/01/17 1208 04/01/17 1603        Subjective:   Angel Estrada was seen and examined today.  Denies any specific complaints, no chest pain or shortness of breath.  No fevers or chills.  She denies dizziness, abdominal pain, N/V/D/C, new weakness, numbess, tingling.   Objective:   Vitals:   04/09/17 1942 04/09/17 2041 04/10/17 0533 04/10/17 0941  BP:  94/62 97/67   Pulse:  98 85   Resp:   14   Temp:  98.1 F (36.7 C) 97.8 F (36.6 C)   TempSrc:  Oral Oral   SpO2: 90% 94% 96% 94%  Weight:      Height:        Intake/Output Summary (Last 24 hours) at 04/10/2017 1141 Last data filed at 04/10/2017 0900 Gross per 24 hour  Intake 530 ml  Output 400 ml  Net 130 ml     Wt Readings from Last 3 Encounters:  04/08/17 43.1 kg (95 lb)  03/26/17 43.5 kg (95 lb 14.4 oz)  10/06/16 61.2 kg (135 lb)     Exam    General: Alert and oriented x 3, NAD  Eyes  HEENT:  Atraumatic, normocephalic  Cardiovascular: S1 S2 auscultated, no  rubs, murmurs or gallops. Regular rate and rhythm. No pedal edema b/l  Respiratory: Clear to auscultation bilaterally, no wheezing, rales or rhonchi  Gastrointestinal: Soft, nontender, nondistended, +  bowel sounds  Ext: no pedal edema bilaterally  Neuro no new deficits  Musculoskeletal: No digital cyanosis, clubbing  Skin: Left lower extremity dressing intact, wound VAC+  Psych: Normal affect and demeanor, alert and oriented x3    Data Reviewed:  I have personally reviewed following labs and imaging studies  Micro Results Recent Results (from the past 240 hour(s))  Blood Culture (routine x 2)     Status: None   Collection Time: 04/01/17 12:39 PM  Result Value Ref Range Status   Specimen Description BLOOD RIGHT ANTECUBITAL  Final   Special Requests IN PEDIATRIC BOTTLE Blood Culture adequate volume  Final   Culture NO GROWTH 5 DAYS  Final   Report Status 04/06/2017 FINAL  Final  Blood Culture (routine x 2)     Status: None   Collection Time: 04/01/17 12:45 PM  Result Value Ref Range Status   Specimen Description BLOOD LEFT ANTECUBITAL  Final   Special Requests IN PEDIATRIC BOTTLE Blood Culture adequate volume  Final   Culture NO GROWTH 5 DAYS  Final   Report Status 04/06/2017 FINAL  Final  Surgical pcr screen     Status: None   Collection Time: 04/08/17  5:17 AM  Result Value Ref Range Status   MRSA, PCR NEGATIVE NEGATIVE Final   Staphylococcus aureus NEGATIVE NEGATIVE Final    Comment: (NOTE) The Xpert SA Assay (FDA approved for NASAL specimens in patients 66 years of age and older), is one component of a comprehensive surveillance program. It is not intended to diagnose infection nor to guide or monitor treatment.   Aerobic/Anaerobic Culture (surgical/deep wound)     Status: None (Preliminary result)   Collection Time: 04/08/17 10:24 AM  Result Value Ref Range Status   Specimen Description WOUND LEFT LOWER LEG  Final   Special Requests NONE  Final   Gram Stain    Final    RARE WBC PRESENT, PREDOMINANTLY PMN RARE GRAM NEGATIVE RODS    Culture CULTURE REINCUBATED FOR BETTER GROWTH  Final   Report Status PENDING  Incomplete    Radiology Reports Dg Tibia/fibula Left  Result Date: 04/01/2017 CLINICAL DATA:  Large open wound at mid lower leg possible infection, history diabetes mellitus, hypertension, CHF EXAM: LEFT TIBIA AND FIBULA - 2 VIEW COMPARISON:  03/25/2017 FINDINGS: Osseous demineralization. Hip and knee joint spaces preserved. No acute fracture, dislocation or bone destruction. Scattered soft tissue swelling LEFT lower leg with large area of soft tissue irregularity and ulceration at the anterior aspect of the mid lower leg. No definite associated radiopaque foreign bodies or underlying osseous changes. IMPRESSION: Soft tissue changes without acute osseous abnormalities. Electronically Signed   By: Lavonia Dana M.D.   On: 04/01/2017 13:07   Dg Tibia/fibula Left  Result Date: 03/25/2017 CLINICAL DATA:  Nonhealing wound in the anterior left lower extremity. EXAM: LEFT TIBIA AND FIBULA - 2 VIEW COMPARISON:  None. FINDINGS: Soft swelling is present anterior to the tibia. There is no underlying osseous abnormality. No radiopaque foreign body is present. The knee and ankle joints are unremarkable. IMPRESSION: 1. Soft tissue swelling anterior to the tibia without underlying fracture or osseous abnormality. Electronically Signed   By: San Morelle M.D.   On: 03/25/2017 17:23   Ct Head Wo Contrast  Result Date: 04/05/2017 CLINICAL DATA:  Sudden onset of confusion and neck pain EXAM: CT HEAD WITHOUT CONTRAST CT CERVICAL SPINE WITHOUT CONTRAST TECHNIQUE: Multidetector CT imaging of the head and cervical spine was performed following the standard  protocol without intravenous contrast. Multiplanar CT image reconstructions of the cervical spine were also generated. COMPARISON:  None. FINDINGS: CT HEAD FINDINGS Brain: No acute territorial infarction,  hemorrhage, on or intracranial mass is visualized. Moderate small vessel ischemic changes of the white matter. Moderate atrophy. Nonenlarged ventricles. Vascular: No hyperdense vessels. Vertebral artery and carotid artery calcification Skull: No fracture.  Small osteoma frontal bone. Sinuses/Orbits: No acute abnormality. Other: None CT CERVICAL SPINE FINDINGS Alignment: Trace anterolisthesis of C3 on C4 and C4 on C5. Straightening of the cervical spine. Facet alignment within normal limits Skull base and vertebrae: No acute fracture. No primary bone lesion or focal pathologic process. Soft tissues and spinal canal: No prevertebral fluid or swelling. No visible canal hematoma. Disc levels: Advanced arthritis at C5-C6. Moderate severe degenerative changes at C4-C5 and C6-C7. Upper chest: Apical emphysema. Probable subcentimeter hypodense nodule in the right lobe of thyroid Other: None IMPRESSION: 1. No CT evidence for acute intracranial abnormality. Atrophy and small vessel ischemic changes of the white matter 2. Trace anterolisthesis of C3 on C4. Degenerative changes most marked at C5-C6. No acute osseous abnormality 3. Apical emphysema Electronically Signed   By: Donavan Foil M.D.   On: 04/05/2017 02:35   Ct Angio Chest Pe W Or Wo Contrast  Result Date: 04/08/2017 CLINICAL DATA:  Shortness of breath, orthopnea, back pain, recent surgery for leg wound, elevated D-dimer question pulmonary embolism, history diabetes mellitus, hypertension, CHF, COPD EXAM: CT ANGIOGRAPHY CHEST WITH CONTRAST TECHNIQUE: Multidetector CT imaging of the chest was performed using the standard protocol during bolus administration of intravenous contrast. Multiplanar CT image reconstructions and MIPs were obtained to evaluate the vascular anatomy. CONTRAST:  28m ISOVUE-370 IOPAMIDOL (ISOVUE-370) INJECTION 76% IV COMPARISON:  Noncontrast CT chest 02/21/2016 FINDINGS: Cardiovascular: Atherosclerotic calcifications aorta, proximal great  vessels, and coronary arteries. Markedly tortuous innominate artery. Aorta normal caliber without aneurysm or dissection. Minimally dilated central pulmonary arteries. Pulmonary arteries well opacified and patent. No evidence of pulmonary embolism. No pericardial effusion. Mediastinum/Nodes: Chronic elevation of LEFT diaphragm. Esophagus unremarkable. 9 mm RIGHT thyroid nodule. Few normal sized inferior cervical lymph nodes bilaterally. No intrathoracic adenopathy. Lungs/Pleura: LEFT lower lobe atelectasis. Small LEFT pleural effusion. Minimal atelectasis at RIGHT lung base. No definite infiltrate or pneumothorax. Upper Abdomen: Question gastric wall thickening versus artifact from underdistention. Remaining visualized upper abdomen unremarkable. Musculoskeletal: Bones demineralized with dextroconvex thoracic scoliosis. Review of the MIP images confirms the above findings. IMPRESSION: No evidence of  pulmonary embolism. Enlargement of central pulmonary arteries question pulmonary artery hypertension. Chronic elevation of LEFT diaphragm with small LEFT pleural effusion and complete LEFT lower lobe atelectasis. Questionable gastric wall thickening versus artifact underdistention. Extensive atherosclerotic calcifications. Aortic Atherosclerosis (ICD10-I70.0). Electronically Signed   By: MLavonia DanaM.D.   On: 04/08/2017 17:59   Ct Cervical Spine Wo Contrast  Result Date: 04/05/2017 CLINICAL DATA:  Sudden onset of confusion and neck pain EXAM: CT HEAD WITHOUT CONTRAST CT CERVICAL SPINE WITHOUT CONTRAST TECHNIQUE: Multidetector CT imaging of the head and cervical spine was performed following the standard protocol without intravenous contrast. Multiplanar CT image reconstructions of the cervical spine were also generated. COMPARISON:  None. FINDINGS: CT HEAD FINDINGS Brain: No acute territorial infarction, hemorrhage, on or intracranial mass is visualized. Moderate small vessel ischemic changes of the white matter.  Moderate atrophy. Nonenlarged ventricles. Vascular: No hyperdense vessels. Vertebral artery and carotid artery calcification Skull: No fracture.  Small osteoma frontal bone. Sinuses/Orbits: No acute abnormality. Other: None CT CERVICAL SPINE FINDINGS Alignment:  Trace anterolisthesis of C3 on C4 and C4 on C5. Straightening of the cervical spine. Facet alignment within normal limits Skull base and vertebrae: No acute fracture. No primary bone lesion or focal pathologic process. Soft tissues and spinal canal: No prevertebral fluid or swelling. No visible canal hematoma. Disc levels: Advanced arthritis at C5-C6. Moderate severe degenerative changes at C4-C5 and C6-C7. Upper chest: Apical emphysema. Probable subcentimeter hypodense nodule in the right lobe of thyroid Other: None IMPRESSION: 1. No CT evidence for acute intracranial abnormality. Atrophy and small vessel ischemic changes of the white matter 2. Trace anterolisthesis of C3 on C4. Degenerative changes most marked at C5-C6. No acute osseous abnormality 3. Apical emphysema Electronically Signed   By: Donavan Foil M.D.   On: 04/05/2017 02:35   Dg Chest Port 1 View  Result Date: 04/08/2017 CLINICAL DATA:  Worsening oxygen saturation. EXAM: PORTABLE CHEST 1 VIEW COMPARISON:  04/01/2017.  07/30/2015. FINDINGS: Large chronic diaphragmatic hernia on the left containing multiple loops of intestine with left lower lung volume loss. Right lung is clear except for mild chronic increased markings. No sign of heart failure or effusion. No acute bone finding. 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 Chronic volume loss in the left lower lung secondary to that. No acute finding identified otherwise. Electronically Signed   By: Nelson Chimes M.D.   On: 04/08/2017 15:54   Dg Chest Portable 1 View  Result Date: 04/01/2017 CLINICAL DATA:  Large open wound at mid LEFT lower leg EXAM: PORTABLE CHEST 1 VIEW  COMPARISON:  Portable exam 1242 hours compared to 07/30/2015 FINDINGS: Stable heart size. Large LEFT diaphragmatic versus hiatal hernia again identified. Mediastinal contours and pulmonary vascularity otherwise normal. Chronic lung changes at the bases including LEFT basilar atelectasis and scarring at minor fissure. No definite infiltrate, pleural effusion or pneumothorax. Bones demineralized. Scattered atherosclerotic calcifications aorta. IMPRESSION: Large LEFT diaphragmatic versus hiatal hernia. Bronchitic changes with chronic LEFT basilar atelectasis and scarring at minor fissure. No acute infiltrate. Electronically Signed   By: Lavonia Dana M.D.   On: 04/01/2017 13:09    Lab Data:  CBC: Recent Labs  Lab 04/04/17 0603 04/04/17 2248 04/06/17 0459 04/07/17 0603 04/08/17 0537  WBC 21.4*  --  15.8* 13.5* 16.6*  NEUTROABS 19.0*  --  13.9*  --   --   HGB 12.7  --  12.8 12.7 12.7  HCT 39.6 39.6 40.0 40.1 39.2  MCV 96.6  --  97.6 97.8 96.6  PLT 328  --  335 325 341   Basic Metabolic Panel: Recent Labs  Lab 04/04/17 0603 04/06/17 0459 04/08/17 0537  NA 135 134* 134*  K 3.7 4.4 4.4  CL 95* 94* 91*  CO2 33* 35* 35*  GLUCOSE 69 122* 113*  BUN 24* 40* 43*  CREATININE 0.79 0.73 0.70  CALCIUM 8.4* 8.7* 8.1*  MG 1.7  --   --   PHOS 2.6 2.3*  --    GFR: Estimated Creatinine Clearance: 37.5 mL/min (by C-G formula based on SCr of 0.7 mg/dL). Liver Function Tests: Recent Labs  Lab 04/04/17 0603 04/06/17 0459  AST 19  --   ALT 15  --   ALKPHOS 159*  --   BILITOT 1.5*  --   PROT 4.8*  --   ALBUMIN 1.8* 1.6*   No results for input(s): LIPASE, AMYLASE in the last 168 hours. No results for input(s): AMMONIA in the last 168 hours. Coagulation Profile: No results  for input(s): INR, PROTIME in the last 168 hours. Cardiac Enzymes: No results for input(s): CKTOTAL, CKMB, CKMBINDEX, TROPONINI in the last 168 hours. BNP (last 3 results) No results for input(s): PROBNP in the last 8760  hours. HbA1C: No results for input(s): HGBA1C in the last 72 hours. CBG: Recent Labs  Lab 04/09/17 1154 04/09/17 1749 04/09/17 2052 04/10/17 0531 04/10/17 0549  GLUCAP 136* 153* 123* 68 85   Lipid Profile: No results for input(s): CHOL, HDL, LDLCALC, TRIG, CHOLHDL, LDLDIRECT in the last 72 hours. Thyroid Function Tests: No results for input(s): TSH, T4TOTAL, FREET4, T3FREE, THYROIDAB in the last 72 hours. Anemia Panel: No results for input(s): VITAMINB12, FOLATE, FERRITIN, TIBC, IRON, RETICCTPCT in the last 72 hours. Urine analysis:    Component Value Date/Time   COLORURINE YELLOW 04/01/2017 1820   APPEARANCEUR CLEAR 04/01/2017 1820   LABSPEC 1.017 04/01/2017 1820   PHURINE 5.0 04/01/2017 1820   GLUCOSEU NEGATIVE 04/01/2017 1820   HGBUR NEGATIVE 04/01/2017 1820   BILIRUBINUR NEGATIVE 04/01/2017 1820   KETONESUR NEGATIVE 04/01/2017 1820   PROTEINUR NEGATIVE 04/01/2017 1820   NITRITE NEGATIVE 04/01/2017 1820   LEUKOCYTESUR NEGATIVE 04/01/2017 1820     Ripudeep Rai M.D. [facility] 04/10/2017, 11:41 AM  Pager: [phone] Between 7am to 7pm - call Pager - [phone]  After 7pm go to [email or web address] - password [email or web address]  Call night coverage person covering after 7pm

## 2017-04-10 NOTE — Progress Notes (Signed)
Nutrition Follow-up  DOCUMENTATION CODES:   Severe malnutrition in context of social or environmental circumstances  INTERVENTION:   -Continue Ensure Enlive po BID, each supplement provides 350 kcal and 20 grams of protein -Continue MVI daily  NUTRITION DIAGNOSIS:   Severe Malnutrition related to social / environmental circumstances as evidenced by severe fat depletion, severe muscle depletion.  Ongoing  GOAL:   Patient will meet greater than or equal to 90% of their needs  Progressing  MONITOR:   PO intake, Supplement acceptance, Weight trends, Labs, Skin  REASON FOR ASSESSMENT:   Consult Assessment of nutrition requirement/status  ASSESSMENT:   83 y.o. female with medical history significant of DM, HTN, depression, and recent LE wound and other comorbid conditions. She was discharged on 11/23 with the diagnosis of left leg hematoma and surrounding infection. Initially she was improved but then began to worsen. When her wound covering was removed, the wound was covered in cat hair and flying gnats. Patient reports she has seen insects crawling inside her wound. In the ER, her leg had erythema with areas of black necrotic tissue with shiny moving areas that appear river-like.  When patient arrived into the ER, she was hypotensive and code sepsis was called. X-rays were obtained to look for osteomyelitis and subcutaneous gas both of which were negative. Plastic surgery consulted and pt debating options. Plastic Surgery recommending Hydrotherapy and Santyl for now.  12/3, 12/4- s/p hydrotherapy treatments 12/5- s/p Procedure(s):IRRIGATION AND DEBRIDEMENT EXTREMITY (Left); APPLICATION OF A-CELL OF EXTREMITY (Left); APPLICATION OF WOUND VAC (Left)   Pt sleeping soundly at time of visit. RD did not disturb.   Meal completion is variable; PO: 25-100% (averaging around 50% of meals). Pt is also taking Ensure supplements as ordered (noted empty bottle at bedside).   Per CSW note,  pt with bed available at [facility].   Labs reviewed: CBGS: 68-153 (inpatient orders for glycemic control are 0-5 units insulin aspart q HS, 0-9 units insulun aspart TID with meals).   Diet Order:  Diet Carb Modified Fluid consistency: Thin; Room service appropriate? Yes  EDUCATION NEEDS:   No education needs have been identified at this time  Skin:  Skin Assessment: Skin Integrity Issues: Skin Integrity Issues:: Stage II, Wound VAC Stage II: sacrum x 2 Wound Vac: lt leg with wound vac Diabetic Ulcer: lt leg with wound vac, s/p I&D  Last BM:  04/09/17  Height:   Ht Readings from Last 1 Encounters:  04/08/17 5' (1.524 m)    Weight:   Wt Readings from Last 1 Encounters:  04/08/17 95 lb (43.1 kg)    Ideal Body Weight:  45.45 kg  BMI:  Body mass index is 18.55 kg/m.  Estimated Nutritional Needs:   Kcal:  1295-1510 (30-35 kcal/kg)  Protein:  55-65 grams  Fluid:  >/= 1.5 L/day    Cyndal Kasson A. Jimmye Norman, RD, LDN, CDE Pager: [phone] After hours Pager: [phone]

## 2017-04-10 NOTE — Care Management (Signed)
Case manager spoke with Dr. Marla Roe concerning patient's discharge needs. Patient will require a [employer] Vac. Case Manager contacted Wound nurse, Nettie Elm at [phone], she assured me that they will arrange for [employer] wound vac. Case manager also notified Rickie Toye with [employer].

## 2017-04-10 NOTE — Consult Note (Addendum)
[facility]ious [facility]    Date of Admission:  04/01/2017   Total days of antibiotics: 9 (aztreonam/flagyl)               Reason for Consult: Dm foot infection    Referring Provider: Rai   Assessment: DM foot infection DM2 (pt denies) Sever protein calorie malnutrition (Alb 1.6)   Plan: 1) change anbx to ceftriaxone/flagyl 2) check Hgb A1C 3) plan for 6 weeks of anbx based on size of wound. Plain films are insensitive for osteomyelitis.  4) she will be d/c to [facility]. Her PCP there can f/u her anbx.   Available as needed.   Thank you so much for this interesting consult,  Principal Problem:   Left leg cellulitis Active Problems:   Hypertension   COPD (chronic obstructive pulmonary disease) (HCC)   Diabetes mellitus ([place])   Pulmonary hypertension ([place])   Infestation by fly larvae   Pressure injury of skin   Chronic diastolic CHF (congestive heart failure) ([place])   Severe malnutrition ([place])   . allopurinol  200 mg Oral Daily  . collagenase   Topical Daily  . feeding supplement (ENSURE ENLIVE)  237 mL Oral BID BM  . furosemide  40 mg Oral Daily  . insulin aspart  0-5 Units Subcutaneous QHS  . insulin aspart  0-9 Units Subcutaneous TID WC  . metroNIDAZOLE  500 mg Oral Q8H  . mometasone-formoterol  2 puff Inhalation BID  . multivitamin with minerals  1 tablet Oral Daily  . polyvinyl alcohol  1 drop Both Eyes BID  . sildenafil  20 mg Oral TID    HPI: Angel Estrada is a 83 y.o. female with hx of DM2 (she denies) seen in hospital 11-21 to 11-23 for LLE wound with surrounding infection. She was d/c home with doxy. She states she originally had the wound 2 years ago.   She returned to hospital on 11-28 and was found to have insects in her wound as well as cat hair.  She was found to have necrotic tissue. She qualified as "sepsis" (WBC 18.6, lactate 3.92)) and was started on flagyl/vanco/aztreonam.  She was eval by WOC. She initially declined  surgery but then agreed. She was seen by plastics and on 12-5 underwent  I & D, acell. Her Cx is showing skin flora, pending. Her post-op course was complicated by hypoxia (CTA -).    Review of Systems: Review of Systems  Constitutional: Negative for chills and fever.  Respiratory: Negative for cough and shortness of breath.   Gastrointestinal: Negative for constipation and diarrhea.  Genitourinary: Negative for dysuria.  Please see HPI. All other systems reviewed and negative.   Past Medical History:  Diagnosis Date  . CHF (congestive heart failure) ([place])   . COPD (chronic obstructive pulmonary disease) ([place])   . Depression   . Diabetes mellitus without complication ([place])   . Hypertension   . Osteoporosis     Social History   Tobacco Use  . Smoking status: Never Smoker  . Smokeless tobacco: Never Used  Substance Use Topics  . Alcohol use: No  . Drug use: No    Family History  Problem Relation Age of Onset  . Diabetes type II Mother   . Hypertension Mother   . Brain cancer Father   . Hypertension Father   . Diabetes type II Brother   . Stroke Other      Medications:  Scheduled: . allopurinol  200 mg Oral Daily  . collagenase   Topical Daily  . feeding supplement (ENSURE ENLIVE)  237 mL Oral BID BM  . furosemide  40 mg Oral Daily  . insulin aspart  0-5 Units Subcutaneous QHS  . insulin aspart  0-9 Units Subcutaneous TID WC  . metroNIDAZOLE  500 mg Oral Q8H  . mometasone-formoterol  2 puff Inhalation BID  . multivitamin with minerals  1 tablet Oral Daily  . polyvinyl alcohol  1 drop Both Eyes BID  . sildenafil  20 mg Oral TID    Abtx:  Anti-infectives (From admission, onward)   Start     Dose/Rate Route Frequency Ordered Stop   04/08/17 1030  polymyxin B 500,000 Units, bacitracin 50,000 Units in sodium chloride 0.9 % 500 mL irrigation  Status:  Discontinued       As needed 04/08/17 1030 04/08/17 1048   04/08/17 0815  ciprofloxacin (CIPRO) IVPB 400 mg      400 mg 200 mL/hr over 60 Minutes Intravenous On call to O.R. 04/08/17 0809 04/08/17 1010   04/05/17 1719  vancomycin (VANCOCIN) IVPB 1000 mg/200 mL premix  Status:  Discontinued     1,000 mg 200 mL/hr over 60 Minutes Intravenous Every 24 hours 04/05/17 1719 04/09/17 1402   04/03/17 1400  metroNIDAZOLE (FLAGYL) tablet 500 mg     500 mg Oral Every 8 hours 04/03/17 0955     04/02/17 1600  vancomycin (VANCOCIN) IVPB 750 mg/150 ml premix  Status:  Discontinued     750 mg 150 mL/hr over 60 Minutes Intravenous Every 24 hours 04/01/17 1819 04/05/17 1719   04/02/17 0945  metroNIDAZOLE (FLAGYL) IVPB 500 mg  Status:  Discontinued     500 mg 100 mL/hr over 60 Minutes Intravenous Every 8 hours 04/02/17 0935 04/03/17 0955   04/01/17 2200  aztreonam (AZACTAM) 1 g in dextrose 5 % 50 mL IVPB     1 g 100 mL/hr over 30 Minutes Intravenous Every 8 hours 04/01/17 1819     04/01/17 1215  aztreonam (AZACTAM) 2 g in dextrose 5 % 50 mL IVPB     2 g 100 mL/hr over 30 Minutes Intravenous  Once 04/01/17 1208 04/01/17 1344   04/01/17 1215  metroNIDAZOLE (FLAGYL) IVPB 500 mg     500 mg 100 mL/hr over 60 Minutes Intravenous  Once 04/01/17 1208 04/01/17 1445   04/01/17 1215  vancomycin (VANCOCIN) IVPB 1000 mg/200 mL premix     1,000 mg 200 mL/hr over 60 Minutes Intravenous  Once 04/01/17 1208 04/01/17 1603        OBJECTIVE: Blood pressure 97/67, pulse 85, temperature 97.8 F (36.6 C), temperature source Oral, resp. rate 14, height 5' (1.524 m), weight 43.1 kg (95 lb), SpO2 94 %.  Physical Exam  Constitutional: She appears cachectic.  HENT:  Mouth/Throat: No oropharyngeal exudate.  Eyes: EOM are normal. Pupils are equal, round, and reactive to light.  Neck: Neck supple.  Cardiovascular: Tachycardia present.  Pulmonary/Chest: Effort normal and breath sounds normal.  Abdominal: Soft. Bowel sounds are normal. There is no tenderness. There is no rebound.  Musculoskeletal: She exhibits no edema.    Lymphadenopathy:    She has no cervical adenopathy.  Skin:     Psychiatric: Affect normal. Her affect is not inappropriate. She is not agitated.  A & O  X3, president- "you don't want to know"    Lab Results Results for orders placed or performed during the hospital encounter of 04/01/17 (from the past 48  hour(s))  Glucose, capillary     Status: Abnormal   Collection Time: 04/08/17  6:02 PM  Result Value Ref Range   Glucose-Capillary 105 (H) 65 - 99 mg/dL  Glucose, capillary     Status: Abnormal   Collection Time: 04/08/17  9:22 PM  Result Value Ref Range   Glucose-Capillary 160 (H) 65 - 99 mg/dL  Glucose, capillary     Status: Abnormal   Collection Time: 04/09/17  6:26 AM  Result Value Ref Range   Glucose-Capillary 111 (H) 65 - 99 mg/dL  Glucose, capillary     Status: Abnormal   Collection Time: 04/09/17 11:54 AM  Result Value Ref Range   Glucose-Capillary 136 (H) 65 - 99 mg/dL  Glucose, capillary     Status: Abnormal   Collection Time: 04/09/17  5:49 PM  Result Value Ref Range   Glucose-Capillary 153 (H) 65 - 99 mg/dL  Glucose, capillary     Status: Abnormal   Collection Time: 04/09/17  8:52 PM  Result Value Ref Range   Glucose-Capillary 123 (H) 65 - 99 mg/dL  Glucose, capillary     Status: None   Collection Time: 04/10/17  5:31 AM  Result Value Ref Range   Glucose-Capillary 68 65 - 99 mg/dL  Glucose, capillary     Status: None   Collection Time: 04/10/17  5:49 AM  Result Value Ref Range   Glucose-Capillary 85 65 - 99 mg/dL  Glucose, capillary     Status: Abnormal   Collection Time: 04/10/17 11:52 AM  Result Value Ref Range   Glucose-Capillary 181 (H) 65 - 99 mg/dL      Component Value Date/Time   SDES WOUND LEFT LOWER LEG 04/08/2017 1024   SPECREQUEST NONE 04/08/2017 1024   CULT  04/08/2017 1024    NORMAL SKIN FLORA NO ANAEROBES ISOLATED; CULTURE IN PROGRESS FOR 5 DAYS    REPTSTATUS PENDING 04/08/2017 1024   Ct Angio Chest Pe W Or Wo Contrast  Result  Date: 04/08/2017 CLINICAL DATA:  Shortness of breath, orthopnea, back pain, recent surgery for leg wound, elevated D-dimer question pulmonary embolism, history diabetes mellitus, hypertension, CHF, COPD EXAM: CT ANGIOGRAPHY CHEST WITH CONTRAST TECHNIQUE: Multidetector CT imaging of the chest was performed using the standard protocol during bolus administration of intravenous contrast. Multiplanar CT image reconstructions and MIPs were obtained to evaluate the vascular anatomy. CONTRAST:  63m ISOVUE-370 IOPAMIDOL (ISOVUE-370) INJECTION 76% IV COMPARISON:  Noncontrast CT chest 02/21/2016 FINDINGS: Cardiovascular: Atherosclerotic calcifications aorta, proximal great vessels, and coronary arteries. Markedly tortuous innominate artery. Aorta normal caliber without aneurysm or dissection. Minimally dilated central pulmonary arteries. Pulmonary arteries well opacified and patent. No evidence of pulmonary embolism. No pericardial effusion. Mediastinum/Nodes: Chronic elevation of LEFT diaphragm. Esophagus unremarkable. 9 mm RIGHT thyroid nodule. Few normal sized inferior cervical lymph nodes bilaterally. No intrathoracic adenopathy. Lungs/Pleura: LEFT lower lobe atelectasis. Small LEFT pleural effusion. Minimal atelectasis at RIGHT lung base. No definite infiltrate or pneumothorax. Upper Abdomen: Question gastric wall thickening versus artifact from underdistention. Remaining visualized upper abdomen unremarkable. Musculoskeletal: Bones demineralized with dextroconvex thoracic scoliosis. Review of the MIP images confirms the above findings. IMPRESSION: No evidence of  pulmonary embolism. Enlargement of central pulmonary arteries question pulmonary artery hypertension. Chronic elevation of LEFT diaphragm with small LEFT pleural effusion and complete LEFT lower lobe atelectasis. Questionable gastric wall thickening versus artifact underdistention. Extensive atherosclerotic calcifications. Aortic Atherosclerosis (ICD10-I70.0).  Electronically Signed   By: MLavonia DanaM.D.   On: 04/08/2017 17:59   Recent Results (  from the past 240 hour(s))  Blood Culture (routine x 2)     Status: None   Collection Time: 04/01/17 12:39 PM  Result Value Ref Range Status   Specimen Description BLOOD RIGHT ANTECUBITAL  Final   Special Requests IN PEDIATRIC BOTTLE Blood Culture adequate volume  Final   Culture NO GROWTH 5 DAYS  Final   Report Status 04/06/2017 FINAL  Final  Blood Culture (routine x 2)     Status: None   Collection Time: 04/01/17 12:45 PM  Result Value Ref Range Status   Specimen Description BLOOD LEFT ANTECUBITAL  Final   Special Requests IN PEDIATRIC BOTTLE Blood Culture adequate volume  Final   Culture NO GROWTH 5 DAYS  Final   Report Status 04/06/2017 FINAL  Final  Surgical pcr screen     Status: None   Collection Time: 04/08/17  5:17 AM  Result Value Ref Range Status   MRSA, PCR NEGATIVE NEGATIVE Final   Staphylococcus aureus NEGATIVE NEGATIVE Final    Comment: (NOTE) The Xpert SA Assay (FDA approved for NASAL specimens in patients 15 years of age and older), is one component of a comprehensive surveillance program. It is not intended to diagnose infection nor to guide or monitor treatment.   Aerobic/Anaerobic Culture (surgical/deep wound)     Status: None (Preliminary result)   Collection Time: 04/08/17 10:24 AM  Result Value Ref Range Status   Specimen Description WOUND LEFT LOWER LEG  Final   Special Requests NONE  Final   Gram Stain   Final    RARE WBC PRESENT, PREDOMINANTLY PMN RARE GRAM NEGATIVE RODS    Culture   Final    NORMAL SKIN FLORA NO ANAEROBES ISOLATED; CULTURE IN PROGRESS FOR 5 DAYS    Report Status PENDING  Incomplete    Microbiology: Recent Results (from the past 240 hour(s))  Blood Culture (routine x 2)     Status: None   Collection Time: 04/01/17 12:39 PM  Result Value Ref Range Status   Specimen Description BLOOD RIGHT ANTECUBITAL  Final   Special Requests IN PEDIATRIC  BOTTLE Blood Culture adequate volume  Final   Culture NO GROWTH 5 DAYS  Final   Report Status 04/06/2017 FINAL  Final  Blood Culture (routine x 2)     Status: None   Collection Time: 04/01/17 12:45 PM  Result Value Ref Range Status   Specimen Description BLOOD LEFT ANTECUBITAL  Final   Special Requests IN PEDIATRIC BOTTLE Blood Culture adequate volume  Final   Culture NO GROWTH 5 DAYS  Final   Report Status 04/06/2017 FINAL  Final  Surgical pcr screen     Status: None   Collection Time: 04/08/17  5:17 AM  Result Value Ref Range Status   MRSA, PCR NEGATIVE NEGATIVE Final   Staphylococcus aureus NEGATIVE NEGATIVE Final    Comment: (NOTE) The Xpert SA Assay (FDA approved for NASAL specimens in patients 48 years of age and older), is one component of a comprehensive surveillance program. It is not intended to diagnose infection nor to guide or monitor treatment.   Aerobic/Anaerobic Culture (surgical/deep wound)     Status: None (Preliminary result)   Collection Time: 04/08/17 10:24 AM  Result Value Ref Range Status   Specimen Description WOUND LEFT LOWER LEG  Final   Special Requests NONE  Final   Gram Stain   Final    RARE WBC PRESENT, PREDOMINANTLY PMN RARE GRAM NEGATIVE RODS    Culture   Final  NORMAL SKIN FLORA NO ANAEROBES ISOLATED; CULTURE IN PROGRESS FOR 5 DAYS    Report Status PENDING  Incomplete    Radiographs and labs were personally reviewed by me.   Bobby Rumpf, MD [facility] [phone] 04/10/2017, 4:41 PM  Diagnosis: Diabetic wound infection  Culture Result: polymicrobial  Allergies  Allergen Reactions  . Penicillins Swelling and Rash    Site of swelling (?) Has patient had a PCN reaction causing immediate rash, facial/tongue/throat swelling, SOB or lightheadedness with hypotension: Yes Has patient had a PCN reaction causing severe rash involving mucus membranes or skin necrosis: No Has  patient had a PCN reaction that required hospitalization: No Has patient had a PCN reaction occurring within the last 10 years: No If all of the above answers are "NO", then may proceed with Cephalosporin use.   Marland Kitchen Lisinopril-Hydrochlorothiazide Other (See Comments) and Cough    Pt's PCP took her off medication because of possible kidney damage  . Sulfa Antibiotics Other (See Comments)    Unknown reaction-- childhood allergy   . Latex Rash    OPAT Orders Discharge antibiotics: Ceftriaxone 2g ivpb qday Flagyl 531m q8h po  Duration: 33 days End Date: 05-14-17  [facility]  Labs weekly while on IV antibiotics: _x_ CBC with differential __ BMP _x_ CMP _x_ CRP _x_ ESR __ Vancomycin trough  _x_ Please pull PIC at completion of IV antibiotics __ Please leave PIC in place until doctor has seen patient or been notified  Fax weekly labs to ([phone] Clinic Follow Up Appt: PCP

## 2017-04-10 NOTE — Progress Notes (Signed)
CBG at 2200  -65 Snacks are given CBG re-check- 165

## 2017-04-10 NOTE — Progress Notes (Signed)
Started Rocephin Abx @ 5462 and have monitored pt- denies any trouble breathing, or rash. Pt is more confused this afternoon and states that she feels like she is not in the bed when she is in the bed. Pt is alert and oriented to self, place and year but continues to make comments like "where are my feet" or "I have to go". Pt has been reoriented.

## 2017-04-10 NOTE — Progress Notes (Signed)
PHARMACY CONSULT NOTE FOR:  OUTPATIENT  PARENTERAL ANTIBIOTIC THERAPY (OPAT)  Indication: osteomyelitis Regimen: Ceftriaxone 2 gm IV every 24 hours and Flagyl 500 mg po every 8 hours.  End date: 05/14/17  *Note PCN allergy of swelling and rash. Discussed with patient and Dr. Johnnye Sima- will to try and observe for any reaction while inpatient.   IV antibiotic discharge orders are pended. To discharging provider:  please sign these orders via discharge navigator,  Select New Orders & click on the button choice - Manage This Unsigned Work.     Thank you for allowing pharmacy to be a part of this patient's care.  Sloan Leiter, PharmD, BCPS, BCCCP Clinical Pharmacist Clinical phone 04/10/2017 until 11PM [phone] After hours, please call #[phone] 04/10/2017, 6:04 PM

## 2017-04-10 NOTE — Care Management (Signed)
1500 CM received call from [employer], [employer], she said that [facility] will deliver patient's wound vac to  Her room here at the hospital. It will need to be applied to patient and go with her to [place]. CM notified patient's bedside RN.

## 2017-04-10 NOTE — Social Work (Signed)
Pt has a SNF bed at [facility].  CSW will continue to follow for disposition.  Elissa Hefty, LCSW Clinical Social Worker [phone]

## 2017-04-11 ENCOUNTER — Inpatient Hospital Stay (HOSPITAL_COMMUNITY): Payer: Medicare Other

## 2017-04-11 LAB — BASIC METABOLIC PANEL
ANION GAP: 9 (ref 5–15)
BUN: 49 mg/dL — ABNORMAL HIGH (ref 6–20)
CALCIUM: 8.3 mg/dL — AB (ref 8.9–10.3)
CO2: 36 mmol/L — ABNORMAL HIGH (ref 22–32)
CREATININE: 0.73 mg/dL (ref 0.44–1.00)
Chloride: 86 mmol/L — ABNORMAL LOW (ref 101–111)
GFR calc non Af Amer: >60 mL/min (ref >60)
Glucose, Bld: 156 mg/dL — ABNORMAL HIGH (ref 65–99)
Potassium: 5.1 mmol/L (ref 3.5–5.1)
SODIUM: 131 mmol/L — AB (ref 135–145)

## 2017-04-11 LAB — CBC
HEMATOCRIT: 39.5 % (ref 36.0–46.0)
Hemoglobin: 13 g/dL (ref 12.0–15.0)
MCH: 31.5 pg (ref 26.0–34.0)
MCHC: 32.9 g/dL (ref 30.0–36.0)
MCV: 95.6 fL (ref 78.0–100.0)
PLATELETS: 514 10*3/uL — AB (ref 150–400)
RBC: 4.13 MIL/uL (ref 3.87–5.11)
RDW: 15.1 % (ref 11.5–15.5)
WBC: 18.6 10*3/uL — AB (ref 4.0–10.5)

## 2017-04-11 LAB — GLUCOSE, CAPILLARY
GLUCOSE-CAPILLARY: 180 mg/dL — AB (ref 65–99)
GLUCOSE-CAPILLARY: 158 mg/dL — AB (ref 65–99)

## 2017-04-11 MED ORDER — SODIUM CHLORIDE 0.9% FLUSH
10.0000 mL | INTRAVENOUS | Status: DC | PRN
  Administered 2017-04-11: 10 mL
  Filled 2017-04-11: qty 40

## 2017-04-11 MED ORDER — ADULT MULTIVITAMIN W/MINERALS CH: 1.0000 | ORAL_TABLET | Freq: Every day | ORAL | Status: DC

## 2017-04-11 MED ORDER — CEFTRIAXONE IV (FOR PTA / DISCHARGE USE ONLY): 2.0000 g | INTRAVENOUS | 0 refills | Status: DC

## 2017-04-11 MED ORDER — FUROSEMIDE 40 MG PO TABS: 40.0000 mg | ORAL_TABLET | Freq: Two times a day (BID) | ORAL | 0 refills | Status: DC

## 2017-04-11 MED ORDER — ENSURE ENLIVE PO LIQD: 237.0000 mL | Freq: Two times a day (BID) | ORAL | 12 refills | Status: AC

## 2017-04-11 MED ORDER — METRONIDAZOLE 500 MG PO TABS: 500.0000 mg | ORAL_TABLET | Freq: Three times a day (TID) | ORAL | 0 refills | Status: DC

## 2017-04-11 MED ORDER — INSULIN ASPART 100 UNIT/ML ~~LOC~~ SOLN: 0.0000 [IU] | Freq: Three times a day (TID) | SUBCUTANEOUS | 11 refills | Status: DC

## 2017-04-11 MED ORDER — HEPARIN SOD (PORK) LOCK FLUSH 100 UNIT/ML IV SOLN
250.0000 [IU] | INTRAVENOUS | Status: AC | PRN
  Administered 2017-04-11: 250 [IU]

## 2017-04-11 MED ORDER — ACETAMINOPHEN 325 MG PO TABS: 650.0000 mg | ORAL_TABLET | Freq: Four times a day (QID) | ORAL | Status: AC | PRN

## 2017-04-11 NOTE — Plan of Care (Signed)
Patient needs PICC line for IV antibiotics as recommended by ID physician, Dr Johnnye Sima for 6 weeks, end date 05/14/17. No POA to sign consent, patient understands the procedure when I explained but has intermittent confusion and dementia. IV team can place PICC line for medical necessity.      Angel Estrada M.D. [facility] 04/11/2017, 8:50 AM  Pager: [phone]

## 2017-04-11 NOTE — Progress Notes (Signed)
Report given to [facility]( [facility]).

## 2017-04-11 NOTE — Discharge Summary (Addendum)
Physician Discharge Summary   Patient ID: Angel Estrada MRN: [id number] DOB/AGE: Nov 27, 1933 83 y.o.  Admit date: 04/01/2017 Discharge date: 04/11/2017  Primary Care Physician:  Jani Gravel, MD  Discharge Diagnoses:    . Sepsis affecting skin ([place]) Left leg cellulitis Acute hypoxic respiratory failure in the setting of surgery postop, history of pulmonary hypertension . Hypertension . Pulmonary hypertension ([place]) . COPD (chronic obstructive pulmonary disease) (HCC) Diabetes mellitus Probable dementia Chronic diastolic CHF Severe protein calorie malnutrition   Consults:   Plastic surgery Infectious disease  Recommendations for Outpatient Follow-up:  1. Continue ceftriaxone and Flagyl for 6 weeks of antibiotics 2. Please repeat CBC/BMET at next visit 3. Follow hemoglobin A1c 4. PICC line placed on 12/8 5. Check CBC with differential, CMP, ESR, CRP weekly while on IV antibiotics.  Pull PICC line at the completion of IV antibiotics. 6. Continue O2 3 L via nasal cannula, wean as tolerated   DIET: Carb modified diet    Allergies:   Allergies  Allergen Reactions  . Penicillins Swelling and Rash    Site of swelling (?) Has patient had a PCN reaction causing immediate rash, facial/tongue/throat swelling, SOB or lightheadedness with hypotension: Yes Has patient had a PCN reaction causing severe rash involving mucus membranes or skin necrosis: No Has patient had a PCN reaction that required hospitalization: No Has patient had a PCN reaction occurring within the last 10 years: No If all of the above answers are "NO", then may proceed with Cephalosporin use.   Marland Kitchen Lisinopril-Hydrochlorothiazide Other (See Comments) and Cough    Pt's PCP took her off medication because of possible kidney damage  . Sulfa Antibiotics Other (See Comments)    Unknown reaction-- childhood allergy   . Latex Rash     DISCHARGE MEDICATIONS: Allergies as of 04/11/2017      Reactions   Penicillins  Swelling, Rash   Site of swelling (?) Has patient had a PCN reaction causing immediate rash, facial/tongue/throat swelling, SOB or lightheadedness with hypotension: Yes Has patient had a PCN reaction causing severe rash involving mucus membranes or skin necrosis: No Has patient had a PCN reaction that required hospitalization: No Has patient had a PCN reaction occurring within the last 10 years: No If all of the above answers are "NO", then may proceed with Cephalosporin use.   Lisinopril-hydrochlorothiazide Other (See Comments), Cough   Pt's PCP took her off medication because of possible kidney damage   Sulfa Antibiotics Other (See Comments)   Unknown reaction-- childhood allergy    Latex Rash      Medication List    STOP taking these medications   doxycycline 100 MG tablet Commonly known as:  VIBRA-TABS     TAKE these medications   acetaminophen 325 MG tablet Commonly known as:  TYLENOL Take 2 tablets (650 mg total) by mouth every 6 (six) hours as needed for mild pain (or Fever >/= 101).   alendronate 35 MG tablet Commonly known as:  FOSAMAX Take 35 mg by mouth every 7 (seven) days.   allopurinol 100 MG tablet Commonly known as:  ZYLOPRIM Take 200 mg by mouth daily.   BLINK TEARS 0.25 % Soln Generic drug:  Polyethylene Glycol 400 Place 1 drop into both eyes 2 (two) times daily. "BLINK"   budesonide-formoterol 160-4.5 MCG/ACT inhaler Commonly known as:  SYMBICORT Inhale 2 puffs into the lungs 2 (two) times daily.   cefTRIAXone IVPB Commonly known as:  ROCEPHIN Inject 2 g into the vein daily. Indication:  Osteomyelitis  Last Day of Therapy:  05/14/17 Labs - Once weekly:  CBC/D and BMP, Labs - Every other week:  ESR and CRP   feeding supplement (ENSURE ENLIVE) Liqd Take 237 mLs by mouth 2 (two) times daily between meals.   fluticasone 50 MCG/ACT nasal spray Commonly known as:  FLONASE Place 2 sprays into both nostrils daily as needed for allergies or rhinitis.    furosemide 40 MG tablet Commonly known as:  LASIX Take 1 tablet (40 mg total) by mouth 2 (two) times daily. What changed:    how much to take  when to take this  additional instructions   insulin aspart 100 UNIT/ML injection Commonly known as:  novoLOG Inject 0-9 Units into the skin 3 (three) times daily with meals. Sliding scale CBG 70 - 120: 0 units CBG 121 - 150: 1 unit,  CBG 151 - 200: 2 units,  CBG 201 - 250: 3 units,  CBG 251 - 300: 5 units,  CBG 301 - 350: 7 units,  CBG 351 - 400: 9 units   CBG > 400: 9 units and notify your MD   metroNIDAZOLE 500 MG tablet Commonly known as:  FLAGYL Take 1 tablet (500 mg total) by mouth 3 (three) times daily. Indication:  Osteomyelitis Last Day of Therapy:  05/14/17 Labs - Once weekly:  CBC/D and BMP, Labs - Every other week:  ESR and CRP   multivitamin with minerals Tabs tablet Take 1 tablet by mouth daily.   nystatin-triamcinolone cream Commonly known as:  MYCOLOG II Apply 1 application topically 2 (two) times daily as needed (rash). Reported on 07/30/2015   PROAIR HFA 108 (90 Base) MCG/ACT inhaler Generic drug:  albuterol Inhale 2 puffs into the lungs every 6 (six) hours as needed for wheezing or shortness of breath.   SALONPAS PAIN RELIEF PATCH EX Apply 1 patch topically daily as needed (PAIN).   sildenafil 20 MG tablet Commonly known as:  REVATIO Take 1 tablet (20 mg total) by mouth 3 (three) times daily.   SYSTANE ULTRA PF 0.4-0.3 % Soln Generic drug:  Polyethyl Glyc-Propyl Glyc PF Place 1 drop into both eyes 2 (two) times daily. "SYSTANE"   triamcinolone cream 0.1 % Commonly known as:  KENALOG Apply 1 application topically 2 (two) times daily as needed (skin irritation).            Home Infusion Instuctions  (From admission, onward)        Start     Ordered   04/11/17 0000  Home infusion instructions [facility] May follow [place] Dosing Protocol; May administer Cathflo as needed to maintain  patency of vascular access device.; Flushing of vascular access device: per [facility] Protocol: 0.9% NaCl pre/post medica...    Question Answer Comment  Instructions May follow [place] Dosing Protocol   Instructions May administer Cathflo as needed to maintain patency of vascular access device.   Instructions Flushing of vascular access device: per [facility] Protocol: 0.9% NaCl pre/post medication administration and prn patency; Heparin 100 u/ml, 11m for implanted ports and Heparin 10u/ml, 56mfor all other central venous catheters.   Instructions May follow [facility] Anaphylaxis Protocol for First Dose Administration in the home: 0.9% NaCl at 25-50 ml/hr to maintain IV access for protocol meds. Epinephrine 0.3 ml IV/IM PRN and Benadryl 25-50 IV/IM PRN s/s of anaphylaxis.   Instructions [facility] Infusion Coordinator (RN) to assist per patient IV care needs in the home PRN.      04/11/17 [id number]  Brief H and P: For complete details please refer to admission H and P, but in brief 83 year old female presented with left leg wound found to be infested with insects and contaminated with cat hair. Admitted for sepsis secondary to LLE wound cellulitis complicated by insect infestation.    Hospital Course:   Left leg cellulitis -Complicated wound with insect infestation, contaminated by cat hair -Blood cultures negative so far -Continue IV vancomycin, aztreonam, Flagyl -Plastic surgery was consulted, underwent debridement with wound VAC, postop day # 3. - Intraoperative cultures showed normal flora.  Blood cultures negative. -Per plastic surgery, continue wound VAC, outpatient follow-up with Dr. Marla Roe in 1 week -Infectious disease was consulted, recommended IV Rocephin and Flagyl for 6 weeks, end date on 05/14/17   Ac hypoxic respiratory failure in the setting of surgery post op, anesthesia, hx of PHTN: -After the surgery, patient had an episode of acute hypoxia.  CXR showed no acute  pathology or PNA except chronic left diaphramatic hernia. -CT angiogram ruled out PE, and BNP was slightly elevated 409.5, gave Lasix 20 mg x1, then placed on Lasix daily. -Patient has been weaned off off Ventimask, currently on 3 L O2 via nasal cannula sats 96% -Wean O2 as tolerated    Hypertension -Currently stable    COPD (chronic obstructive pulmonary disease) (HCC) -No acute wheezing, continue albuterol as needed, Dulera    Diabetes mellitus ([place]) -CBGs controlled, continue sliding scale insulin    Pulmonary hypertension (HCC) -Continue sildenafil    Chronic diastolic CHF (congestive heart failure) (HCC) -BNP 409, continue Lasix (at home on 80 mg in the morning, 40 mg in the p.m.)  - continue Lasix at 40 mg twice a day, increase to home dose once BP improves    Severe protein calorie malnutrition ([place]) Continue Ensure   Probable dementia Patient appears to have some underlying dementia CT head at the time of admission showed atrophy and small vessel ischemic changes of the white matter.  No acute abnormality.  During the hospitalization patient had intermittent confusion.    Day of Discharge BP 97/64 (BP Location: Right Arm)   Pulse 100   Temp 97.8 F (36.6 C) (Axillary)   Resp 16   Ht 5' (1.524 m)   Wt 43.1 kg (95 lb)   SpO2 96%   BMI 18.55 kg/m   Physical Exam: General: Alert and awake oriented, not in any acute distress. HEENT: anicteric sclera, pupils reactive to light and accommodation CVS: S1-S2 clear no murmur rubs or gallops Chest: clear to auscultation bilaterally, no wheezing rales or rhonchi Abdomen: soft nontender, nondistended, normal bowel sounds Extremities: Dressing intact on left leg Neuro: Cranial nerves II-XII intact, no focal neurological deficits   The results of significant diagnostics from this hospitalization (including imaging, microbiology, ancillary and laboratory) are listed below for reference.    LAB RESULTS: Basic  Metabolic Panel: Recent Labs  Lab 04/06/17 0459 04/08/17 0537 04/11/17 0701  NA 134* 134* 131*  K 4.4 4.4 5.1  CL 94* 91* 86*  CO2 35* 35* 36*  GLUCOSE 122* 113* 156*  BUN 40* 43* 49*  CREATININE 0.73 0.70 0.73  CALCIUM 8.7* 8.1* 8.3*  PHOS 2.3*  --   --    Liver Function Tests: Recent Labs  Lab 04/06/17 0459  ALBUMIN 1.6*   No results for input(s): LIPASE, AMYLASE in the last 168 hours. No results for input(s): AMMONIA in the last 168 hours. CBC: Recent Labs  Lab 04/06/17 0459  04/08/17 0537 04/11/17  0701  WBC 15.8*   < > 16.6* 18.6*  NEUTROABS 13.9*  --   --   --   HGB 12.8   < > 12.7 13.0  HCT 40.0   < > 39.2 39.5  MCV 97.6   < > 96.6 95.6  PLT 335   < > 376 514*   < > = values in this interval not displayed.   Cardiac Enzymes: No results for input(s): CKTOTAL, CKMB, CKMBINDEX, TROPONINI in the last 168 hours. BNP: Invalid input(s): POCBNP CBG: Recent Labs  Lab 04/11/17 0627 04/11/17 1142  GLUCAP 180* 158*    Significant Diagnostic Studies:  Dg Tibia/fibula Left  Result Date: 04/01/2017 CLINICAL DATA:  Large open wound at mid lower leg possible infection, history diabetes mellitus, hypertension, CHF EXAM: LEFT TIBIA AND FIBULA - 2 VIEW COMPARISON:  03/25/2017 FINDINGS: Osseous demineralization. Hip and knee joint spaces preserved. No acute fracture, dislocation or bone destruction. Scattered soft tissue swelling LEFT lower leg with large area of soft tissue irregularity and ulceration at the anterior aspect of the mid lower leg. No definite associated radiopaque foreign bodies or underlying osseous changes. IMPRESSION: Soft tissue changes without acute osseous abnormalities. Electronically Signed   By: Lavonia Dana M.D.   On: 04/01/2017 13:07   Dg Chest Portable 1 View  Result Date: 04/01/2017 CLINICAL DATA:  Large open wound at mid LEFT lower leg EXAM: PORTABLE CHEST 1 VIEW COMPARISON:  Portable exam 1242 hours compared to 07/30/2015 FINDINGS: Stable  heart size. Large LEFT diaphragmatic versus hiatal hernia again identified. Mediastinal contours and pulmonary vascularity otherwise normal. Chronic lung changes at the bases including LEFT basilar atelectasis and scarring at minor fissure. No definite infiltrate, pleural effusion or pneumothorax. Bones demineralized. Scattered atherosclerotic calcifications aorta. IMPRESSION: Large LEFT diaphragmatic versus hiatal hernia. Bronchitic changes with chronic LEFT basilar atelectasis and scarring at minor fissure. No acute infiltrate. Electronically Signed   By: Lavonia Dana M.D.   On: 04/01/2017 13:09    2D ECHO:   Disposition and Follow-up: Discharge Instructions    Diet Carb Modified   Complete by:  As directed    Home infusion instructions [facility] May follow [place] Dosing Protocol; May administer Cathflo as needed to maintain patency of vascular access device.; Flushing of vascular access device: per [facility] Protocol: 0.9% NaCl pre/post medica...   Complete by:  As directed    Instructions:  May follow [place] Dosing Protocol   Instructions:  May administer Cathflo as needed to maintain patency of vascular access device.   Instructions:  Flushing of vascular access device: per [facility] Protocol: 0.9% NaCl pre/post medication administration and prn patency; Heparin 100 u/ml, 30m for implanted ports and Heparin 10u/ml, 545mfor all other central venous catheters.   Instructions:  May follow [facility] Anaphylaxis Protocol for First Dose Administration in the home: 0.9% NaCl at 25-50 ml/hr to maintain IV access for protocol meds. Epinephrine 0.3 ml IV/IM PRN and Benadryl 25-50 IV/IM PRN s/s of anaphylaxis.   Instructions:  [facility] Coord[facility] (RN) to assist per patient IV care needs in the home PRN.   Increase activity slowly   Complete by:  As directed        [facility]     [facility]            Follow up in 2 week(s).   Contact  information: [address] [address] [place], [place], [place]. Schedule an appointment as soon as possible for a visit in 1 week(s).   Specialty:  Plastic Surgery Why:  post-op Contact information: [place] [address] [phone]            Contact information for after-discharge care    Destination    [facility] Follow up.   Service:  Skilled Nursing Contact information: [address] [place] [phone]                   Time spent on Discharge: 40-minute  Signed:   Estill Cotta M.D. [facility] 04/11/2017, 11:53 AM Pager: [phone]   Code query addendum Pressure injury, lower back, Stage II, Not POA Wound care by nursing   Estill Cotta M.D. [facility] 04/25/2017, 2:32 PM  Pager: [phone]

## 2017-04-11 NOTE — Progress Notes (Signed)
Clinical Social Worker facilitated patient discharge including contacting patient family and facility to confirm patient discharge plans.  Clinical information faxed to facility and family agreeable with plan.  CSW arranged ambulance transport via [facility] to [place].  RN to call [phone] (pt will go in room 212) for  report prior to discharge.  Clinical Social Worker will sign off for now as social work intervention is no longer needed. Please consult [place] again if new need arises.  Rhea Pink, MSW, [place]

## 2017-04-11 NOTE — Progress Notes (Signed)
PICC line in, will change vac to home vac but no canister found for the home vac. SW and CNM made aware, Awaiting for update.

## 2017-04-11 NOTE — Progress Notes (Signed)
[facility] transporting patient to [place].

## 2017-04-11 NOTE — Progress Notes (Signed)
Per radiology PICC line in the right arm but need to pull back 3 cm

## 2017-04-11 NOTE — Progress Notes (Signed)
Spoke with RN from nightshift and day shift.  States pt has been confused, agitated and possibly will not tolerate PICC placement.  No family at bedside.  S.O on chart, but unknown if POA/ abe to give consent.  RN to notify once consent is obtained or medical necessity is obtained.  Also concern re: pt ability to tolerate procedure under sterile drape due to agitation and confusion.

## 2017-04-11 NOTE — Progress Notes (Signed)
Peripherally Inserted Central Catheter/Midline Placement  The IV Nurse has discussed with the patient and/or persons authorized to consent for the patient, the purpose of this procedure and the potential benefits and risks involved with this procedure.  The benefits include less needle sticks, lab draws from the catheter, and the patient may be discharged home with the catheter. Risks include, but not limited to, infection, bleeding, blood clot (thrombus formation), and puncture of an artery; nerve damage and irregular heartbeat and possibility to perform a PICC exchange if needed/ordered by physician.  Alternatives to this procedure were also discussed.  Bard Power PICC patient education guide, fact sheet on infection prevention and patient information card has been provided to patient /or left at bedside.  Pt confused. Placed per medical necessity.  PICC/Midline Placement Documentation  PICC Single Lumen 04/11/17 PICC Right Brachial 34 cm 0 cm (Active)  Indication for Insertion or Continuance of Line Home intravenous therapies (PICC only) 04/11/2017 11:33 AM  Exposed Catheter (cm) 0 cm 04/11/2017 11:33 AM  Site Assessment Clean;Dry;Intact 04/11/2017 11:33 AM  Line Status Flushed;Saline locked;Blood return noted 04/11/2017 11:33 AM  Dressing Type Transparent 04/11/2017 11:33 AM  Dressing Status Clean;Dry;Intact;Antimicrobial disc in place 04/11/2017 11:33 AM  Line Care Connections checked and tightened 04/11/2017 11:33 AM  Line Adjustment (NICU/IV Team Only) No 04/11/2017 11:33 AM  Dressing Intervention New dressing 04/11/2017 11:33 AM  Dressing Change Due 04/18/17 04/11/2017 11:33 AM       Rolena Infante 04/11/2017, 11:35 AM

## 2017-04-13 LAB — AEROBIC/ANAEROBIC CULTURE (SURGICAL/DEEP WOUND)

## 2017-04-13 LAB — AEROBIC/ANAEROBIC CULTURE W GRAM STAIN (SURGICAL/DEEP WOUND): Culture: NORMAL

## 2017-04-14 ENCOUNTER — Non-Acute Institutional Stay (SKILLED_NURSING_FACILITY): Payer: Medicare Other | Admitting: Internal Medicine

## 2017-04-14 ENCOUNTER — Encounter: Payer: Self-pay | Admitting: Internal Medicine

## 2017-04-14 DIAGNOSIS — E119 Type 2 diabetes mellitus without complications: Secondary | ICD-10-CM | POA: Diagnosis not present

## 2017-04-14 DIAGNOSIS — L0291 Cutaneous abscess, unspecified: Secondary | ICD-10-CM | POA: Diagnosis not present

## 2017-04-14 DIAGNOSIS — I5033 Acute on chronic diastolic (congestive) heart failure: Secondary | ICD-10-CM | POA: Diagnosis not present

## 2017-04-14 NOTE — Assessment & Plan Note (Signed)
Clinical history stable on present dose of furosemide

## 2017-04-14 NOTE — Assessment & Plan Note (Signed)
Plastic surgery follow-up as scheduled by Dr. Marla Roe

## 2017-04-14 NOTE — Patient Instructions (Signed)
See assessment and plan under each diagnosis in the problem list and acutely for this visit

## 2017-04-14 NOTE — Assessment & Plan Note (Signed)
Random glucoses indicate adequate control

## 2017-04-14 NOTE — Progress Notes (Signed)
NURSING HOME LOCATION:  [facility] ROOM NUMBER:  [id number]  CODE STATUS:  Full Code  PCP:  Jani Gravel, MD  [address] [place] [address]   This is a comprehensive admission note to [facility] performed on this date less than 30 days from date of admission. Included are preadmission medical/surgical history;reconciled medication list; family history; social history and comprehensive review of systems.  Corrections and additions to the records were documented.  Comprehensive physical exam was also performed. Additionally a clinical summary was entered for each active diagnosis pertinent to this admission in the Problem List to enhance continuity of care.  HPI: The patient was hospitalized 11/28-12/8/18 with sepsis related to left leg cellulitis. The patient presented with a left leg wound infested with maggots and contaminated with cat hair Course was complicated by acute hypoxic respiratory  failure postoperatively.Imaging including CTA revealed only chronic left diaphragm hernia despite a d-dimer of 2.62. Lasix IV was transitioned to orally for a BNP of 409.. This was superimposed on background COPD with pulmonary hypertension, chronic diastolic heart failure, and severe protein caloric malnutrition. She was seen by Dr Marla Roe, Plastic Surgery for wound debridement and wound VAC placement . Ceftriaxone and Flagyl were to be continued for a total of 6 weeks. Last day of Ceftriaxone and metronidazole will be 1/10 For that purpose PICC line was placed 12/8. Weekly while on antibiotic the following labs were to be completed: CBC and differential, CMP, ESR, CRP. Baseline dementia was subtly present. CT revealed atrophy and small vessel ischemic changes of the white matter. Labs at discharge revealed a potassium of 5.1, glucose 156, BUN 49, creatinine 0.73, and white count 18,600. A1c was prediabetic at 6.1%. At the SNF glucoses have ranged from 91 fasting up to  210 post-evening meal  Past medical and surgical history: Includes hypertension, osteoporosis, depression, COPD, and chronic congestive heart failure.  Social history: Never smoked, nondrinker. She lives with a significant other. She has 12 cats in the home environment.  Family history: Reviewed  Review of systems:Could not be completed due to dementia. Date given as 04/21/1915. She named her Psychologist, sport and exercise as "[place], head of the school of nursing" she denies any pain or other active symptoms. She does acknowledge that she has a "hole" in the left leg.  Constitutional: No fever,significant weight change, fatigue  Eyes: No redness, discharge, pain, vision change ENT/mouth: No nasal congestion,  purulent discharge, earache,change in hearing ,sore throat  Cardiovascular: No chest pain, palpitations,paroxysmal nocturnal dyspnea, claudication, edema  Respiratory: No cough, sputum production,hemoptysis, DOE , significant snoring,apnea  Gastrointestinal: No heartburn,dysphagia,abdominal pain, nausea / vomiting,rectal bleeding, melena,change in bowels Genitourinary: No dysuria,hematuria, pyuria,  incontinence, nocturia Musculoskeletal: No joint stiffness, joint swelling, weakness,pain Dermatologic: No rash, pruritus, change in appearance of skin Neurologic: No dizziness,headache,syncope, seizures, numbness , tingling Psychiatric: No significant anxiety , depression, insomnia, anorexia Endocrine: No change in hair/ nails, excessive thirst, excessive hunger, excessive urination  Hematologic/lymphatic: No significant bruising, lymphadenopathy,abnormal bleeding Allergy/immunology: No itchy/ watery eyes, significant sneezing, urticaria, angioedema  Physical exam:  Pertinent or positive findings: She appears somewhat cachectic. Very prominent stare.Rhythm irregular; S1 & S2 are increased. Breath sounds are decreased. She has 1+ edema of the right lower extremity from the knee down. The skin over the right  lower extremity is dry. Pedal pulses on the right are palpable. She has eschar over the right shin. The left foot is wrapped. Wound drain is present. He has weakness in both lower extremities.  She has mild arthritic changes of the hands.  General appearance: no acute distress , increased work of breathing is present.   Lymphatic: No lymphadenopathy about the head, neck, axilla . Eyes: No conjunctival inflammation or lid edema is present. There is no scleral icterus. Ears:  External ear exam shows no significant lesions or deformities.   Nose:  External nasal examination shows no deformity or inflammation. Nasal mucosa are pink and moist without lesions ,exudates Oral exam: lips and gums are healthy appearing.There is no oropharyngeal erythema or exudate . Neck:  No thyromegaly, masses, tenderness noted.    Heart:  No gallop, murmur, click, rub .  Lungs: without wheezes, rhonchi,rales , rubs. Abdomen:Bowel sounds are normal. Abdomen is soft and nontender with no organomegaly, hernias,masses. GU: deferred  Extremities:  No cyanosis, clubbing Neurologic exam : Balance,Rhomberg,finger to nose testing could not be completed due to clinical state Skin: Warm & dry w/o tenting. No significant rash.  See clinical summary under each active problem in the Problem List with associated updated therapeutic plan

## 2017-04-24 ENCOUNTER — Emergency Department (HOSPITAL_COMMUNITY): Payer: Medicare Other

## 2017-04-24 ENCOUNTER — Inpatient Hospital Stay (HOSPITAL_COMMUNITY)
Admission: EM | Admit: 2017-04-24 | Discharge: 2017-05-01 | DRG: 871 | Disposition: A | Discharge status: Hospice | Payer: Medicare Other | Attending: Internal Medicine | Admitting: Internal Medicine

## 2017-04-24 ENCOUNTER — Other Ambulatory Visit: Payer: Self-pay

## 2017-04-24 DIAGNOSIS — I959 Hypotension, unspecified: Secondary | ICD-10-CM | POA: Diagnosis not present

## 2017-04-24 DIAGNOSIS — I10 Essential (primary) hypertension: Secondary | ICD-10-CM | POA: Diagnosis present

## 2017-04-24 DIAGNOSIS — Z833 Family history of diabetes mellitus: Secondary | ICD-10-CM

## 2017-04-24 DIAGNOSIS — T45515A Adverse effect of anticoagulants, initial encounter: Secondary | ICD-10-CM | POA: Diagnosis not present

## 2017-04-24 DIAGNOSIS — R600 Localized edema: Secondary | ICD-10-CM | POA: Diagnosis present

## 2017-04-24 DIAGNOSIS — I2723 Pulmonary hypertension due to lung diseases and hypoxia: Secondary | ICD-10-CM | POA: Diagnosis present

## 2017-04-24 DIAGNOSIS — R6521 Severe sepsis with septic shock: Secondary | ICD-10-CM | POA: Diagnosis present

## 2017-04-24 DIAGNOSIS — L03115 Cellulitis of right lower limb: Secondary | ICD-10-CM | POA: Diagnosis not present

## 2017-04-24 DIAGNOSIS — A419 Sepsis, unspecified organism: Principal | ICD-10-CM | POA: Diagnosis present

## 2017-04-24 DIAGNOSIS — E872 Acidosis: Secondary | ICD-10-CM | POA: Diagnosis present

## 2017-04-24 DIAGNOSIS — R339 Retention of urine, unspecified: Secondary | ICD-10-CM

## 2017-04-24 DIAGNOSIS — R4182 Altered mental status, unspecified: Secondary | ICD-10-CM | POA: Diagnosis present

## 2017-04-24 DIAGNOSIS — Z794 Long term (current) use of insulin: Secondary | ICD-10-CM

## 2017-04-24 DIAGNOSIS — D649 Anemia, unspecified: Secondary | ICD-10-CM | POA: Diagnosis present

## 2017-04-24 DIAGNOSIS — R6520 Severe sepsis without septic shock: Secondary | ICD-10-CM | POA: Diagnosis not present

## 2017-04-24 DIAGNOSIS — I272 Pulmonary hypertension, unspecified: Secondary | ICD-10-CM | POA: Diagnosis not present

## 2017-04-24 DIAGNOSIS — I361 Nonrheumatic tricuspid (valve) insufficiency: Secondary | ICD-10-CM | POA: Diagnosis not present

## 2017-04-24 DIAGNOSIS — K625 Hemorrhage of anus and rectum: Secondary | ICD-10-CM | POA: Diagnosis not present

## 2017-04-24 DIAGNOSIS — E118 Type 2 diabetes mellitus with unspecified complications: Secondary | ICD-10-CM

## 2017-04-24 DIAGNOSIS — Z66 Do not resuscitate: Secondary | ICD-10-CM | POA: Diagnosis not present

## 2017-04-24 DIAGNOSIS — Z681 Body mass index (BMI) 19 or less, adult: Secondary | ICD-10-CM

## 2017-04-24 DIAGNOSIS — I5032 Chronic diastolic (congestive) heart failure: Secondary | ICD-10-CM | POA: Diagnosis present

## 2017-04-24 DIAGNOSIS — E11649 Type 2 diabetes mellitus with hypoglycemia without coma: Secondary | ICD-10-CM | POA: Diagnosis not present

## 2017-04-24 DIAGNOSIS — R338 Other retention of urine: Secondary | ICD-10-CM | POA: Diagnosis present

## 2017-04-24 DIAGNOSIS — N179 Acute kidney failure, unspecified: Secondary | ICD-10-CM | POA: Diagnosis present

## 2017-04-24 DIAGNOSIS — J449 Chronic obstructive pulmonary disease, unspecified: Secondary | ICD-10-CM | POA: Diagnosis present

## 2017-04-24 DIAGNOSIS — I82432 Acute embolism and thrombosis of left popliteal vein: Secondary | ICD-10-CM | POA: Diagnosis present

## 2017-04-24 DIAGNOSIS — I824Z2 Acute embolism and thrombosis of unspecified deep veins of left distal lower extremity: Secondary | ICD-10-CM | POA: Diagnosis not present

## 2017-04-24 DIAGNOSIS — Z880 Allergy status to penicillin: Secondary | ICD-10-CM

## 2017-04-24 DIAGNOSIS — I4891 Unspecified atrial fibrillation: Secondary | ICD-10-CM | POA: Diagnosis present

## 2017-04-24 DIAGNOSIS — E119 Type 2 diabetes mellitus without complications: Secondary | ICD-10-CM

## 2017-04-24 DIAGNOSIS — I82442 Acute embolism and thrombosis of left tibial vein: Secondary | ICD-10-CM | POA: Diagnosis present

## 2017-04-24 DIAGNOSIS — I9589 Other hypotension: Secondary | ICD-10-CM | POA: Diagnosis not present

## 2017-04-24 DIAGNOSIS — L03116 Cellulitis of left lower limb: Secondary | ICD-10-CM | POA: Diagnosis present

## 2017-04-24 DIAGNOSIS — I110 Hypertensive heart disease with heart failure: Secondary | ICD-10-CM | POA: Diagnosis present

## 2017-04-24 DIAGNOSIS — R0902 Hypoxemia: Secondary | ICD-10-CM | POA: Diagnosis present

## 2017-04-24 DIAGNOSIS — E861 Hypovolemia: Secondary | ICD-10-CM | POA: Diagnosis not present

## 2017-04-24 DIAGNOSIS — R609 Edema, unspecified: Secondary | ICD-10-CM | POA: Diagnosis not present

## 2017-04-24 DIAGNOSIS — F0390 Unspecified dementia without behavioral disturbance: Secondary | ICD-10-CM | POA: Diagnosis present

## 2017-04-24 DIAGNOSIS — E860 Dehydration: Secondary | ICD-10-CM | POA: Diagnosis present

## 2017-04-24 DIAGNOSIS — Z882 Allergy status to sulfonamides status: Secondary | ICD-10-CM

## 2017-04-24 DIAGNOSIS — I82412 Acute embolism and thrombosis of left femoral vein: Secondary | ICD-10-CM | POA: Diagnosis present

## 2017-04-24 DIAGNOSIS — E43 Unspecified severe protein-calorie malnutrition: Secondary | ICD-10-CM | POA: Diagnosis present

## 2017-04-24 DIAGNOSIS — Z7951 Long term (current) use of inhaled steroids: Secondary | ICD-10-CM

## 2017-04-24 DIAGNOSIS — E876 Hypokalemia: Secondary | ICD-10-CM | POA: Diagnosis present

## 2017-04-24 DIAGNOSIS — J431 Panlobular emphysema: Secondary | ICD-10-CM | POA: Diagnosis not present

## 2017-04-24 DIAGNOSIS — Z8249 Family history of ischemic heart disease and other diseases of the circulatory system: Secondary | ICD-10-CM

## 2017-04-24 DIAGNOSIS — Z515 Encounter for palliative care: Secondary | ICD-10-CM

## 2017-04-24 DIAGNOSIS — G9340 Encephalopathy, unspecified: Secondary | ICD-10-CM

## 2017-04-24 DIAGNOSIS — M810 Age-related osteoporosis without current pathological fracture: Secondary | ICD-10-CM | POA: Diagnosis present

## 2017-04-24 DIAGNOSIS — Z888 Allergy status to other drugs, medicaments and biological substances status: Secondary | ICD-10-CM

## 2017-04-24 DIAGNOSIS — I071 Rheumatic tricuspid insufficiency: Secondary | ICD-10-CM | POA: Diagnosis present

## 2017-04-24 DIAGNOSIS — D6832 Hemorrhagic disorder due to extrinsic circulating anticoagulants: Secondary | ICD-10-CM | POA: Diagnosis not present

## 2017-04-24 DIAGNOSIS — Z91040 Latex allergy status: Secondary | ICD-10-CM

## 2017-04-24 DIAGNOSIS — Z7983 Long term (current) use of bisphosphonates: Secondary | ICD-10-CM

## 2017-04-24 LAB — CBG MONITORING, ED
GLUCOSE-CAPILLARY: 103 mg/dL — AB (ref 65–99)
Glucose-Capillary: 102 mg/dL — ABNORMAL HIGH (ref 65–99)

## 2017-04-24 LAB — CBC
HCT: 39.6 % (ref 36.0–46.0)
HEMOGLOBIN: 13 g/dL (ref 12.0–15.0)
MCH: 31.6 pg (ref 26.0–34.0)
MCHC: 32.8 g/dL (ref 30.0–36.0)
MCV: 96.1 fL (ref 78.0–100.0)
PLATELETS: 399 10*3/uL (ref 150–400)
RBC: 4.12 MIL/uL (ref 3.87–5.11)
RDW: 17.4 % — ABNORMAL HIGH (ref 11.5–15.5)
WBC: 29.9 10*3/uL — AB (ref 4.0–10.5)

## 2017-04-24 LAB — LACTIC ACID, PLASMA
LACTIC ACID, VENOUS: 3.3 mmol/L — AB (ref 0.5–1.9)
LACTIC ACID, VENOUS: 2.2 mmol/L — AB (ref 0.5–1.9)

## 2017-04-24 LAB — CK: CK TOTAL: 37 U/L — AB (ref 38–234)

## 2017-04-24 LAB — URINALYSIS, ROUTINE W REFLEX MICROSCOPIC
Bacteria, UA: NONE SEEN
Bilirubin Urine: NEGATIVE
GLUCOSE, UA: NEGATIVE mg/dL
KETONES UR: NEGATIVE mg/dL
LEUKOCYTES UA: NEGATIVE
NITRITE: NEGATIVE
PH: 5 (ref 5.0–8.0)
PROTEIN: NEGATIVE mg/dL
Specific Gravity, Urine: 1.009 (ref 1.005–1.030)
Squamous Epithelial / LPF: NONE SEEN

## 2017-04-24 LAB — COMPREHENSIVE METABOLIC PANEL
ALT: 9 U/L — ABNORMAL LOW (ref 14–54)
ANION GAP: 9 (ref 5–15)
AST: 26 U/L (ref 15–41)
Albumin: 1.2 g/dL — ABNORMAL LOW (ref 3.5–5.0)
Alkaline Phosphatase: 134 U/L — ABNORMAL HIGH (ref 38–126)
BUN: 50 mg/dL — ABNORMAL HIGH (ref 6–20)
CHLORIDE: 107 mmol/L (ref 101–111)
CO2: 23 mmol/L (ref 22–32)
CREATININE: 1.13 mg/dL — AB (ref 0.44–1.00)
Calcium: 5.2 mg/dL — CL (ref 8.9–10.3)
GFR, EST AFRICAN AMERICAN: 51 mL/min — AB (ref >60)
GFR, EST NON AFRICAN AMERICAN: 44 mL/min — AB (ref >60)
Glucose, Bld: 91 mg/dL (ref 65–99)
POTASSIUM: 3.9 mmol/L (ref 3.5–5.1)
SODIUM: 139 mmol/L (ref 135–145)
Total Bilirubin: 0.6 mg/dL (ref 0.3–1.2)
Total Protein: 3.4 g/dL — ABNORMAL LOW (ref 6.5–8.1)

## 2017-04-24 LAB — GLUCOSE, CAPILLARY
GLUCOSE-CAPILLARY: 84 mg/dL (ref 65–99)
Glucose-Capillary: 92 mg/dL (ref 65–99)

## 2017-04-24 LAB — I-STAT CG4 LACTIC ACID, ED
LACTIC ACID, VENOUS: 2.84 mmol/L — AB (ref 0.5–1.9)
Lactic Acid, Venous: 4.98 mmol/L (ref 0.5–1.9)

## 2017-04-24 LAB — C-REACTIVE PROTEIN: CRP: 5.7 mg/dL — ABNORMAL HIGH (ref <1.0)

## 2017-04-24 LAB — MRSA PCR SCREENING: MRSA BY PCR: NEGATIVE

## 2017-04-24 LAB — CORTISOL: CORTISOL PLASMA: 85.8 ug/dL

## 2017-04-24 LAB — POC OCCULT BLOOD, ED: FECAL OCCULT BLD: POSITIVE — AB

## 2017-04-24 LAB — PROCALCITONIN: Procalcitonin: 0.64 ng/mL

## 2017-04-24 MED ORDER — VANCOMYCIN HCL IN DEXTROSE 1-5 GM/200ML-% IV SOLN
1000.0000 mg | Freq: Once | INTRAVENOUS | Status: AC
  Administered 2017-04-24: 1000 mg via INTRAVENOUS
  Filled 2017-04-24: qty 200

## 2017-04-24 MED ORDER — INSULIN ASPART 100 UNIT/ML ~~LOC~~ SOLN
0.0000 [IU] | SUBCUTANEOUS | Status: DC
  Administered 2017-04-26 (×3): 1 [IU] via SUBCUTANEOUS
  Administered 2017-04-27 (×2): 2 [IU] via SUBCUTANEOUS

## 2017-04-24 MED ORDER — ACETAMINOPHEN 650 MG RE SUPP: 650.0000 mg | Freq: Four times a day (QID) | RECTAL | Status: DC | PRN

## 2017-04-24 MED ORDER — MEROPENEM 1 G IV SOLR
1.0000 g | Freq: Two times a day (BID) | INTRAVENOUS | Status: DC
  Administered 2017-04-24 – 2017-04-29 (×11): 1 g via INTRAVENOUS
  Filled 2017-04-24 (×12): qty 1

## 2017-04-24 MED ORDER — ACETAMINOPHEN 325 MG PO TABS: 650.0000 mg | ORAL_TABLET | Freq: Four times a day (QID) | ORAL | Status: DC | PRN

## 2017-04-24 MED ORDER — ONDANSETRON HCL 4 MG PO TABS: 4.0000 mg | ORAL_TABLET | Freq: Four times a day (QID) | ORAL | Status: DC | PRN

## 2017-04-24 MED ORDER — SODIUM CHLORIDE 0.9 % IV SOLN
INTRAVENOUS | Status: DC
  Administered 2017-04-24: 15:00:00 via INTRAVENOUS

## 2017-04-24 MED ORDER — HYDROCORTISONE NA SUCCINATE PF 100 MG IJ SOLR
50.0000 mg | Freq: Four times a day (QID) | INTRAMUSCULAR | Status: DC
  Administered 2017-04-24 – 2017-04-25 (×4): 50 mg via INTRAVENOUS
  Filled 2017-04-24 (×4): qty 2

## 2017-04-24 MED ORDER — MOMETASONE FURO-FORMOTEROL FUM 200-5 MCG/ACT IN AERO
2.0000 | INHALATION_SPRAY | Freq: Two times a day (BID) | RESPIRATORY_TRACT | Status: DC
  Administered 2017-04-25 – 2017-04-29 (×9): 2 via RESPIRATORY_TRACT
  Filled 2017-04-24: qty 8.8

## 2017-04-24 MED ORDER — ENOXAPARIN SODIUM 40 MG/0.4ML ~~LOC~~ SOLN
40.0000 mg | SUBCUTANEOUS | Status: DC
  Administered 2017-04-25: 40 mg via SUBCUTANEOUS
  Filled 2017-04-24 (×2): qty 0.4

## 2017-04-24 MED ORDER — ALBUTEROL SULFATE (2.5 MG/3ML) 0.083% IN NEBU: 3.0000 mL | INHALATION_SOLUTION | Freq: Four times a day (QID) | RESPIRATORY_TRACT | Status: DC | PRN

## 2017-04-24 MED ORDER — NYSTATIN 100000 UNIT/ML MT SUSP
5.0000 mL | Freq: Four times a day (QID) | OROMUCOSAL | Status: DC
  Administered 2017-04-24: 500000 [IU] via ORAL
  Filled 2017-04-24: qty 5

## 2017-04-24 MED ORDER — SODIUM CHLORIDE 0.9 % IV BOLUS (SEPSIS)
500.0000 mL | Freq: Once | INTRAVENOUS | Status: AC
  Administered 2017-04-24: 500 mL via INTRAVENOUS

## 2017-04-24 MED ORDER — SODIUM CHLORIDE 0.9 % IV SOLN
INTRAVENOUS | Status: AC
  Administered 2017-04-25: via INTRAVENOUS
  Administered 2017-04-25: 100 mL/h via INTRAVENOUS

## 2017-04-24 MED ORDER — VANCOMYCIN HCL IN DEXTROSE 750-5 MG/150ML-% IV SOLN
750.0000 mg | INTRAVENOUS | Status: DC
  Administered 2017-04-25 – 2017-04-27 (×3): 750 mg via INTRAVENOUS
  Filled 2017-04-24 (×4): qty 150

## 2017-04-24 MED ORDER — ONDANSETRON HCL 4 MG/2ML IJ SOLN
4.0000 mg | Freq: Four times a day (QID) | INTRAMUSCULAR | Status: DC | PRN
Start: 2017-04-24 — End: 2017-04-29

## 2017-04-24 MED ORDER — MORPHINE SULFATE (PF) 4 MG/ML IV SOLN
1.0000 mg | INTRAVENOUS | Status: DC | PRN
  Filled 2017-04-24: qty 1

## 2017-04-24 MED ORDER — SODIUM CHLORIDE 0.9 % IV SOLN
Freq: Once | INTRAVENOUS | Status: AC
  Administered 2017-04-24: 12:00:00 via INTRAVENOUS

## 2017-04-24 NOTE — H&P (Signed)
History and Physical    [name] RCV:[id number] DOB: 01-Jun-1933 DOA: 04/24/2017   PCP: Jani Gravel, MD   Attending physician: Erlinda Hong  Patient coming from/Resides with: SNF  Chief Complaint: Altered mental status  HPI: Angel Estrada is a 83 y.o. female with medical history significant for COPD with pulmonary hypertension and moderate RV systolic dysfunction, hypertension, grade 1 diastolic dysfunction, dementia, diabetes on metformin.  Was recently discharged to skilled nursing facility after an admission secondary to left lower extremity cellulitis with severe sepsis physiology.  Patient underwent surgical debridement and wound VAC placement.  ID was following during the hospitalization and recommended 6 weeks of IV Rocephin with Flagyl with last dose due 05/14/17.  Of note patient presented with insect infestation to wound.  Patient was discharged with a PICC line in place in the right upper extremity.  Patient was sent back to the ER today because of altered mental status.  EDP documents SNF staff reported patient with poor oral intake for multiple days.  Upon presentation patient was hypotensive with a blood pressure of 80/68, MAP 62, pulse 125 and irregular with new onset atrial fibrillation, respiratory rate 30, she was not hypoxemic.  Labs revealed mild acute kidney injury, elevated serum lactate of 4.98, significant leukocytosis with a white count of 29,900 differential not obtained.  Patient was noted to have acute urinary retention and a Foley catheter was placed with about 2 L returned.  Patient has been given a total of 1500 cc of normal saline bolus based on sepsis criteria with mild hypotension persisting.  She is due to receive another 1 L.  Pulse has improved into the 110s.  Her lactate has decreased to 2.84.  Patient was initially evaluated by PCCM.  Current acute condition and physiologic status reviewed with patient's partner of 50 years.  Decision was made to make patient a  DNR including no utilization of pressors although okay with short-term acute treatment of infection.  Infectious diseases been consulted by the EDP.  Patient will be admitted to the stepdown unit with suspected sepsis of uncertain source, new onset atrial fibrillation with RVR, and acute urinary retention.  ED Course:  Vital Signs: BP (!) 78/58 (BP Location: Right Arm)   Pulse 62   Temp 97.9 F (36.6 C)   Resp 19   Ht _0  (1.651 m)   Wt 45.4 kg (100 lb)   SpO2 94%   BMI 16.64 kg/m  PCXR: Right PICC line stable position, no acute focal infiltrates Lab data: Sodium 139, potassium 3.9, chloride 107, CO2 23, glucose 91, BUN 50, creatinine 1.13, calcium 5.2, albumin 1.2, alkaline phosphatase 134, LFTs not elevated, lactate 4.98 with repeat 2.84, white count 29,900 differential not obtained, hemoglobin 13, platelets 399,000, urinalysis unremarkable except for small hemoglobin, borderline low specific gravity 1.009, FOB positive but not grossly positive, blood cultures and urine culture has been obtained. Medications and treatments: Saline bolus time 1500 cc with an additional liter pending, meropenem 1 g IV x1, vancomycin 1 g IV x1  Review of Systems:  **Unable to obtain from patient due to underlying dementia.  History obtained from the chart and from bedside healthcare personnel as well as report from Angel Estrada   Past Medical History:  Diagnosis Date  . CHF (congestive heart failure) ([place])   . COPD (chronic obstructive pulmonary disease) ([place])   . Depression   . Diabetes mellitus without complication ([place])   . Hypertension   . Osteoporosis     Past Surgical  History:  Procedure Laterality Date  . APPLICATION OF A-CELL OF EXTREMITY Left 04/08/2017   Procedure: APPLICATION OF A-CELL OF EXTREMITY;  Surgeon: Wallace Going, DO;  Location: [facility];  Service: Plastics;  Laterality: Left;  . APPLICATION OF WOUND VAC Left 04/08/2017   Procedure: APPLICATION OF WOUND VAC;  Surgeon: Wallace Going, DO;  Location: [facility];  Service: Plastics;  Laterality: Left;  . CATARACT EXTRACTION Bilateral   . I&D EXTREMITY Left 04/08/2017   Procedure: IRRIGATION AND DEBRIDEMENT EXTREMITY;  Surgeon: Wallace Going, DO;  Location: [facility];  Service: Plastics;  Laterality: Left;  . US ECHOCARDIOGRAPHY      Social History   Socioeconomic History  . Marital status: Significant Other    Spouse name: Not on file  . Number of children: Not on file  . Years of education: Not on file  . Highest education level: Not on file  Social Needs  . Financial resource strain: Not on file  . Food insecurity - worry: Not on file  . Food insecurity - inability: Not on file  . Transportation needs - medical: Not on file  . Transportation needs - non-medical: Not on file  Occupational History  . Not on file  Tobacco Use  . Smoking status: Never Smoker  . Smokeless tobacco: Never Used  Substance and Sexual Activity  . Alcohol use: No  . Drug use: No  . Sexual activity: Not on file  Other Topics Concern  . Not on file  Social History Narrative  . Not on file    Mobility: Requires assistance Work history: Unknown   Allergies  Allergen Reactions  . Penicillins Swelling and Rash    Site of swelling (?) Has patient had a PCN reaction causing immediate rash, facial/tongue/throat swelling, SOB or lightheadedness with hypotension: Yes Has patient had a PCN reaction causing severe rash involving mucus membranes or skin necrosis: No Has patient had a PCN reaction that required hospitalization: No Has patient had a PCN reaction occurring within the last 10 years: No If all of the above answers are "NO", then may proceed with Cephalosporin use.   Marland Kitchen Lisinopril-Hydrochlorothiazide Other (See Comments) and Cough    Pt's PCP took her off medication because of possible kidney damage  . Sulfa Antibiotics Other (See Comments)    Unknown reaction-- childhood allergy   . Latex Rash    Family History    Problem Relation Age of Onset  . Diabetes type II Mother   . Hypertension Mother   . Brain cancer Father   . Hypertension Father   . Diabetes type II Brother   . Stroke Other      Prior to Admission medications   Medication Sig Start Date End Date Taking? Authorizing Provider  acetaminophen (TYLENOL) 325 MG tablet Take 2 tablets (650 mg total) by mouth every 6 (six) hours as needed for mild pain (or Fever >/= 101). 04/11/17  Yes Rai, Ripudeep K, MD  alendronate (FOSAMAX) 35 MG tablet Take 35 mg by mouth every 7 (seven) days.  12/30/16  Yes [provider]  allopurinol (ZYLOPRIM) 100 MG tablet Take 200 mg by mouth daily. 01/23/17  Yes [provider]  bisacodyl (DULCOLAX) 10 MG suppository Place 10 mg rectally as needed for moderate constipation.   Yes [provider]  budesonide-formoterol (SYMBICORT) 160-4.5 MCG/ACT inhaler Inhale 2 puffs into the lungs 2 (two) times daily.   Yes [provider]  cefTRIAXone (ROCEPHIN) IVPB Inject 2 g into  the vein daily. Indication:  Osteomyelitis Last Day of Therapy:  05/14/17 Labs - Once weekly:  CBC/D and BMP, Labs - Every other week:  ESR and CRP 04/11/17 05/15/17 Yes Rai, Ripudeep K, MD  feeding supplement, ENSURE ENLIVE, (ENSURE ENLIVE) LIQD Take 237 mLs by mouth 2 (two) times daily between meals. 04/11/17  Yes Rai, Ripudeep K, MD  furosemide (LASIX) 40 MG tablet Take 1 tablet (40 mg total) by mouth 2 (two) times daily. 04/11/17  Yes Rai, Ripudeep K, MD  insulin aspart (NOVOLOG) 100 UNIT/ML injection Inject 0-9 Units into the skin 3 (three) times daily with meals. Sliding scale CBG 70 - 120: 0 units CBG 121 - 150: 1 unit,  CBG 151 - 200: 2 units,  CBG 201 - 250: 3 units,  CBG 251 - 300: 5 units,  CBG 301 - 350: 7 units,  CBG 351 - 400: 9 units   CBG > 400: 9 units and notify your MD 04/11/17  Yes Rai, Ripudeep K, MD  Liniments (SALONPAS PAIN RELIEF PATCH EX) Apply 1 patch topically daily as needed (PAIN).   Yes [provider]  magnesium hydroxide (MILK OF MAGNESIA) 400 MG/5ML suspension Take 30 mLs by mouth daily as needed for mild constipation.   Yes [provider]  metroNIDAZOLE (FLAGYL) 500 MG tablet Take 1 tablet (500 mg total) by mouth 3 (three) times daily. Indication:  Osteomyelitis Last Day of Therapy:  05/14/17 Labs - Once weekly:  CBC/D and BMP, Labs - Every other week:  ESR and CRP 04/11/17 05/15/17 Yes Rai, Ripudeep K, MD  Multiple Vitamin (MULTIVITAMIN WITH MINERALS) TABS tablet Take 1 tablet by mouth daily. 04/11/17  Yes Rai, Ripudeep K, MD  nystatin-triamcinolone (MYCOLOG II) cream Apply 1 application topically 2 (two) times daily as needed (rash). Reported on 07/30/2015 07/02/15  Yes [provider]  Polyethyl Glyc-Propyl Glyc PF (SYSTANE ULTRA PF) 0.4-0.3 % SOLN Place 1 drop into both eyes 2 (two) times daily. "SYSTANE"   Yes [provider]  sildenafil (REVATIO) 20 MG tablet Take 1 tablet (20 mg total) by mouth 3 (three) times daily. 07/28/16  Yes Josue Hector, MD  albuterol (PROAIR HFA) 108 (90 Base) MCG/ACT inhaler Inhale 2 puffs into the lungs every 6 (six) hours as needed for wheezing or shortness of breath.    [provider]  fluticasone (FLONASE) 50 MCG/ACT nasal spray Place 2 sprays into both nostrils daily as needed for allergies or rhinitis.    [provider]    Physical Exam: Vitals:   04/24/17 1417 04/24/17 1430 04/24/17 1445 04/24/17 1503  BP: 93/72 (!) 81/62 (!) 81/69 (!) 78/58  Pulse: 62     Resp: 16 (!) _0 Temp: 97.9 F (36.6 C) 97.9 F (36.6 C) 97.9 F (36.6 C) 97.9 F (36.6 C)  TempSrc: Core     SpO2: 94%   94%  Weight:      Height:          Constitutional: Mild distress as evidenced by ongoing tachycardia and restlessness Eyes: PERRL, lids and conjunctivae normal ENMT: Mucous membranes are dry- posterior pharynx clear of any exudate or lesions.poor dentition.  Neck: normal, supple, no masses, no  thyromegaly Respiratory: clear to auscultation bilaterally, no wheezing, no crackles. Normal respiratory effort. No accessory muscle use.  Cardiovascular: Regular rate and rhythm, no murmurs / rubs / gallops.  Bilateral lower extremity edema-trace on the right 2-3+ on the left. 2+ pedal pulses. No carotid bruits.  Hands and feet cool to touch with delayed capillary refill greater than 2 seconds. Abdomen: no tenderness, no masses palpated. No hepatosplenomegaly. Bowel sounds positive.  Musculoskeletal: no clubbing / cyanosis. No joint deformity upper and lower extremities. Good ROM, no contractures. Normal muscle tone.  Skin: no rashes, lesions, ulcers. No induration.  Wound VAC in place left medial calf with no periwound VAC drainage or erythema/redness Neurologic: CN 2-12 grossly intact. Sensation intact, DTR normal. Strength 5/5 x all 4 extremities.  Psychiatric: Awake and oriented times name only.   Labs on Admission: I have personally reviewed following labs and imaging studies  CBC: Recent Labs  Lab 04/24/17 1045  WBC 29.9*  HGB 13.0  HCT 39.6  MCV 96.1  PLT 315   Basic Metabolic Panel: Recent Labs  Lab 04/24/17 1220  NA 139  K 3.9  CL 107  CO2 23  GLUCOSE 91  BUN 50*  CREATININE 1.13*  CALCIUM 5.2*   GFR: Estimated Creatinine Clearance: 28 mL/min (A) (by C-G formula based on SCr of 1.13 mg/dL (H)). Liver Function Tests: Recent Labs  Lab 04/24/17 1220  AST 26  ALT 9*  ALKPHOS 134*  BILITOT 0.6  PROT 3.4*  ALBUMIN 1.2*   No results for input(s): LIPASE, AMYLASE in the last 168 hours. No results for input(s): AMMONIA in the last 168 hours. Coagulation Profile: No results for input(s): INR, PROTIME in the last 168 hours. Cardiac Enzymes: No results for input(s): CKTOTAL, CKMB, CKMBINDEX, TROPONINI in the last 168 hours. BNP (last 3 results) No results for input(s): PROBNP in the last 8760 hours. HbA1C: No results for input(s): HGBA1C in the last 72  hours. CBG: Recent Labs  Lab 04/24/17 1108  GLUCAP 103*   Lipid Profile: No results for input(s): CHOL, HDL, LDLCALC, TRIG, CHOLHDL, LDLDIRECT in the last 72 hours. Thyroid Function Tests: No results for input(s): TSH, T4TOTAL, FREET4, T3FREE, THYROIDAB in the last 72 hours. Anemia Panel: No results for input(s): VITAMINB12, FOLATE, FERRITIN, TIBC, IRON, RETICCTPCT in the last 72 hours. Urine analysis:    Component Value Date/Time   COLORURINE YELLOW 04/24/2017 1300   APPEARANCEUR CLEAR 04/24/2017 1300   LABSPEC 1.009 04/24/2017 1300   PHURINE 5.0 04/24/2017 1300   GLUCOSEU NEGATIVE 04/24/2017 1300   HGBUR SMALL (A) 04/24/2017 1300   BILIRUBINUR NEGATIVE 04/24/2017 1300   KETONESUR NEGATIVE 04/24/2017 1300   PROTEINUR NEGATIVE 04/24/2017 1300   NITRITE NEGATIVE 04/24/2017 1300   LEUKOCYTESUR NEGATIVE 04/24/2017 1300   Sepsis Labs: _0 (procalcitonin:4,lacticidven:4) )No results found for this or any previous visit (from the past 240 hour(s)).   Radiological Exams on Admission: Dg Chest Portable 1 View  Result Date: 04/24/2017 CLINICAL DATA:  Altered mental status. EXAM: PORTABLE CHEST 1 VIEW COMPARISON:  Chest x-ray 04/11/2017.  CT 04/08/2017 . FINDINGS: Right PICC line in stable position. Mediastinum hilar structures are normal. Heart size stable. Stable elevation left hemidiaphragm with stable left basilar atelectasis. Stable mild right base atelectasis. Thoracolumbar spine scoliosis. Air-filled scratched it air-containing structures over the left upper abdomen most likely represent stomach and colon. reference is made to prior CT report of 04/08/2017 . IMPRESSION: 1. Right PICC line stable position. 2. Stable elevation left hemidiaphragm with stable atelectasis left base. Stable mild right base subsegmental atelectasis. Electronically Signed   By: Marcello Moores  Register   On: 04/24/2017 11:46    EKG: (Independently reviewed) atrial fibrillation with ventricular response 127  bpm, QTC 469 ms,, right ventricular hypertrophy versus incomplete right bundle branch block, normal R  wave rotation with borderline voltage criteria for LVH, no acute ischemic changes  Assessment/Plan Principal Problem:   Sepsis affecting ? skin vs other source -Presents with marked sepsis physiology: (Hypotension, tachycardia, tachypnea, leukocytosis greater than 20,000, lactic acid greater than 4, acute kidney injury, altered mentation) with source not clearly defined at presentation -Has recently been treated for sepsis and left lower extremity and this should be considered, other source to be considered includes bacteremia secondary to PICC line; urinalysis appears unremarkable but patient was on IV Rocephin and Flagyl prior to admission and therefore urine response to infection could be blunted -ID consultation pending -ID recommended IV vancomycin and IV meropenem -Follow-up on blood cultures and urine culture; if blood cultures are positive patient will need to have PICC line removed and alternative IV access established -Stress dose cortisol IV started by critical care medicine and we will continue since patient remains hypotensive with systolic blood pressures in the 80s with an MaP less than 65; patient significant other has requested no aggressive measures including utilization of pressors -Lactate has improved with hydration-we will continue normal saline at 100/HR for 36 hours after total fluid bolus volume (calculated at 2500 cc) completed -Given underlying moderate RV systolic dysfunction patient likely needs to remain on the volume elevated side -Continue to cycle lactate -Follow CRP -If no significant improvement in the next 12-24 hours may need to transition to comfort measures -Of note patient's albumin is 1.2 which is a very poor prognostic indicator  Active Problems:   New onset atrial fibrillation  -Likely secondary to acute sepsis physiology in the context of underlying  pulmonary hypertension and cardiac remodeling with atrial and ventricular dilatation -Continue to hydrate noting presenting tachycardia improved with fluids -Patient with hypotension so likely not a candidate for IV Cardizem-if rate control remains an issue may need to consult cardiology to initiate amiodarone -Continue telemetry monitoring -Given poor prognosis will not initiate anticoagulation at this juncture -CHADS2 Score for Stroke Risk in Atrial Fibrillation History of CHF: Yes (1 point) History of hypertension: Yes (1 point) Age greater than or equal to 75 years: Yes (1 point) History of diabetes mellitus: Yes (1 point) Previous stroke symptoms or TIA: No (0 points) Score: 4. Thromboembolic event high risk. Risk of event is 8.5% per year if no coumadin.    Leg edema, left -Marked unilateral edema and same leg as wound-not appear to be associated with acute infectious symptoms -Venous duplex to rule out acute DVT; if DVT is present given ongoing tachycardia may need to consider ruling out PE as well -For now we will utilize prophylactic Lovenox    Diabetes mellitus without complication  -Low CBGs and utilize SSI -HgbA1c 6.1 during previous admission    Acute kidney injury  -Baseline renal function: 49 and 0.73 -Current renal function: 50 and 1.13 -Multifactorial in etiology: Hypoperfusion from hypotension, preadmission Lasix -Hold offending medications -IV fluid hydration as above -Follow labs    Acute urinary retention -Patient had no awareness attention symptoms -Foley catheter placed in the ER with about 2 L return -Urinalysis not consistent with acute UTI but patient was also on Rocephin and Flagyl preadmission and this could be blunting infectious response so need to follow-up on urine culture   ?? Hypocalcemia -Corrected calcium is 7.44    FOB positive -Not grossly positive -Monitor for acute bleeding symptomatology    HTN (hypertension)/grade 1 diastolic  dysfunction -Current blood pressure suboptimal in the setting of sepsis physiology -Patient was not on antihypertensive medications  prior to admission but was taking Lasix BID she is currently being held    Pulmonary hypertension due to COPD with associated tricuspid regurgitation and moderate RV systolic dysfunction -Unable to utilize Revatio due to concurrent hypotension -Continue preadmission Symbicort and albuterol    Dementia (suspected) -Not on disease modifying meds prior to admission      DVT prophylaxis: Lovenox Code Status: DNR Family Communication: PCCM spoke extensively with patient's significant other/partner and plan is DNR and no aggressive care no escalation of care Disposition Plan: SNF Consults called: ID/[place]; PCCM/[place]    Nestor Wieneke L. ANP-BC [facility] Pager [phone]   If 7PM-7AM, please contact night-coverage [email or web address] Password [facility]  04/24/2017, 3:19 PM

## 2017-04-24 NOTE — H&P (Signed)
PULMONARY / CRITICAL CARE MEDICINE   Name: Angel Estrada MRN: [id number] DOB: Jul 20, 1933    ADMISSION DATE:  04/24/2017 CONSULTATION DATE:  04/24/2017  REFERRING MD:  EDP-[facility]  CHIEF COMPLAINT:  Septic shock and leg wound infection  HISTORY OF PRESENT ILLNESS:   83 year old female with PMH of wound infection and CHF that has a wound vac on it.  Patient was brought to the ED with AMS.  In the ED, the patient was noted to be hypotensive with lactic acidosis.  The patient has history of dementia and able to provide little history.  History is obtained from chart.  Wound infection with ?osteo management by wound care.  Was on abx at home through a single lumen PICC.  PCCM was consulted for septic shock and lactic acidosis.  PAST MEDICAL HISTORY :  She  has a past medical history of CHF (congestive heart failure) ([place]), COPD (chronic obstructive pulmonary disease) ([place]), Depression, Diabetes mellitus without complication ([place]), Hypertension, and Osteoporosis.  PAST SURGICAL HISTORY: She  has a past surgical history that includes US ECHOCARDIOGRAPHY; Cataract extraction (Bilateral); I&D extremity (Left, 04/08/2017); Application of a-cell of extremity (Left, 83/0/9407); and Application if wound vac (Left, 04/08/2017).  Allergies  Allergen Reactions  . Penicillins Swelling and Rash    Site of swelling (?) Has patient had a PCN reaction causing immediate rash, facial/tongue/throat swelling, SOB or lightheadedness with hypotension: Yes Has patient had a PCN reaction causing severe rash involving mucus membranes or skin necrosis: No Has patient had a PCN reaction that required hospitalization: No Has patient had a PCN reaction occurring within the last 10 years: No If all of the above answers are "NO", then may proceed with Cephalosporin use.   Marland Kitchen Lisinopril-Hydrochlorothiazide Other (See Comments) and Cough    Pt's PCP took her off medication because of possible kidney damage  . Sulfa  Antibiotics Other (See Comments)    Unknown reaction-- childhood allergy   . Latex Rash    No current facility-administered medications on file prior to encounter.    Current Outpatient Medications on File Prior to Encounter  Medication Sig  . acetaminophen (TYLENOL) 325 MG tablet Take 2 tablets (650 mg total) by mouth every 6 (six) hours as needed for mild pain (or Fever >/= 101).  Marland Kitchen alendronate (FOSAMAX) 35 MG tablet Take 35 mg by mouth every 7 (seven) days.   Marland Kitchen allopurinol (ZYLOPRIM) 100 MG tablet Take 200 mg by mouth daily.  . bisacodyl (DULCOLAX) 10 MG suppository Place 10 mg rectally as needed for moderate constipation.  . budesonide-formoterol (SYMBICORT) 160-4.5 MCG/ACT inhaler Inhale 2 puffs into the lungs 2 (two) times daily.  . cefTRIAXone (ROCEPHIN) IVPB Inject 2 g into the vein daily. Indication:  Osteomyelitis Last Day of Therapy:  05/14/17 Labs - Once weekly:  CBC/D and BMP, Labs - Every other week:  ESR and CRP  . feeding supplement, ENSURE ENLIVE, (ENSURE ENLIVE) LIQD Take 237 mLs by mouth 2 (two) times daily between meals.  . furosemide (LASIX) 40 MG tablet Take 1 tablet (40 mg total) by mouth 2 (two) times daily.  . insulin aspart (NOVOLOG) 100 UNIT/ML injection Inject 0-9 Units into the skin 3 (three) times daily with meals. Sliding scale CBG 70 - 120: 0 units CBG 121 - 150: 1 unit,  CBG 151 - 200: 2 units,  CBG 201 - 250: 3 units,  CBG 251 - 300: 5 units,  CBG 301 - 350: 7 units,  CBG 351 - 400: 9  units   CBG > 400: 9 units and notify your MD  . Liniments (SALONPAS PAIN RELIEF PATCH EX) Apply 1 patch topically daily as needed (PAIN).  . magnesium hydroxide (MILK OF MAGNESIA) 400 MG/5ML suspension Take 30 mLs by mouth daily as needed for mild constipation.  . metroNIDAZOLE (FLAGYL) 500 MG tablet Take 1 tablet (500 mg total) by mouth 3 (three) times daily. Indication:  Osteomyelitis Last Day of Therapy:  05/14/17 Labs - Once weekly:  CBC/D and BMP, Labs - Every other week:   ESR and CRP  . Multiple Vitamin (MULTIVITAMIN WITH MINERALS) TABS tablet Take 1 tablet by mouth daily.  Marland Kitchen nystatin-triamcinolone (MYCOLOG II) cream Apply 1 application topically 2 (two) times daily as needed (rash). Reported on 07/30/2015  . Polyethyl Glyc-Propyl Glyc PF (SYSTANE ULTRA PF) 0.4-0.3 % SOLN Place 1 drop into both eyes 2 (two) times daily. "SYSTANE"  . sildenafil (REVATIO) 20 MG tablet Take 1 tablet (20 mg total) by mouth 3 (three) times daily.  Marland Kitchen albuterol (PROAIR HFA) 108 (90 Base) MCG/ACT inhaler Inhale 2 puffs into the lungs every 6 (six) hours as needed for wheezing or shortness of breath.  . fluticasone (FLONASE) 50 MCG/ACT nasal spray Place 2 sprays into both nostrils daily as needed for allergies or rhinitis.    FAMILY HISTORY:  Her indicated that her mother is deceased. She indicated that her father is deceased. She indicated that her brother is alive. She indicated that her maternal grandmother is deceased. She indicated that her maternal grandfather is deceased. She indicated that her paternal grandmother is deceased. She indicated that her paternal grandfather is deceased. She indicated that the status of her other is unknown.   SOCIAL HISTORY: She  reports that  has never smoked. she has never used smokeless tobacco. She reports that she does not drink alcohol or use drugs.  REVIEW OF SYSTEMS:   Unattainable as patient is demented.  SUBJECTIVE:  Leg pain but no other abnormalities  VITAL SIGNS: BP 93/72   Pulse 62   Temp 97.9 F (36.6 C) (Core)   Resp 16   Ht _0  (1.651 m)   Wt 45.4 kg (100 lb)   SpO2 94%   BMI 16.64 kg/m   HEMODYNAMICS:    VENTILATOR SETTINGS:    INTAKE / OUTPUT: No intake/output data recorded.  PHYSICAL EXAMINATION: General:  Chronically ill appearing elderly female, NAD Neuro:  Awake and interactive but clearly confused, moving all ext to command. HEENT:  [place]/AT, PERRL, EOM-I Cardiovascular:  IRIR, Nl S1/S2 and  -M/R/G. Lungs:  Bibasilar crackles. Abdomen:  Soft, NT, ND and +BS Musculoskeletal:  -edema and -tenderness Skin:  Intact  LABS:  BMET Recent Labs  Lab 04/24/17 1220  NA 139  K 3.9  CL 107  CO2 23  BUN 50*  CREATININE 1.13*  GLUCOSE 91    Electrolytes Recent Labs  Lab 04/24/17 1220  CALCIUM 5.2*    CBC Recent Labs  Lab 04/24/17 1045  WBC 29.9*  HGB 13.0  HCT 39.6  PLT 399    Coag's No results for input(s): APTT, INR in the last 168 hours.  Sepsis Markers Recent Labs  Lab 04/24/17 1115  LATICACIDVEN 4.98*    ABG No results for input(s): PHART, PCO2ART, PO2ART in the last 168 hours.  Liver Enzymes Recent Labs  Lab 04/24/17 1220  AST 26  ALT 9*  ALKPHOS 134*  BILITOT 0.6  ALBUMIN 1.2*    Cardiac Enzymes No results for input(s): TROPONINI,  PROBNP in the last 168 hours.  Glucose Recent Labs  Lab 04/24/17 1108  GLUCAP 103*    Imaging Dg Chest Portable 1 View  Result Date: 04/24/2017 CLINICAL DATA:  Altered mental status. EXAM: PORTABLE CHEST 1 VIEW COMPARISON:  Chest x-ray 04/11/2017.  CT 04/08/2017 . FINDINGS: Right PICC line in stable position. Mediastinum hilar structures are normal. Heart size stable. Stable elevation left hemidiaphragm with stable left basilar atelectasis. Stable mild right base atelectasis. Thoracolumbar spine scoliosis. Air-filled scratched it air-containing structures over the left upper abdomen most likely represent stomach and colon. reference is made to prior CT report of 04/08/2017 . IMPRESSION: 1. Right PICC line stable position. 2. Stable elevation left hemidiaphragm with stable atelectasis left base. Stable mild right base subsegmental atelectasis. Electronically Signed   By: Marcello Moores  Register   On: 04/24/2017 11:46     STUDIES:  Leg X-ray 12/21>>>  CULTURES: Blood 12/21>>> Urine 12/21>>> Sputum 12/21>>>  ANTIBIOTICS: Cefepime 12/21>>> Vanc 12/21>>>  SIGNIFICANT EVENTS:  12/21>>> admission for septic  shock  LINES/TUBES: R sided single lumen PICC from outside the hospital PIV  I reviewed CXR myself, no infiltrate noted  DISCUSSION: 83 year old female with PMH above, presenting with septic shock likely from leg wound source and ?osteo.  Discussed with EDP and PCCM-NP.  ASSESSMENT / PLAN:  PULMONARY A: Hypoxemia P:   Titrate O2 for sat of 88-92% Monitor for airway protection DNI  CARDIOVASCULAR A:  Septic shock from infection A-fib with RVR P:  - DNR - No pressors - IVF resuscitation - Abx as below - Would recommend changing the PICC line to a double or triple lumen picc in the left  RENAL A:   BUN:Cr ratio of 50:1, dehydrated P:   - IVF resuscitation - Replace electrolytes as indicated - BMET in AM  GASTROINTESTINAL A:   No active issues P:   - Monitor - NPO for now  HEMATOLOGIC A:   Leukocytosis P:  - CBC in AM - Transfuse per ICU protocol  INFECTIOUS A:   Septic leg P:   - Cefepime - Vanc - Pan culture  ENDOCRINE A:   ?low cortisol   P:   - Cortisol level - Stress dose steroid  NEUROLOGIC A:   Dementia Altered due to sepsis P:   Hold all sedation  FAMILY  - Updates: Patient wants to be DNR but is unreliable given history of dementia.  Spoke with her partner (only living relative that is able to make decision, per partner, her brother has prostate cancer with mets and unable to make decisions) of 50 years.  Stated that patient went to her lawyer and made herself DNR but she is unable to obtain the papers at this time.  Given good faith, will make patient a full DNR.  Ok to admit to SDU with fluid resuscitation via [place].  PCCM will sign off, please call back if needed.  - Inter-disciplinary family meet or Palliative Care meeting due by:  day 7  The patient is critically ill with multiple organ systems failure and requires high complexity decision making for assessment and support, frequent evaluation and titration of therapies,  application of advanced monitoring technologies and extensive interpretation of multiple databases.   Critical Care Time devoted to patient care services described in this note is  45  Minutes. This time reflects time of care of this signee Dr Jennet Maduro. This critical care time does not reflect procedure time, or teaching time or supervisory time of  PA/NP/Med student/Med Resident etc but could involve care discussion time.  Rush Farmer, M.D. [facility] Pulmonary/Critical Care Medicine. Pager: [phone]. After hours pager: [phone].  04/24/2017, 2:34 PM

## 2017-04-24 NOTE — Progress Notes (Signed)
Pharmacy Antibiotic Note  Angel Estrada is a 83 y.o. female admitted on 04/24/2017 with sepsis.  Pharmacy has been consulted for meropenem and vancomycin dosing. WBC elevated at 29.9. LA 4.98. CrCl ~ 30 mL/min    Plan: Merrem 1 gm IV Q 12 hours Vancomycin 1 gm IV load, then Vancomycin 750 mg IV Q 24 hours Monitor CBC, renal fx, cultures and clinical progress VT at SS   Height: _0  (165.1 cm) Weight: 100 lb (45.4 kg) IBW/kg (Calculated) : 57  Temp (24hrs), Avg:98.1 F (36.7 C), Min:97.5 F (36.4 C), Max:99.3 F (37.4 C)  Recent Labs  Lab 04/24/17 1045 04/24/17 1115 04/24/17 1220  WBC 29.9*  --   --   CREATININE  --   --  1.13*  LATICACIDVEN  --  4.98*  --     Estimated Creatinine Clearance: 28 mL/min (A) (by C-G formula based on SCr of 1.13 mg/dL (H)).    Allergies  Allergen Reactions  . Penicillins Swelling and Rash    Site of swelling (?) Has patient had a PCN reaction causing immediate rash, facial/tongue/throat swelling, SOB or lightheadedness with hypotension: Yes Has patient had a PCN reaction causing severe rash involving mucus membranes or skin necrosis: No Has patient had a PCN reaction that required hospitalization: No Has patient had a PCN reaction occurring within the last 10 years: No If all of the above answers are "NO", then may proceed with Cephalosporin use.   Marland Kitchen Lisinopril-Hydrochlorothiazide Other (See Comments) and Cough    Pt's PCP took her off medication because of possible kidney damage  . Sulfa Antibiotics Other (See Comments)    Unknown reaction-- childhood allergy   . Latex Rash    Antimicrobials this admission: Merem 12/21 >>  Vanc 12/21 >>   Dose adjustments this admission: None  Microbiology results: 12/21 BCx:  1/221 UCx:    Thank you for allowing pharmacy to be a part of this patient's care.  Albertina Parr, PharmD., BCPS Clinical Pharmacist Pager [phone]

## 2017-04-24 NOTE — ED Provider Notes (Signed)
[facility] EMERGENCY DEPARTMENT Provider Note   CSN: [id number] Arrival date & time: 04/24/17  1031     History   Chief Complaint Chief Complaint  Patient presents with  . Altered Mental Status    HPI Angel Estrada is a 83 y.o. female.  HPI Level 5 caveat due to altered mental status. Patient brought in for altered mental status.  Reportedly has not been eating and drinking.  Recent admission to hospital for infection of left lower leg and is off IV antibiotics.  Has been reportedly less oral intake.  Patient states she feels bad but does not have any specific complaints. Past Medical History:  Diagnosis Date  . CHF (congestive heart failure) ([place])   . COPD (chronic obstructive pulmonary disease) ([place])   . Depression   . Diabetes mellitus without complication ([place])   . Hypertension   . Osteoporosis     Patient Active Problem List   Diagnosis Date Noted  . Sepsis affecting skin ([place]) 04/24/2017  . New onset atrial fibrillation ([employer]) 04/24/2017  . Diabetes mellitus without complication ([place]) 18/29/9371  . Acute kidney injury ([place]) 04/24/2017  . HTN (hypertension) 04/24/2017  . Pulmonary hypertension due to COPD ([place]) 04/24/2017  . Acute urinary retention 04/24/2017  . Dementia (suspected) 04/24/2017  . Leg edema, left 04/24/2017  . Pressure injury of skin 04/07/2017  . Left leg cellulitis 04/07/2017  . Chronic diastolic CHF (congestive heart failure) ([place]) 04/07/2017  . Severe malnutrition ([place]) 04/07/2017  . 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 Abscess 03/25/2017  . Pulmonary hypertension ([place]) 03/25/2017  . Exertional dyspnea 02/13/2016  . Idiopathic scoliosis 02/13/2016  . CHF exacerbation ([place]) 07/31/2015  . Hypertension 07/30/2015  . COPD (chronic obstructive pulmonary disease) ([place]) 07/30/2015  . Diabetes mellitus ([place]) 07/30/2015  . Acute on chronic diastolic CHF (congestive heart failure), NYHA class 1 ([place]) 07/30/2015    Past  Surgical History:  Procedure Laterality Date  . APPLICATION OF A-CELL OF EXTREMITY Left 04/08/2017   Procedure: APPLICATION OF A-CELL OF EXTREMITY;  Surgeon: Wallace Going, DO;  Location: [facility];  Service: Plastics;  Laterality: Left;  . APPLICATION OF WOUND VAC Left 04/08/2017   Procedure: APPLICATION OF WOUND VAC;  Surgeon: Wallace Going, DO;  Location: [facility];  Service: Plastics;  Laterality: Left;  . CATARACT EXTRACTION Bilateral   . I&D EXTREMITY Left 04/08/2017   Procedure: IRRIGATION AND DEBRIDEMENT EXTREMITY;  Surgeon: Wallace Going, DO;  Location: [facility];  Service: Plastics;  Laterality: Left;  . US ECHOCARDIOGRAPHY      OB History    No data available       Home Medications    Prior to Admission medications   Medication Sig Start Date End Date Taking? Authorizing Provider  acetaminophen (TYLENOL) 325 MG tablet Take 2 tablets (650 mg total) by mouth every 6 (six) hours as needed for mild pain (or Fever >/= 101). 04/11/17  Yes Rai, Ripudeep K, MD  alendronate (FOSAMAX) 35 MG tablet Take 35 mg by mouth every 7 (seven) days.  12/30/16  Yes [provider]  allopurinol (ZYLOPRIM) 100 MG tablet Take 200 mg by mouth daily. 01/23/17  Yes [provider]  bisacodyl (DULCOLAX) 10 MG suppository Place 10 mg rectally as needed for moderate constipation.   Yes [provider]  budesonide-formoterol (SYMBICORT) 160-4.5 MCG/ACT inhaler Inhale 2 puffs into the lungs 2 (two) times daily.   Yes [provider]  cefTRIAXone (ROCEPHIN) IVPB Inject 2 g  into the vein daily. Indication:  Osteomyelitis Last Day of Therapy:  05/14/17 Labs - Once weekly:  CBC/D and BMP, Labs - Every other week:  ESR and CRP 04/11/17 05/15/17 Yes Rai, Ripudeep K, MD  feeding supplement, ENSURE ENLIVE, (ENSURE ENLIVE) LIQD Take 237 mLs by mouth 2 (two) times daily between meals. 04/11/17  Yes Rai, Ripudeep K, MD  furosemide (LASIX) 40 MG tablet Take 1 tablet (40 mg total) by  mouth 2 (two) times daily. 04/11/17  Yes Rai, Ripudeep K, MD  insulin aspart (NOVOLOG) 100 UNIT/ML injection Inject 0-9 Units into the skin 3 (three) times daily with meals. Sliding scale CBG 70 - 120: 0 units CBG 121 - 150: 1 unit,  CBG 151 - 200: 2 units,  CBG 201 - 250: 3 units,  CBG 251 - 300: 5 units,  CBG 301 - 350: 7 units,  CBG 351 - 400: 9 units   CBG > 400: 9 units and notify your MD 04/11/17  Yes Rai, Ripudeep K, MD  Liniments (SALONPAS PAIN RELIEF PATCH EX) Apply 1 patch topically daily as needed (PAIN).   Yes [provider]  magnesium hydroxide (MILK OF MAGNESIA) 400 MG/5ML suspension Take 30 mLs by mouth daily as needed for mild constipation.   Yes [provider]  metroNIDAZOLE (FLAGYL) 500 MG tablet Take 1 tablet (500 mg total) by mouth 3 (three) times daily. Indication:  Osteomyelitis Last Day of Therapy:  05/14/17 Labs - Once weekly:  CBC/D and BMP, Labs - Every other week:  ESR and CRP 04/11/17 05/15/17 Yes Rai, Ripudeep K, MD  Multiple Vitamin (MULTIVITAMIN WITH MINERALS) TABS tablet Take 1 tablet by mouth daily. 04/11/17  Yes Rai, Ripudeep K, MD  nystatin-triamcinolone (MYCOLOG II) cream Apply 1 application topically 2 (two) times daily as needed (rash). Reported on 07/30/2015 07/02/15  Yes [provider]  Polyethyl Glyc-Propyl Glyc PF (SYSTANE ULTRA PF) 0.4-0.3 % SOLN Place 1 drop into both eyes 2 (two) times daily. "SYSTANE"   Yes [provider]  sildenafil (REVATIO) 20 MG tablet Take 1 tablet (20 mg total) by mouth 3 (three) times daily. 07/28/16  Yes Josue Hector, MD  albuterol (PROAIR HFA) 108 (90 Base) MCG/ACT inhaler Inhale 2 puffs into the lungs every 6 (six) hours as needed for wheezing or shortness of breath.    [provider]  fluticasone (FLONASE) 50 MCG/ACT nasal spray Place 2 sprays into both nostrils daily as needed for allergies or rhinitis.    [provider]    Family History Family History  Problem Relation  Age of Onset  . Diabetes type II Mother   . Hypertension Mother   . Brain cancer Father   . Hypertension Father   . Diabetes type II Brother   . Stroke Other     Social History Social History   Tobacco Use  . Smoking status: Never Smoker  . Smokeless tobacco: Never Used  Substance Use Topics  . Alcohol use: No  . Drug use: No     Allergies   Penicillins; Lisinopril-hydrochlorothiazide; Sulfa antibiotics; and Latex   Review of Systems Review of Systems  Unable to perform ROS: Dementia     Physical Exam Updated Vital Signs BP (!) 78/58 (BP Location: Right Arm)   Pulse 62   Temp 97.9 F (36.6 C)   Resp 19   Ht _0  (1.651 m)   Wt 45.4 kg (100 lb)   SpO2 94%   BMI 16.64 kg/m  Physical Exam  Constitutional:  Patient is somewhat cachectic.  HENT:  Mucous membranes dry  Eyes: Pupils are equal, round, and reactive to light.  Neck: Neck supple.  Cardiovascular:  Irregular tachycardia  Pulmonary/Chest: She has no wheezes. She has no rales.  Abdominal: She exhibits mass. There is no tenderness.  Large suprapubic mass that is tender.  Musculoskeletal:  Wound VAC to left lower extremity.  Minimal surrounding erythema.  PICC line to right upper extremity  Neurological: She is alert.  Awake and appropriate but some mild confusion.  Skin: Skin is warm. Capillary refill takes less than 2 seconds.     ED Treatments / Results  Labs (all labs ordered are listed, but only abnormal results are displayed) Labs Reviewed  CBC - Abnormal; Notable for the following components:      Result Value   WBC 29.9 (*)    RDW 17.4 (*)    All other components within normal limits  URINALYSIS, ROUTINE W REFLEX MICROSCOPIC - Abnormal; Notable for the following components:   Hgb urine dipstick SMALL (*)    All other components within normal limits  COMPREHENSIVE METABOLIC PANEL - Abnormal; Notable for the following components:   BUN 50 (*)    Creatinine, Ser 1.13 (*)     Calcium 5.2 (*)    Total Protein 3.4 (*)    Albumin 1.2 (*)    ALT 9 (*)    Alkaline Phosphatase 134 (*)    GFR calc non Af Amer 44 (*)    GFR calc Af Amer 51 (*)    All other components within normal limits  CBG MONITORING, ED - Abnormal; Notable for the following components:   Glucose-Capillary 103 (*)    All other components within normal limits  I-STAT CG4 LACTIC ACID, ED - Abnormal; Notable for the following components:   Lactic Acid, Venous 4.98 (*)    All other components within normal limits  POC OCCULT BLOOD, ED - Abnormal; Notable for the following components:   Fecal Occult Bld POSITIVE (*)    All other components within normal limits  I-STAT CG4 LACTIC ACID, ED - Abnormal; Notable for the following components:   Lactic Acid, Venous 2.84 (*)    All other components within normal limits  URINE CULTURE  CULTURE, BLOOD (ROUTINE X 2)  CULTURE, BLOOD (ROUTINE X 2)  URINE CULTURE  CORTISOL  LACTIC ACID, PLASMA  LACTIC ACID, PLASMA  PROCALCITONIN  C-REACTIVE PROTEIN  CK  URINALYSIS, ROUTINE W REFLEX MICROSCOPIC    EKG  EKG Interpretation  Date/Time:  Friday April 24 2017 10:51:02 EST Ventricular Rate:  127 PR Interval:    QRS Duration: 111 QT Interval:  325 QTC Calculation: 469 R Axis:   157 Text Interpretation:  Atrial fibrillation vs flutter vs artifact Low voltage, extremity leads Right ventricular hypertrophy Artifact in lead(s) I II aVR Confirmed by Davonna Belling [phone]) on 04/24/2017 10:55:21 AM       Radiology Dg Chest Portable 1 View  Result Date: 04/24/2017 CLINICAL DATA:  Altered mental status. EXAM: PORTABLE CHEST 1 VIEW COMPARISON:  Chest x-ray 04/11/2017.  CT 04/08/2017 . FINDINGS: Right PICC line in stable position. Mediastinum hilar structures are normal. Heart size stable. Stable elevation left hemidiaphragm with stable left basilar atelectasis. Stable mild right base atelectasis. Thoracolumbar spine scoliosis. Air-filled scratched it  air-containing structures over the left upper abdomen most likely represent stomach and colon. reference is made to prior CT report of 04/08/2017 . IMPRESSION: 1. Right PICC line  stable position. 2. Stable elevation left hemidiaphragm with stable atelectasis left base. Stable mild right base subsegmental atelectasis. Electronically Signed   By: Marcello Moores  Register   On: 04/24/2017 11:46    Procedures Procedures (including critical care time)  Medications Ordered in ED Medications  meropenem (MERREM) 1 g in sodium chloride 0.9 % 100 mL IVPB (0 g Intravenous Stopped 04/24/17 1451)  vancomycin (VANCOCIN) IVPB 1000 mg/200 mL premix (1,000 mg Intravenous New Bag/Given 04/24/17 1450)  vancomycin (VANCOCIN) IVPB 750 mg/150 ml premix (not administered)  hydrocortisone sodium succinate (SOLU-CORTEF) 100 MG injection 50 mg (not administered)  morphine 4 MG/ML injection 1-4 mg (not administered)  enoxaparin (LOVENOX) injection 40 mg (not administered)  acetaminophen (TYLENOL) tablet 650 mg (not administered)    Or  acetaminophen (TYLENOL) suppository 650 mg (not administered)  ondansetron (ZOFRAN) tablet 4 mg (not administered)    Or  ondansetron (ZOFRAN) injection 4 mg (not administered)  0.9 %  sodium chloride infusion (not administered)  insulin aspart (novoLOG) injection 0-9 Units (not administered)  0.9 %  sodium chloride infusion ( Intravenous Stopped 04/24/17 1316)  sodium chloride 0.9 % bolus 500 mL (0 mLs Intravenous Stopped 04/24/17 1435)     Initial Impression / Assessment and Plan / ED Course  I have reviewed the triage vital signs and the nursing notes.  Pertinent labs & imaging results that were available during my care of the patient were reviewed by me and considered in my medical decision making (see chart for details).     Patient with mental status changes.  White count is elevated.  No temperature change but easily could be a sepsis.  Urine does not show infection.  Could be  infection from the left lower leg wound.  X-ray does not show pneumonia.  Has had some persistent tachycardia and persistent hypotension.  30/kg fluid bolus given.  Code sepsis called initially somewhat delayed due to lack of fever and no clear source of infection.  Discussed with infectious disease and will start vancomycin and aztreonam.  She was already on antibiotics as an outpatient through the IV.  Continued hypotension.  Admit to ICU.  CRITICAL CARE Performed by: Davonna Belling Total critical care time: 40 minutes Critical care time was exclusive of separately billable procedures and treating other patients. Critical care was necessary to treat or prevent imminent or life-threatening deterioration. Critical care was time spent personally by me on the following activities: development of treatment plan with patient and/or surrogate as well as nursing, discussions with consultants, evaluation of patient's response to treatment, examination of patient, obtaining history from patient or surrogate, ordering and performing treatments and interventions, ordering and review of laboratory studies, ordering and review of radiographic studies, pulse oximetry and re-evaluation of patient's condition.   Final Clinical Impressions(s) / ED Diagnoses   Final diagnoses:  Sepsis, due to unspecified organism [facility])  Hypokalemia  Urinary retention    ED Discharge Orders    None       Davonna Belling, MD 04/24/17 1536

## 2017-04-24 NOTE — ED Notes (Signed)
Per patients partner patient has no history of previous A-Fib or lower blood pressure. MD notified.

## 2017-04-24 NOTE — ED Notes (Signed)
ED Provider at bedside. 

## 2017-04-24 NOTE — ED Notes (Signed)
CBG: 103. RN notified

## 2017-04-24 NOTE — ED Notes (Signed)
Pt lying in bed resting with no complications at this time; IV fluids and abt running thru PICC RUE; A/Ox2 with some forgetfulness; wd vac to LLE however not connected to vac upon SN assessment; pt has temp probe foley in place with dark amber drainage and patent.

## 2017-04-24 NOTE — Progress Notes (Signed)
Paged MD concerning pt BP of 78/60 manual. 546m Bolus ordered and to page again if map drops below 60.

## 2017-04-24 NOTE — ED Notes (Signed)
Pt here with wound vac dressing to LLE however no vacuum present; consult to wound care staff ordered

## 2017-04-24 NOTE — ED Triage Notes (Signed)
Patient's family states that patient is altered from normal behavior. Patient states that "she doesn't quite feel right"

## 2017-04-24 NOTE — ED Notes (Signed)
Per MD second set of cultures drawn from PICC line.

## 2017-04-24 NOTE — ED Notes (Signed)
[name] [phone] is patients partner and is authorized to receive information. Please keep her updated when possible.

## 2017-04-24 NOTE — Progress Notes (Signed)
CRITICAL VALUE ALERT  Critical Value:  Lactic acid 2.2  Date & Time Notied:  04/24/17 2210  Provider Notified: Bodenhiemer  Orders Received/Actions taken: Pt receiving bolus

## 2017-04-25 ENCOUNTER — Inpatient Hospital Stay (HOSPITAL_COMMUNITY): Payer: Medicare Other

## 2017-04-25 DIAGNOSIS — R609 Edema, unspecified: Secondary | ICD-10-CM

## 2017-04-25 DIAGNOSIS — E872 Acidosis: Secondary | ICD-10-CM

## 2017-04-25 DIAGNOSIS — L03115 Cellulitis of right lower limb: Secondary | ICD-10-CM

## 2017-04-25 DIAGNOSIS — I959 Hypotension, unspecified: Secondary | ICD-10-CM

## 2017-04-25 DIAGNOSIS — Z8249 Family history of ischemic heart disease and other diseases of the circulatory system: Secondary | ICD-10-CM

## 2017-04-25 DIAGNOSIS — Z833 Family history of diabetes mellitus: Secondary | ICD-10-CM

## 2017-04-25 DIAGNOSIS — Z66 Do not resuscitate: Secondary | ICD-10-CM

## 2017-04-25 DIAGNOSIS — E43 Unspecified severe protein-calorie malnutrition: Secondary | ICD-10-CM

## 2017-04-25 DIAGNOSIS — R4182 Altered mental status, unspecified: Secondary | ICD-10-CM

## 2017-04-25 DIAGNOSIS — E119 Type 2 diabetes mellitus without complications: Secondary | ICD-10-CM

## 2017-04-25 LAB — COMPREHENSIVE METABOLIC PANEL
ALBUMIN: 1.7 g/dL — AB (ref 3.5–5.0)
ALT: 12 U/L — AB (ref 14–54)
ANION GAP: 15 (ref 5–15)
AST: 28 U/L (ref 15–41)
Alkaline Phosphatase: 177 U/L — ABNORMAL HIGH (ref 38–126)
BUN: 57 mg/dL — ABNORMAL HIGH (ref 6–20)
CHLORIDE: 97 mmol/L — AB (ref 101–111)
CO2: 26 mmol/L (ref 22–32)
CREATININE: 1.23 mg/dL — AB (ref 0.44–1.00)
Calcium: 7.1 mg/dL — ABNORMAL LOW (ref 8.9–10.3)
GFR calc non Af Amer: 40 mL/min — ABNORMAL LOW (ref >60)
GFR, EST AFRICAN AMERICAN: 46 mL/min — AB (ref >60)
GLUCOSE: 86 mg/dL (ref 65–99)
Potassium: 4.2 mmol/L (ref 3.5–5.1)
SODIUM: 138 mmol/L (ref 135–145)
Total Bilirubin: 0.9 mg/dL (ref 0.3–1.2)
Total Protein: 4.4 g/dL — ABNORMAL LOW (ref 6.5–8.1)

## 2017-04-25 LAB — URINALYSIS, ROUTINE W REFLEX MICROSCOPIC
BILIRUBIN URINE: NEGATIVE
Glucose, UA: NEGATIVE mg/dL
KETONES UR: NEGATIVE mg/dL
Nitrite: NEGATIVE
PH: 5 (ref 5.0–8.0)
Protein, ur: NEGATIVE mg/dL
SQUAMOUS EPITHELIAL / LPF: NONE SEEN
Specific Gravity, Urine: 1.016 (ref 1.005–1.030)

## 2017-04-25 LAB — URINE CULTURE: CULTURE: NO GROWTH

## 2017-04-25 LAB — GLUCOSE, CAPILLARY
GLUCOSE-CAPILLARY: 79 mg/dL (ref 65–99)
GLUCOSE-CAPILLARY: 88 mg/dL (ref 65–99)
GLUCOSE-CAPILLARY: 77 mg/dL (ref 65–99)
Glucose-Capillary: 102 mg/dL — ABNORMAL HIGH (ref 65–99)
Glucose-Capillary: 116 mg/dL — ABNORMAL HIGH (ref 65–99)
Glucose-Capillary: 64 mg/dL — ABNORMAL LOW (ref 65–99)
Glucose-Capillary: 81 mg/dL (ref 65–99)

## 2017-04-25 LAB — CBC
HCT: 33.6 % — ABNORMAL LOW (ref 36.0–46.0)
HEMOGLOBIN: 11.4 g/dL — AB (ref 12.0–15.0)
MCH: 32.6 pg (ref 26.0–34.0)
MCHC: 33.9 g/dL (ref 30.0–36.0)
MCV: 96 fL (ref 78.0–100.0)
Platelets: 349 10*3/uL (ref 150–400)
RBC: 3.5 MIL/uL — AB (ref 3.87–5.11)
RDW: 18.2 % — ABNORMAL HIGH (ref 11.5–15.5)
WBC: 32.1 10*3/uL — ABNORMAL HIGH (ref 4.0–10.5)

## 2017-04-25 LAB — C DIFFICILE QUICK SCREEN W PCR REFLEX
C DIFFICILE (CDIFF) INTERP: NOT DETECTED
C Diff antigen: NEGATIVE
C Diff toxin: NEGATIVE

## 2017-04-25 MED ORDER — ACETAMINOPHEN 325 MG PO TABS
650.0000 mg | ORAL_TABLET | Freq: Four times a day (QID) | ORAL | Status: DC | PRN
Start: 2017-04-25 — End: 2017-04-25

## 2017-04-25 MED ORDER — ALBUTEROL SULFATE (2.5 MG/3ML) 0.083% IN NEBU: 3.0000 mL | INHALATION_SOLUTION | RESPIRATORY_TRACT | Status: DC | PRN

## 2017-04-25 MED ORDER — DEXTROSE 50 % IV SOLN
INTRAVENOUS | Status: AC
  Administered 2017-04-25: 25 mL
  Filled 2017-04-25: qty 50

## 2017-04-25 MED ORDER — SODIUM CHLORIDE 0.9 % IV BOLUS (SEPSIS)
250.0000 mL | Freq: Once | INTRAVENOUS | Status: AC
  Administered 2017-04-25: 250 mL via INTRAVENOUS

## 2017-04-25 MED ORDER — HEPARIN (PORCINE) IN NACL 100-0.45 UNIT/ML-% IJ SOLN
1100.0000 [IU]/h | INTRAMUSCULAR | Status: DC
  Administered 2017-04-25 – 2017-04-26 (×2): 800 [IU]/h via INTRAVENOUS
  Administered 2017-04-27: 1000 [IU]/h via INTRAVENOUS
  Administered 2017-04-28: 1100 [IU]/h via INTRAVENOUS
  Filled 2017-04-25 (×4): qty 250

## 2017-04-25 NOTE — Progress Notes (Signed)
Pt's BP is 72/61, Temp is 97.4. MD made aware.

## 2017-04-25 NOTE — Progress Notes (Signed)
ANTICOAGULATION CONSULT NOTE - Initial Consult  Pharmacy Consult for heparin Indication: DVT  Allergies  Allergen Reactions  . Penicillins Swelling and Rash    Site of swelling (?) Has patient had a PCN reaction causing immediate rash, facial/tongue/throat swelling, SOB or lightheadedness with hypotension: Yes Has patient had a PCN reaction causing severe rash involving mucus membranes or skin necrosis: No Has patient had a PCN reaction that required hospitalization: No Has patient had a PCN reaction occurring within the last 10 years: No If all of the above answers are "NO", then may proceed with Cephalosporin use.   Marland Kitchen Lisinopril-Hydrochlorothiazide Other (See Comments) and Cough    Pt's PCP took her off medication because of possible kidney damage  . Sulfa Antibiotics Other (See Comments)    Unknown reaction-- childhood allergy   . Latex Rash    Patient Measurements: Height: _0  (165.1 cm) Weight: 122 lb 9.2 oz (55.6 kg) IBW/kg (Calculated) : 57 Heparin Dosing Weight: 45kg  Vital Signs: Temp: 98.3 F (36.8 C) (12/22 1709) Temp Source: Oral (12/22 1709) BP: 83/66 (12/22 1612) Pulse Rate: 73 (12/22 1612)  Labs: Recent Labs    04/24/17 1045 04/24/17 1220 04/24/17 1654 04/25/17 0750  HGB 13.0  --   --  11.4*  HCT 39.6  --   --  33.6*  PLT 399  --   --  349  CREATININE  --  1.13*  --  1.23*  CKTOTAL  --   --  37*  --     Estimated Creatinine Clearance: 31.5 mL/min (A) (by C-G formula based on SCr of 1.23 mg/dL (H)).  Assessment: 70 yof to start IV heparin for acute DVT. Of note, pt with FOB+ so will not bolus. H/H slightly low and platelets are WNL. Pt received prophylactic lovenox dose just prior to starting IV heparin.   Goal of Therapy:  Heparin level 0.3-0.7 units/ml Monitor platelets by anticoagulation protocol: Yes   Plan:  Heparin gtt 800 units/hr Check an 8 hr heparin level  Daily heparin level and CBC  Kanylah Muench, Rande Lawman 04/25/2017,6:39  PM

## 2017-04-25 NOTE — Progress Notes (Signed)
[facility] TEAM 1 - Stepdown/ICU TEAM  Cecylia Brazill  ZDG:[id number] DOB: Jan 16, 1934 DOA: 04/24/2017 PCP: Jani Gravel, MD    Brief Narrative:  83yo F w/ a hx of COPD with pulmonary hypertension, grade 1 diastolic dysfunction, dementia, and DM who was discharged to a SNF after an admission for a L LE cellulitis with sepsis, and returned to the ED this admit with AMS following several days of very poor intake.  During prior hospitalization the patient underwent surgical debridement and wound VAC placement.  ID was following during the hospitalization and recommended 6 weeks of IV Rocephin with Flagyl with last dose 05/14/17.  Patient was discharged with a PICC line in place in the right upper extremity.  Upon presentation this admit pt was found to have a blood pressure of 80/68, pulse 125, and new onset atrial fibrillation.  Labs revealed mild acute kidney injury, lactate of 4.98, white count 29,900.  Patient was noted to have acute urinary retention and a Foley catheter was placed with about 2 L returned.    Significant Events: 12/21 admit - made DNR/NCB  Subjective: The pt is becoming more alert.  She is somewhat agitated when I attempt to question her, and is not able to provide a reliable hx.  There is no family present at the time of my visit.    Assessment & Plan:  Severe Sepsis of unclear etiology - ?L LE cellulitis  BP has only modestly improved w/ volume expansion - stress dose steroid have not had an appreciable effect so will stop them - L LE wound felt to be most likely source - cont empiric abx and follow   New onset atrial fibrillation CHA2DS2 VASc score is 4 - remains tachycardic - cont to hydrate as primary tx for now as HR does appear to be trending downward - to begin heparin gtt w/ confirmed L LE DVT   Extensive L LE DVT Raises concern for possible PE - avoid CTa chest now due to worsening renal fxn and clinical instability - fully anticoag w/ IV heparin and follow closely in  pt who is guaiac +  Acute kidney injury Baseline crt 0.73 - crt 1.13 at admission - follow trend w/ volume resuscitation Recent Labs  Lab 04/24/17 1220 04/25/17 0750  CREATININE 1.13* 1.23*    Acute urinary retention Keep foley in place for now   Guaiac positive No evidence of large scale signif GIB - follow closely w/ dual indication for full anticoag   COPD with pulmonary hypertension Compensated at this time   Suspected mild dementia   DM 2 CBG controlled w/ very limited/no intake   HTN Not an active problem at this time   DVT prophylaxis: lovenox  Code Status:  DNR - NO CODE Family Communication: no family present at time of exam  Disposition Plan: SDU  Consultants:  ID PCCM  Antimicrobials:  Meropenem 12/21 > Vanc 12/21 >   Objective: Blood pressure 93/64, pulse (!) 110, temperature 98.1 F (36.7 C), temperature source Oral, resp. rate 19, height _0  (1.651 m), weight 55.6 kg (122 lb 9.2 oz), SpO2 100 %.  Intake/Output Summary (Last 24 hours) at 04/25/2017 1402 Last data filed at 04/25/2017 1214 Gross per 24 hour  Intake 400 ml  Output 650 ml  Net -250 ml   Filed Weights   04/24/17 1052 04/25/17 0545  Weight: 45.4 kg (100 lb) 55.6 kg (122 lb 9.2 oz)    Examination: General: No acute respiratory distress - altered  Lungs: Clear to auscultation bilaterally  Cardiovascular: tachycardic - irreg - no appreciable M or rub  Abdomen: Nontender, nondistended, soft, bowel sounds positive, no rebound Extremities: edema of L LE - wound covered in wound vac dressing L LE   CBC: Recent Labs  Lab 04/24/17 1045 04/25/17 0750  WBC 29.9* 32.1*  HGB 13.0 11.4*  HCT 39.6 33.6*  MCV 96.1 96.0  PLT 399 510   Basic Metabolic Panel: Recent Labs  Lab 04/24/17 1220 04/25/17 0750  NA 139 138  K 3.9 4.2  CL 107 97*  CO2 23 26  GLUCOSE 91 86  BUN 50* 57*  CREATININE 1.13* 1.23*  CALCIUM 5.2* 7.1*   GFR: Estimated Creatinine Clearance: 31.5 mL/min  (A) (by C-G formula based on SCr of 1.23 mg/dL (H)).  Liver Function Tests: Recent Labs  Lab 04/24/17 1220 04/25/17 0750  AST 26 28  ALT 9* 12*  ALKPHOS 134* 177*  BILITOT 0.6 0.9  PROT 3.4* 4.4*  ALBUMIN 1.2* 1.7*   Cardiac Enzymes: Recent Labs  Lab 04/24/17 1654  CKTOTAL 37*    HbA1C: Hgb A1c MFr Bld  Date/Time Value Ref Range Status  04/10/2017 07:10 PM 6.1 (H) 4.8 - 5.6 % Final    Comment:    (NOTE) Pre diabetes:          5.7%-6.4% Diabetes:              >6.4% Glycemic control for   <7.0% adults with diabetes     CBG: Recent Labs  Lab 04/24/17 2330 04/25/17 0429 04/25/17 0500 04/25/17 0756 04/25/17 1210  GLUCAP 84 64* 116* 102* 79    Recent Results (from the past 240 hour(s))  Urine culture     Status: None   Collection Time: 04/24/17  1:00 PM  Result Value Ref Range Status   Specimen Description URINE, RANDOM  Final   Special Requests NONE  Final   Culture NO GROWTH  Final   Report Status 04/25/2017 FINAL  Final  MRSA PCR Screening     Status: None   Collection Time: 04/24/17  6:39 PM  Result Value Ref Range Status   MRSA by PCR NEGATIVE NEGATIVE Final    Comment:        The GeneXpert MRSA Assay (FDA approved for NASAL specimens only), is one component of a comprehensive MRSA colonization surveillance program. It is not intended to diagnose MRSA infection nor to guide or monitor treatment for MRSA infections.   C difficile quick scan w PCR reflex     Status: None   Collection Time: 04/25/17  8:28 AM  Result Value Ref Range Status   C Diff antigen NEGATIVE NEGATIVE Final   C Diff toxin NEGATIVE NEGATIVE Final   C Diff interpretation No C. difficile detected.  Final     Scheduled Meds: . enoxaparin (LOVENOX) injection  40 mg Subcutaneous Q24H  . insulin aspart  0-9 Units Subcutaneous Q4H  . mometasone-formoterol  2 puff Inhalation BID    LOS: 1 day   Cherene Altes, MD [facility] Office  [phone] Pager - Text  Page per Amion as per below:  On-Call/Text Page:      [email or web address]      password [email or web address]  If 7PM-7AM, please contact night-coverage [email or web address] Password [email or web address] 04/25/2017, 2:02 PM

## 2017-04-25 NOTE — Consult Note (Signed)
Angel Estrada for Infectious Disease    Date of Admission:  04/24/2017   Total days of antibiotics:        11-28 ceftriaxone/flagyl        12-21 vanco/merrem               Reason for Consult: cellulitis    Referring Provider: Thereasa Solo   Assessment: Cellulitis RLE DM2 Protein Calorie Malnutrition, severe ARF  Plan: 1. Continue her broad anbx 2. Await her BCx 3. Continued wound care (plastics re-eval?) 4. Nutrition 5. Diabetic control is fairly good [phone])  Thank you so much for this interesting consult,  Principal Problem:   Sepsis affecting skin ([place]) Active Problems:   New onset atrial fibrillation ([place])   Diabetes mellitus without complication ([place])   Acute kidney injury ([place])   HTN (hypertension)   Pulmonary hypertension due to COPD ([place])   Acute urinary retention   Dementia (suspected)   Leg edema, left   . enoxaparin (LOVENOX) injection  40 mg Subcutaneous Q24H  . insulin aspart  0-9 Units Subcutaneous Q4H  . mometasone-formoterol  2 puff Inhalation BID    HPI: Angel Estrada is a 83 y.o. female with hx of DM2 (she denies) seen in hospital 11-21 to 11-23 for LLE wound with surrounding infection. She was d/c home with doxy. She states she originally had the wound 2 years ago.   She returned to hospital on 11-28 and was found to have insects in her wound as well as cat hair.  She was found to have necrotic tissue. She qualified as "sepsis" (WBC 18.6, lactate 3.92)) and was started on flagyl/vanco/aztreonam.  She was eval by WOC. She initially declined surgery but then agreed. She was seen by plastics and on 12-5 underwent  I & D, acell. She also had a wound VAC placed.  She was d/c home on 12-8 on ceftriaxone/flagyl with plan for 6 weeks.   She returns on 12-21 with mental status change, hypotension and acidosis. She was afebrile. Her WBC was 29.9.  She was changed to vanco/cefepime, her PIC was changed.  She was given stress dose steroids.    Her DNR orders were continued.    Review of Systems: Review of Systems  Unable to perform ROS: Mental acuity  she does not answer questions clearly- "no one's been talking to me". States that she was getting her anbx at home.   Past Medical History:  Diagnosis Date  . CHF (congestive heart failure) ([place])   . COPD (chronic obstructive pulmonary disease) ([place])   . Depression   . Diabetes mellitus without complication ([place])   . Hypertension   . Osteoporosis     Social History   Tobacco Use  . Smoking status: Never Smoker  . Smokeless tobacco: Never Used  Substance Use Topics  . Alcohol use: No  . Drug use: No    Family History  Problem Relation Age of Onset  . Diabetes type II Mother   . Hypertension Mother   . Brain cancer Father   . Hypertension Father   . Diabetes type II Brother   . Stroke Other      Medications:  Scheduled: . enoxaparin (LOVENOX) injection  40 mg Subcutaneous Q24H  . insulin aspart  0-9 Units Subcutaneous Q4H  . mometasone-formoterol  2 puff Inhalation BID    Abtx:  Anti-infectives (From admission, onward)   Start     Dose/Rate Route Frequency Ordered Stop  04/25/17 1500  vancomycin (VANCOCIN) IVPB 750 mg/150 ml premix     750 mg 150 mL/hr over 60 Minutes Intravenous Every 24 hours 04/24/17 1404     04/24/17 1400  meropenem (MERREM) 1 g in sodium chloride 0.9 % 100 mL IVPB     1 g 200 mL/hr over 30 Minutes Intravenous Every 12 hours 04/24/17 1340     04/24/17 1400  vancomycin (VANCOCIN) IVPB 1000 mg/200 mL premix     1,000 mg 200 mL/hr over 60 Minutes Intravenous  Once 04/24/17 1340 04/24/17 1602        OBJECTIVE: Blood pressure 93/64, pulse (!) 110, temperature 98.1 F (36.7 C), temperature source Oral, resp. rate 19, height _0  (1.651 m), weight 55.6 kg (122 lb 9.2 oz), SpO2 100 %.  Physical Exam  Constitutional: She appears lethargic and malnourished. She appears to not be writhing in pain. She appears distressed.  HENT:   Mouth/Throat: No oropharyngeal exudate.  Eyes: EOM are normal.  Neck: Neck supple.  Cardiovascular: Tachycardia present.  Pulmonary/Chest: Effort normal and breath sounds normal.  Abdominal: Soft. Bowel sounds are normal. There is no tenderness. There is no rebound.  Musculoskeletal: She exhibits no edema.       Arms:      Legs: Lymphadenopathy:    She has no cervical adenopathy.  Neurological: She appears lethargic.  Skin: She is not diaphoretic.  Psychiatric: She exhibits disordered thought content.    Lab Results Results for orders placed or performed during the hospital encounter of 04/24/17 (from the past 48 hour(s))  CBC     Status: Abnormal   Collection Time: 04/24/17 10:45 AM  Result Value Ref Range   WBC 29.9 (H) 4.0 - 10.5 K/uL   RBC 4.12 3.87 - 5.11 MIL/uL   Hemoglobin 13.0 12.0 - 15.0 g/dL   HCT 39.6 36.0 - 46.0 %   MCV 96.1 78.0 - 100.0 fL   MCH 31.6 26.0 - 34.0 pg   MCHC 32.8 30.0 - 36.0 g/dL   RDW 17.4 (H) 11.5 - 15.5 %   Platelets 399 150 - 400 K/uL  CBG monitoring, ED     Status: Abnormal   Collection Time: 04/24/17 11:08 AM  Result Value Ref Range   Glucose-Capillary 103 (H) 65 - 99 mg/dL  I-Stat CG4 Lactic Acid, ED     Status: Abnormal   Collection Time: 04/24/17 11:15 AM  Result Value Ref Range   Lactic Acid, Venous 4.98 (HH) 0.5 - 1.9 mmol/L   Comment NOTIFIED PHYSICIAN   POC occult blood, ED RN will collect     Status: Abnormal   Collection Time: 04/24/17 11:34 AM  Result Value Ref Range   Fecal Occult Bld POSITIVE (A) NEGATIVE  Culture, blood (routine x 2)     Status: None (Preliminary result)   Collection Time: 04/24/17 12:20 PM  Result Value Ref Range   Specimen Description BLOOD RIGHT FOREARM    Special Requests IN PEDIATRIC BOTTLE Blood Culture adequate volume    Culture NO GROWTH 1 DAY    Report Status PENDING   Comprehensive metabolic panel     Status: Abnormal   Collection Time: 04/24/17 12:20 PM  Result Value Ref Range   Sodium 139  135 - 145 mmol/L   Potassium 3.9 3.5 - 5.1 mmol/L   Chloride 107 101 - 111 mmol/L   CO2 23 22 - 32 mmol/L   Glucose, Bld 91 65 - 99 mg/dL   BUN 50 (H) 6 - 20  mg/dL   Creatinine, Ser 1.13 (H) 0.44 - 1.00 mg/dL   Calcium 5.2 (LL) 8.9 - 10.3 mg/dL    Comment: CRITICAL RESULT CALLED TO, READ BACK BY AND VERIFIED WITH: J.ALLEN,RN 04/24/17 1322 BY BSLADE    Total Protein 3.4 (L) 6.5 - 8.1 g/dL   Albumin 1.2 (L) 3.5 - 5.0 g/dL   AST 26 15 - 41 U/L   ALT 9 (L) 14 - 54 U/L   Alkaline Phosphatase 134 (H) 38 - 126 U/L   Total Bilirubin 0.6 0.3 - 1.2 mg/dL   GFR calc non Af Amer 44 (L) >60 mL/min   GFR calc Af Amer 51 (L) >60 mL/min    Comment: (NOTE) The eGFR has been calculated using the CKD EPI equation. This calculation has not been validated in all clinical situations. eGFR's persistently <60 mL/min signify possible Chronic Kidney Disease.    Anion gap 9 5 - 15  Urine culture     Status: None   Collection Time: 04/24/17  1:00 PM  Result Value Ref Range   Specimen Description URINE, RANDOM    Special Requests NONE    Culture NO GROWTH    Report Status 04/25/2017 FINAL   Urinalysis, Routine w reflex microscopic     Status: Abnormal   Collection Time: 04/24/17  1:00 PM  Result Value Ref Range   Color, Urine YELLOW YELLOW   APPearance CLEAR CLEAR   Specific Gravity, Urine 1.009 1.005 - 1.030   pH 5.0 5.0 - 8.0   Glucose, UA NEGATIVE NEGATIVE mg/dL   Hgb urine dipstick SMALL (A) NEGATIVE   Bilirubin Urine NEGATIVE NEGATIVE   Ketones, ur NEGATIVE NEGATIVE mg/dL   Protein, ur NEGATIVE NEGATIVE mg/dL   Nitrite NEGATIVE NEGATIVE   Leukocytes, UA NEGATIVE NEGATIVE   RBC / HPF 0-5 0 - 5 RBC/hpf   WBC, UA 0-5 0 - 5 WBC/hpf   Bacteria, UA NONE SEEN NONE SEEN   Squamous Epithelial / LPF NONE SEEN NONE SEEN  Culture, blood (routine x 2)     Status: None (Preliminary result)   Collection Time: 04/24/17  1:25 PM  Result Value Ref Range   Specimen Description BLOOD PICC LINE    Special  Requests      BOTTLES DRAWN AEROBIC AND ANAEROBIC Blood Culture adequate volume   Culture NO GROWTH 1 DAY    Report Status PENDING   I-Stat CG4 Lactic Acid, ED     Status: Abnormal   Collection Time: 04/24/17  2:37 PM  Result Value Ref Range   Lactic Acid, Venous 2.84 (HH) 0.5 - 1.9 mmol/L   Comment NOTIFIED PHYSICIAN   Cortisol     Status: None   Collection Time: 04/24/17  4:54 PM  Result Value Ref Range   Cortisol, Plasma 85.8 ug/dL    Comment: RESULTS CONFIRMED BY MANUAL DILUTION  Procalcitonin     Status: None   Collection Time: 04/24/17  4:54 PM  Result Value Ref Range   Procalcitonin 0.64 ng/mL    Comment:        Interpretation: PCT > 0.5 ng/mL and <= 2 ng/mL: Systemic infection (sepsis) is possible, but other conditions are known to elevate PCT as well. (NOTE)       Sepsis PCT Algorithm           Lower Respiratory Tract  Infection PCT Algorithm    ----------------------------     ----------------------------         PCT < 0.25 ng/mL                PCT < 0.10 ng/mL         Strongly encourage             Strongly discourage   discontinuation of antibiotics    initiation of antibiotics    ----------------------------     -----------------------------       PCT 0.25 - 0.50 ng/mL            PCT 0.10 - 0.25 ng/mL               OR       >80% decrease in PCT            Discourage initiation of                                            antibiotics      Encourage discontinuation           of antibiotics    ----------------------------     -----------------------------         PCT >= 0.50 ng/mL              PCT 0.26 - 0.50 ng/mL                AND       <80% decrease in PCT             Encourage initiation of                                             antibiotics       Encourage continuation           of antibiotics    ----------------------------     -----------------------------        PCT >= 0.50 ng/mL                  PCT > 0.50  ng/mL               AND         increase in PCT                  Strongly encourage                                      initiation of antibiotics    Strongly encourage escalation           of antibiotics                                     -----------------------------                                           PCT <= 0.25 ng/mL  OR                                        > 80% decrease in PCT                                     Discontinue / Do not initiate                                             antibiotics   C-reactive protein     Status: Abnormal   Collection Time: 04/24/17  4:54 PM  Result Value Ref Range   CRP 5.7 (H) <1.0 mg/dL  CK     Status: Abnormal   Collection Time: 04/24/17  4:54 PM  Result Value Ref Range   Total CK 37 (L) 38 - 234 U/L  CBG monitoring, ED     Status: Abnormal   Collection Time: 04/24/17  4:58 PM  Result Value Ref Range   Glucose-Capillary 102 (H) 65 - 99 mg/dL  Lactic acid, plasma     Status: Abnormal   Collection Time: 04/24/17  5:09 PM  Result Value Ref Range   Lactic Acid, Venous 3.3 (HH) 0.5 - 1.9 mmol/L    Comment: CRITICAL RESULT CALLED TO, READ BACK BY AND VERIFIED WITH: K PACE,RN 1813 04/24/17 D BRADLEY   MRSA PCR Screening     Status: None   Collection Time: 04/24/17  6:39 PM  Result Value Ref Range   MRSA by PCR NEGATIVE NEGATIVE    Comment:        The GeneXpert MRSA Assay (FDA approved for NASAL specimens only), is one component of a comprehensive MRSA colonization surveillance program. It is not intended to diagnose MRSA infection nor to guide or monitor treatment for MRSA infections.   Glucose, capillary     Status: None   Collection Time: 04/24/17  8:22 PM  Result Value Ref Range   Glucose-Capillary 92 65 - 99 mg/dL  Lactic acid, plasma     Status: Abnormal   Collection Time: 04/24/17  9:09 PM  Result Value Ref Range   Lactic Acid, Venous 2.2 (HH) 0.5 - 1.9 mmol/L     Comment: CRITICAL RESULT CALLED TO, READ BACK BY AND VERIFIED WITH: POTEAT A,RN 04/24/17 2203 WAYK   Glucose, capillary     Status: None   Collection Time: 04/24/17 11:30 PM  Result Value Ref Range   Glucose-Capillary 84 65 - 99 mg/dL  Glucose, capillary     Status: Abnormal   Collection Time: 04/25/17  4:29 AM  Result Value Ref Range   Glucose-Capillary 64 (L) 65 - 99 mg/dL  Glucose, capillary     Status: Abnormal   Collection Time: 04/25/17  5:00 AM  Result Value Ref Range   Glucose-Capillary 116 (H) 65 - 99 mg/dL  Comprehensive metabolic panel     Status: Abnormal   Collection Time: 04/25/17  7:50 AM  Result Value Ref Range   Sodium 138 135 - 145 mmol/L   Potassium 4.2 3.5 - 5.1 mmol/L   Chloride 97 (L) 101 - 111 mmol/L   CO2 26 22 - 32 mmol/L   Glucose, Bld 86 65 - 99 mg/dL  BUN 57 (H) 6 - 20 mg/dL   Creatinine, Ser 1.23 (H) 0.44 - 1.00 mg/dL   Calcium 7.1 (L) 8.9 - 10.3 mg/dL   Total Protein 4.4 (L) 6.5 - 8.1 g/dL   Albumin 1.7 (L) 3.5 - 5.0 g/dL   AST 28 15 - 41 U/L   ALT 12 (L) 14 - 54 U/L   Alkaline Phosphatase 177 (H) 38 - 126 U/L   Total Bilirubin 0.9 0.3 - 1.2 mg/dL   GFR calc non Af Amer 40 (L) >60 mL/min   GFR calc Af Amer 46 (L) >60 mL/min    Comment: (NOTE) The eGFR has been calculated using the CKD EPI equation. This calculation has not been validated in all clinical situations. eGFR's persistently <60 mL/min signify possible Chronic Kidney Disease.    Anion gap 15 5 - 15  CBC     Status: Abnormal   Collection Time: 04/25/17  7:50 AM  Result Value Ref Range   WBC 32.1 (H) 4.0 - 10.5 K/uL    Comment: CONSISTENT WITH PREVIOUS RESULT   RBC 3.50 (L) 3.87 - 5.11 MIL/uL   Hemoglobin 11.4 (L) 12.0 - 15.0 g/dL   HCT 33.6 (L) 36.0 - 46.0 %   MCV 96.0 78.0 - 100.0 fL   MCH 32.6 26.0 - 34.0 pg   MCHC 33.9 30.0 - 36.0 g/dL   RDW 18.2 (H) 11.5 - 15.5 %   Platelets 349 150 - 400 K/uL  Glucose, capillary     Status: Abnormal   Collection Time: 04/25/17  7:56  AM  Result Value Ref Range   Glucose-Capillary 102 (H) 65 - 99 mg/dL  Urinalysis, Routine w reflex microscopic     Status: Abnormal   Collection Time: 04/25/17  8:20 AM  Result Value Ref Range   Color, Urine AMBER (A) YELLOW    Comment: BIOCHEMICALS MAY BE AFFECTED BY COLOR   APPearance HAZY (A) CLEAR   Specific Gravity, Urine 1.016 1.005 - 1.030   pH 5.0 5.0 - 8.0   Glucose, UA NEGATIVE NEGATIVE mg/dL   Hgb urine dipstick LARGE (A) NEGATIVE   Bilirubin Urine NEGATIVE NEGATIVE   Ketones, ur NEGATIVE NEGATIVE mg/dL   Protein, ur NEGATIVE NEGATIVE mg/dL   Nitrite NEGATIVE NEGATIVE   Leukocytes, UA TRACE (A) NEGATIVE   RBC / HPF TOO NUMEROUS TO COUNT 0 - 5 RBC/hpf   WBC, UA 6-30 0 - 5 WBC/hpf   Bacteria, UA RARE (A) NONE SEEN   Squamous Epithelial / LPF NONE SEEN NONE SEEN   Mucus PRESENT    Hyaline Casts, UA PRESENT   C difficile quick scan w PCR reflex     Status: None   Collection Time: 04/25/17  8:28 AM  Result Value Ref Range   C Diff antigen NEGATIVE NEGATIVE   C Diff toxin NEGATIVE NEGATIVE   C Diff interpretation No C. difficile detected.   Glucose, capillary     Status: None   Collection Time: 04/25/17 12:10 PM  Result Value Ref Range   Glucose-Capillary 79 65 - 99 mg/dL      Component Value Date/Time   SDES BLOOD PICC LINE 04/24/2017 1325   SPECREQUEST  04/24/2017 1325    BOTTLES DRAWN AEROBIC AND ANAEROBIC Blood Culture adequate volume   CULT NO GROWTH 1 DAY 04/24/2017 1325   REPTSTATUS PENDING 04/24/2017 1325   Dg Chest Portable 1 View  Result Date: 04/24/2017 CLINICAL DATA:  Altered mental status. EXAM: PORTABLE CHEST 1 VIEW COMPARISON:  Chest  x-ray 04/11/2017.  CT 04/08/2017 . FINDINGS: Right PICC line in stable position. Mediastinum hilar structures are normal. Heart size stable. Stable elevation left hemidiaphragm with stable left basilar atelectasis. Stable mild right base atelectasis. Thoracolumbar spine scoliosis. Air-filled scratched it air-containing  structures over the left upper abdomen most likely represent stomach and colon. reference is made to prior CT report of 04/08/2017 . IMPRESSION: 1. Right PICC line stable position. 2. Stable elevation left hemidiaphragm with stable atelectasis left base. Stable mild right base subsegmental atelectasis. Electronically Signed   By: Marcello Moores  Register   On: 04/24/2017 11:46   Recent Results (from the past 240 hour(s))  Culture, blood (routine x 2)     Status: None (Preliminary result)   Collection Time: 04/24/17 12:20 PM  Result Value Ref Range Status   Specimen Description BLOOD RIGHT FOREARM  Final   Special Requests IN PEDIATRIC BOTTLE Blood Culture adequate volume  Final   Culture NO GROWTH 1 DAY  Final   Report Status PENDING  Incomplete  Urine culture     Status: None   Collection Time: 04/24/17  1:00 PM  Result Value Ref Range Status   Specimen Description URINE, RANDOM  Final   Special Requests NONE  Final   Culture NO GROWTH  Final   Report Status 04/25/2017 FINAL  Final  Culture, blood (routine x 2)     Status: None (Preliminary result)   Collection Time: 04/24/17  1:25 PM  Result Value Ref Range Status   Specimen Description BLOOD PICC LINE  Final   Special Requests   Final    BOTTLES DRAWN AEROBIC AND ANAEROBIC Blood Culture adequate volume   Culture NO GROWTH 1 DAY  Final   Report Status PENDING  Incomplete  MRSA PCR Screening     Status: None   Collection Time: 04/24/17  6:39 PM  Result Value Ref Range Status   MRSA by PCR NEGATIVE NEGATIVE Final    Comment:        The GeneXpert MRSA Assay (FDA approved for NASAL specimens only), is one component of a comprehensive MRSA colonization surveillance program. It is not intended to diagnose MRSA infection nor to guide or monitor treatment for MRSA infections.   C difficile quick scan w PCR reflex     Status: None   Collection Time: 04/25/17  8:28 AM  Result Value Ref Range Status   C Diff antigen NEGATIVE NEGATIVE Final     C Diff toxin NEGATIVE NEGATIVE Final   C Diff interpretation No C. difficile detected.  Final    Microbiology: Recent Results (from the past 240 hour(s))  Culture, blood (routine x 2)     Status: None (Preliminary result)   Collection Time: 04/24/17 12:20 PM  Result Value Ref Range Status   Specimen Description BLOOD RIGHT FOREARM  Final   Special Requests IN PEDIATRIC BOTTLE Blood Culture adequate volume  Final   Culture NO GROWTH 1 DAY  Final   Report Status PENDING  Incomplete  Urine culture     Status: None   Collection Time: 04/24/17  1:00 PM  Result Value Ref Range Status   Specimen Description URINE, RANDOM  Final   Special Requests NONE  Final   Culture NO GROWTH  Final   Report Status 04/25/2017 FINAL  Final  Culture, blood (routine x 2)     Status: None (Preliminary result)   Collection Time: 04/24/17  1:25 PM  Result Value Ref Range Status   Specimen Description BLOOD  PICC LINE  Final   Special Requests   Final    BOTTLES DRAWN AEROBIC AND ANAEROBIC Blood Culture adequate volume   Culture NO GROWTH 1 DAY  Final   Report Status PENDING  Incomplete  MRSA PCR Screening     Status: None   Collection Time: 04/24/17  6:39 PM  Result Value Ref Range Status   MRSA by PCR NEGATIVE NEGATIVE Final    Comment:        The GeneXpert MRSA Assay (FDA approved for NASAL specimens only), is one component of a comprehensive MRSA colonization surveillance program. It is not intended to diagnose MRSA infection nor to guide or monitor treatment for MRSA infections.   C difficile quick scan w PCR reflex     Status: None   Collection Time: 04/25/17  8:28 AM  Result Value Ref Range Status   C Diff antigen NEGATIVE NEGATIVE Final   C Diff toxin NEGATIVE NEGATIVE Final   C Diff interpretation No C. difficile detected.  Final    Radiographs and labs were personally reviewed by me.   Bobby Rumpf, MD [facility] for Infectious [place]  Group [phone] 04/25/2017, 3:04 PM

## 2017-04-25 NOTE — Progress Notes (Signed)
Pt had a BP of 84/73. Temp was 97.22f NP on call was notified. 2550mbolus was ordered. Bolus given. Post bolus BP was 87/67,(MAP of 75). Temp was 97.50f16fP notified of values. NP called RN back and advised to continue to monitor pt. No new orders. Will continue to monitor.

## 2017-04-25 NOTE — Progress Notes (Signed)
Left lower extremity venous duplex completed. Positive for DVT coursing from the proximal posterior tibial vein through the popliteal, and femoral veins. Unable to adequately visualize the greater saphenous vein due to swelling. [facility], RVS 04/25/2017,2:53 PM

## 2017-04-26 ENCOUNTER — Other Ambulatory Visit (HOSPITAL_COMMUNITY): Payer: Medicare Other

## 2017-04-26 DIAGNOSIS — I4891 Unspecified atrial fibrillation: Secondary | ICD-10-CM

## 2017-04-26 DIAGNOSIS — I5032 Chronic diastolic (congestive) heart failure: Secondary | ICD-10-CM

## 2017-04-26 DIAGNOSIS — I272 Pulmonary hypertension, unspecified: Secondary | ICD-10-CM

## 2017-04-26 DIAGNOSIS — N179 Acute kidney failure, unspecified: Secondary | ICD-10-CM

## 2017-04-26 DIAGNOSIS — E118 Type 2 diabetes mellitus with unspecified complications: Secondary | ICD-10-CM

## 2017-04-26 DIAGNOSIS — I9589 Other hypotension: Secondary | ICD-10-CM

## 2017-04-26 DIAGNOSIS — J431 Panlobular emphysema: Secondary | ICD-10-CM

## 2017-04-26 DIAGNOSIS — I824Z2 Acute embolism and thrombosis of unspecified deep veins of left distal lower extremity: Secondary | ICD-10-CM

## 2017-04-26 DIAGNOSIS — L03116 Cellulitis of left lower limb: Secondary | ICD-10-CM

## 2017-04-26 DIAGNOSIS — E861 Hypovolemia: Secondary | ICD-10-CM

## 2017-04-26 DIAGNOSIS — R6520 Severe sepsis without septic shock: Secondary | ICD-10-CM

## 2017-04-26 LAB — BLOOD CULTURE ID PANEL (REFLEXED)
ACINETOBACTER BAUMANNII: NOT DETECTED
CANDIDA ALBICANS: NOT DETECTED
Candida glabrata: NOT DETECTED
Candida krusei: NOT DETECTED
Candida parapsilosis: NOT DETECTED
Candida tropicalis: NOT DETECTED
ENTEROBACTERIACEAE SPECIES: NOT DETECTED
ENTEROCOCCUS SPECIES: NOT DETECTED
Enterobacter cloacae complex: NOT DETECTED
Escherichia coli: NOT DETECTED
HAEMOPHILUS INFLUENZAE: NOT DETECTED
Klebsiella oxytoca: NOT DETECTED
Klebsiella pneumoniae: NOT DETECTED
LISTERIA MONOCYTOGENES: NOT DETECTED
METHICILLIN RESISTANCE: DETECTED — AB
Neisseria meningitidis: NOT DETECTED
PSEUDOMONAS AERUGINOSA: NOT DETECTED
Proteus species: NOT DETECTED
SERRATIA MARCESCENS: NOT DETECTED
STAPHYLOCOCCUS AUREUS BCID: NOT DETECTED
STREPTOCOCCUS AGALACTIAE: NOT DETECTED
STREPTOCOCCUS PNEUMONIAE: NOT DETECTED
STREPTOCOCCUS PYOGENES: NOT DETECTED
STREPTOCOCCUS SPECIES: NOT DETECTED
Staphylococcus species: DETECTED — AB

## 2017-04-26 LAB — GLUCOSE, CAPILLARY
GLUCOSE-CAPILLARY: 125 mg/dL — AB (ref 65–99)
GLUCOSE-CAPILLARY: 126 mg/dL — AB (ref 65–99)
Glucose-Capillary: 60 mg/dL — ABNORMAL LOW (ref 65–99)
Glucose-Capillary: 56 mg/dL — ABNORMAL LOW (ref 65–99)
Glucose-Capillary: 71 mg/dL (ref 65–99)
Glucose-Capillary: 110 mg/dL — ABNORMAL HIGH (ref 65–99)
Glucose-Capillary: 124 mg/dL — ABNORMAL HIGH (ref 65–99)

## 2017-04-26 LAB — COMPREHENSIVE METABOLIC PANEL
ALK PHOS: 170 U/L — AB (ref 38–126)
ALT: 12 U/L — AB (ref 14–54)
ANION GAP: 8 (ref 5–15)
AST: 23 U/L (ref 15–41)
Albumin: 1.6 g/dL — ABNORMAL LOW (ref 3.5–5.0)
BUN: 52 mg/dL — ABNORMAL HIGH (ref 6–20)
CALCIUM: 7.6 mg/dL — AB (ref 8.9–10.3)
CHLORIDE: 101 mmol/L (ref 101–111)
CO2: 32 mmol/L (ref 22–32)
CREATININE: 1.01 mg/dL — AB (ref 0.44–1.00)
GFR, EST AFRICAN AMERICAN: 59 mL/min — AB (ref >60)
GFR, EST NON AFRICAN AMERICAN: 51 mL/min — AB (ref >60)
Glucose, Bld: 65 mg/dL (ref 65–99)
Potassium: 3.6 mmol/L (ref 3.5–5.1)
SODIUM: 141 mmol/L (ref 135–145)
Total Bilirubin: 0.9 mg/dL (ref 0.3–1.2)
Total Protein: 4.5 g/dL — ABNORMAL LOW (ref 6.5–8.1)

## 2017-04-26 LAB — CBC
HEMATOCRIT: 35.1 % — AB (ref 36.0–46.0)
Hemoglobin: 11.4 g/dL — ABNORMAL LOW (ref 12.0–15.0)
MCH: 31.6 pg (ref 26.0–34.0)
MCHC: 32.5 g/dL (ref 30.0–36.0)
MCV: 97.2 fL (ref 78.0–100.0)
PLATELETS: 356 10*3/uL (ref 150–400)
RBC: 3.61 MIL/uL — ABNORMAL LOW (ref 3.87–5.11)
RDW: 18.1 % — AB (ref 11.5–15.5)
WBC: 24.4 10*3/uL — AB (ref 4.0–10.5)

## 2017-04-26 LAB — FOLATE: Folate: 20.7 ng/mL (ref >5.9)

## 2017-04-26 LAB — LACTIC ACID, PLASMA
LACTIC ACID, VENOUS: 2 mmol/L — AB (ref 0.5–1.9)
LACTIC ACID, VENOUS: 1.8 mmol/L (ref 0.5–1.9)

## 2017-04-26 LAB — RETICULOCYTES
RBC.: 2.6 MIL/uL — AB (ref 3.87–5.11)
RETIC COUNT ABSOLUTE: 46.8 10*3/uL (ref 19.0–186.0)
RETIC CT PCT: 1.8 % (ref 0.4–3.1)

## 2017-04-26 LAB — HEPARIN LEVEL (UNFRACTIONATED)
HEPARIN UNFRACTIONATED: 0.65 [IU]/mL (ref 0.30–0.70)
Heparin Unfractionated: 0.55 IU/mL (ref 0.30–0.70)

## 2017-04-26 LAB — MAGNESIUM: MAGNESIUM: 2.1 mg/dL (ref 1.7–2.4)

## 2017-04-26 MED ORDER — SODIUM CHLORIDE 0.9% FLUSH
10.0000 mL | INTRAVENOUS | Status: DC | PRN
  Administered 2017-04-28 – 2017-04-29 (×3): 10 mL
  Filled 2017-04-26 (×3): qty 40

## 2017-04-26 MED ORDER — IPRATROPIUM-ALBUTEROL 0.5-2.5 (3) MG/3ML IN SOLN
3.0000 mL | Freq: Three times a day (TID) | RESPIRATORY_TRACT | Status: DC
  Administered 2017-04-26: 3 mL via RESPIRATORY_TRACT
  Filled 2017-04-26: qty 3

## 2017-04-26 MED ORDER — ALBUMIN HUMAN 25 % IV SOLN
75.0000 g | Freq: Once | INTRAVENOUS | Status: AC
  Administered 2017-04-26: 75 g via INTRAVENOUS
  Filled 2017-04-26: qty 300

## 2017-04-26 MED ORDER — SODIUM CHLORIDE 0.9 % IV SOLN
INTRAVENOUS | Status: DC
  Administered 2017-04-26: 14:00:00 via INTRAVENOUS
  Administered 2017-04-27: 100 mL/h via INTRAVENOUS
  Administered 2017-04-27 – 2017-04-28 (×3): via INTRAVENOUS
  Administered 2017-04-29: 1000 mL via INTRAVENOUS

## 2017-04-26 MED ORDER — DEXTROSE 50 % IV SOLN
INTRAVENOUS | Status: AC
  Filled 2017-04-26: qty 50

## 2017-04-26 NOTE — Progress Notes (Signed)
Pt has heparin going through single lumen PICC. IV team consulted to place another IV access in order to give pt the ordered albumin. Waiting on IV team. Will continue to assess.

## 2017-04-26 NOTE — Progress Notes (Signed)
[place] for Heparin Indication: DVT  Allergies  Allergen Reactions  . Penicillins Swelling and Rash    Site of swelling (?) Has patient had a PCN reaction causing immediate rash, facial/tongue/throat swelling, SOB or lightheadedness with hypotension: Yes Has patient had a PCN reaction causing severe rash involving mucus membranes or skin necrosis: No Has patient had a PCN reaction that required hospitalization: No Has patient had a PCN reaction occurring within the last 10 years: No If all of the above answers are "NO", then may proceed with Cephalosporin use.   Marland Kitchen Lisinopril-Hydrochlorothiazide Other (See Comments) and Cough    Pt's PCP took her off medication because of possible kidney damage  . Sulfa Antibiotics Other (See Comments)    Unknown reaction-- childhood allergy   . Latex Rash    Patient Measurements: Height: _0  (165.1 cm) Weight: 122 lb 9.2 oz (55.6 kg) IBW/kg (Calculated) : 57 Heparin Dosing Weight: 45kg  Vital Signs: Temp: 97.4 F (36.3 C) (12/23 0432) Temp Source: Oral (12/23 0432) BP: 94/67 (12/23 0432) Pulse Rate: 68 (12/23 0432)  Labs: Recent Labs    04/24/17 1045 04/24/17 1220 04/24/17 1654 04/25/17 0750 04/26/17 0342  HGB 13.0  --   --  11.4* 11.4*  HCT 39.6  --   --  33.6* 35.1*  PLT 399  --   --  349 356  HEPARINUNFRC  --   --   --   --  0.65  CREATININE  --  1.13*  --  1.23* 1.01*  CKTOTAL  --   --  37*  --   --     Estimated Creatinine Clearance: 38.3 mL/min (A) (by C-G formula based on SCr of 1.01 mg/dL (H)).  Assessment: 24 yof to start IV heparin for acute DVT. Of note, pt with FOB+ so will not bolus. H/H slightly low and platelets are WNL. Pt received prophylactic lovenox dose just prior to starting IV heparin.   12/23 AM: initial heparin level is therapeutic   Goal of Therapy:  Heparin level 0.3-0.7 units/ml Monitor platelets by anticoagulation protocol: Yes   Plan:  Cont heparin  drip 800 units/hr 1200 confirmatory heparin level  Narda Bonds 04/26/2017,5:42 AM

## 2017-04-26 NOTE — Progress Notes (Signed)
   04/26/17 1500  Clinical Encounter Type  Visited With Patient  Visit Type Initial  Referral From Nurse  Consult/Referral To Chaplain  Spiritual Encounters  Spiritual Needs Emotional   Responded to a SCC.  Patient was alone.  Asked if she had family and she said not, but a house mate.  She is very appreciative of the care her house mate has provided.  They have known each other a long time.  Patient seemed concerned about her leg and how much longer she will be in the hospital.  Tried to offer a safe space for her to share anything, but did not say much.  Will follow as needed. Chaplain Katherene Ponto

## 2017-04-26 NOTE — Progress Notes (Signed)
ANTICOAGULATION CONSULT NOTE   Pharmacy Consult for Heparin Indication: DVT  Patient Measurements: Height: _0  (165.1 cm) Weight: 130 lb 8.2 oz (59.2 kg) IBW/kg (Calculated) : 57 Heparin Dosing Weight: 45kg  Labs: Recent Labs    04/24/17 1045 04/24/17 1220 04/24/17 1654 04/25/17 0750 04/26/17 0342 04/26/17 1216  HGB 13.0  --   --  11.4* 11.4*  --   HCT 39.6  --   --  33.6* 35.1*  --   PLT 399  --   --  349 356  --   HEPARINUNFRC  --   --   --   --  0.65 0.55  CREATININE  --  1.13*  --  1.23* 1.01*  --   CKTOTAL  --   --  37*  --   --   --    Assessment: 56 yof to start IV heparin for acute DVT. Of note, pt with FOB+ so will not bolus. H/H slightly low and platelets are WNL. Pt received prophylactic lovenox dose just prior to starting IV heparin.   Both the initial and confirmatory levels are therapeutic. Hgb and PLTs are wnl.   Goal of Therapy:  Heparin level 0.3-0.7 units/ml Monitor platelets by anticoagulation protocol: Yes   Plan:  Cont heparin drip 800 units/hr Daily heparin levels Monitor for bleeding   Patterson Hammersmith PharmD PGY1 Pharmacy Practice Resident 04/26/2017 1:36 PM Pager: [phone] Phone: [phone]

## 2017-04-26 NOTE — Consult Note (Signed)
12/23 WOC consult requested for Vac dressing.  Plastic surgery is following this patient for assessment and plan of care; please refer to their team for further questions.  Thank-you, Julien Girt, [place] nurse

## 2017-04-26 NOTE — Progress Notes (Signed)
Hypoglycemic Event  CBG: 60  Treatment: 15 GM carbohydrate snack  Symptoms: None  Follow-up CBG: Time:452 CBG Result:71  Possible Reasons for Event: Inadequate meal intake  Comments/MD notified:Bodenheimer, NP    Angel Estrada T Olesya Wike

## 2017-04-26 NOTE — Progress Notes (Signed)
Pt's HR keep fluctuating between the 110's to the 140's. MD Sherral Hammers made aware. Will continue to assess.

## 2017-04-26 NOTE — Progress Notes (Signed)
INFECTIOUS DISEASE PROGRESS NOTE  ID: Angel Estrada is a 83 y.o. female with  Principal Problem:   Sepsis affecting skin ([place]) Active Problems:   New onset atrial fibrillation ([place])   Diabetes mellitus without complication ([place])   Acute kidney injury ([place])   HTN (hypertension)   Pulmonary hypertension due to COPD ([place])   Acute urinary retention   Dementia (suspected)   Leg edema, left  Subjective: No complaints.   Abtx:  Anti-infectives (From admission, onward)   Start     Dose/Rate Route Frequency Ordered Stop   04/25/17 1500  vancomycin (VANCOCIN) IVPB 750 mg/150 ml premix     750 mg 150 mL/hr over 60 Minutes Intravenous Every 24 hours 04/24/17 1404     04/24/17 1400  meropenem (MERREM) 1 g in sodium chloride 0.9 % 100 mL IVPB     1 g 200 mL/hr over 30 Minutes Intravenous Every 12 hours 04/24/17 1340     04/24/17 1400  vancomycin (VANCOCIN) IVPB 1000 mg/200 mL premix     1,000 mg 200 mL/hr over 60 Minutes Intravenous  Once 04/24/17 1340 04/24/17 1602      Medications:  Scheduled: . insulin aspart  0-9 Units Subcutaneous Q4H  . mometasone-formoterol  2 puff Inhalation BID    Objective: Vital signs in last 24 hours: Temp:  [97.3 F (36.3 C)-98.3 F (36.8 C)] 97.3 F (36.3 C) (12/23 0759) Pulse Rate:  [48-115] 106 (12/23 0941) Resp:  [15-19] 16 (12/23 0941) BP: (83-94)/(61-73) 87/61 (12/23 0807) SpO2:  [95 %-100 %] 95 % (12/23 0941) Weight:  [59.2 kg (130 lb 8.2 oz)] 59.2 kg (130 lb 8.2 oz) (12/23 0500)   General appearance: fatigued Resp: clear to auscultation bilaterally Cardio: regularly irregular rhythm GI: normal findings: bowel sounds normal and soft, non-tender Extremities: LLE wrapped. no proximal erythema or heat. RUE PIC has no d/c. is non-tender.   Lab Results Recent Labs    04/25/17 0750 04/26/17 0342  WBC 32.1* 24.4*  HGB 11.4* 11.4*  HCT 33.6* 35.1*  NA 138 141  K 4.2 3.6  CL 97* 101  CO2 26 32  BUN 57* 52*  CREATININE 1.23*  1.01*   Liver Panel Recent Labs    04/25/17 0750 04/26/17 0342  PROT 4.4* 4.5*  ALBUMIN 1.7* 1.6*  AST 28 23  ALT 12* 12*  ALKPHOS 177* 170*  BILITOT 0.9 0.9   Sedimentation Rate No results for input(s): ESRSEDRATE in the last 72 hours. C-Reactive Protein Recent Labs    04/24/17 1654  CRP 5.7*    Microbiology: Recent Results (from the past 240 hour(s))  Culture, blood (routine x 2)     Status: None (Preliminary result)   Collection Time: 04/24/17 12:20 PM  Result Value Ref Range Status   Specimen Description BLOOD RIGHT FOREARM  Final   Special Requests IN PEDIATRIC BOTTLE Blood Culture adequate volume  Final   Culture NO GROWTH 1 DAY  Final   Report Status PENDING  Incomplete  Urine culture     Status: None   Collection Time: 04/24/17  1:00 PM  Result Value Ref Range Status   Specimen Description URINE, RANDOM  Final   Special Requests NONE  Final   Culture NO GROWTH  Final   Report Status 04/25/2017 FINAL  Final  Culture, blood (routine x 2)     Status: None (Preliminary result)   Collection Time: 04/24/17  1:25 PM  Result Value Ref Range Status   Specimen Description BLOOD PICC LINE  Final   Special Requests   Final    BOTTLES DRAWN AEROBIC AND ANAEROBIC Blood Culture adequate volume   Culture  Setup Time   Final    AEROBIC BOTTLE ONLY GRAM POSITIVE COCCI IN CLUSTERS CRITICAL RESULT CALLED TO, READ BACK BY AND VERIFIED WITH: JLEDFORD,PHARMD BY [facility] 04/26/17 AT 3:57AM    Culture GRAM POSITIVE COCCI TOO YOUNG TO READ   Final   Report Status PENDING  Incomplete  Blood Culture ID Panel (Reflexed)     Status: Abnormal   Collection Time: 04/24/17  1:25 PM  Result Value Ref Range Status   Enterococcus species NOT DETECTED NOT DETECTED Final   Listeria monocytogenes NOT DETECTED NOT DETECTED Final   Staphylococcus species DETECTED (A) NOT DETECTED Final    Comment: Methicillin (oxacillin) resistant coagulase negative staphylococcus. Possible blood culture  contaminant (unless isolated from more than one blood culture draw or clinical case suggests pathogenicity). No antibiotic treatment is indicated for blood  culture contaminants. CRITICAL RESULT CALLED TO, READ BACK BY AND VERIFIED WITH: TO JLEDFORD BY [facility] 04/26/17 AT 3:57AM    Staphylococcus aureus NOT DETECTED NOT DETECTED Final   Methicillin resistance DETECTED (A) NOT DETECTED Final    Comment: CRITICAL RESULT CALLED TO, READ BACK BY AND VERIFIED WITH: TO JLEDFORD BY [facility] 04/26/17 AT 3:57AM    Streptococcus species NOT DETECTED NOT DETECTED Final   Streptococcus agalactiae NOT DETECTED NOT DETECTED Final   Streptococcus pneumoniae NOT DETECTED NOT DETECTED Final   Streptococcus pyogenes NOT DETECTED NOT DETECTED Final   Acinetobacter baumannii NOT DETECTED NOT DETECTED Final   Enterobacteriaceae species NOT DETECTED NOT DETECTED Final   Enterobacter cloacae complex NOT DETECTED NOT DETECTED Final   Escherichia coli NOT DETECTED NOT DETECTED Final   Klebsiella oxytoca NOT DETECTED NOT DETECTED Final   Klebsiella pneumoniae NOT DETECTED NOT DETECTED Final   Proteus species NOT DETECTED NOT DETECTED Final   Serratia marcescens NOT DETECTED NOT DETECTED Final   Haemophilus influenzae NOT DETECTED NOT DETECTED Final   Neisseria meningitidis NOT DETECTED NOT DETECTED Final   Pseudomonas aeruginosa NOT DETECTED NOT DETECTED Final   Candida albicans NOT DETECTED NOT DETECTED Final   Candida glabrata NOT DETECTED NOT DETECTED Final   Candida krusei NOT DETECTED NOT DETECTED Final   Candida parapsilosis NOT DETECTED NOT DETECTED Final   Candida tropicalis NOT DETECTED NOT DETECTED Final  MRSA PCR Screening     Status: None   Collection Time: 04/24/17  6:39 PM  Result Value Ref Range Status   MRSA by PCR NEGATIVE NEGATIVE Final    Comment:        The GeneXpert MRSA Assay (FDA approved for NASAL specimens only), is one component of a comprehensive MRSA  colonization surveillance program. It is not intended to diagnose MRSA infection nor to guide or monitor treatment for MRSA infections.   C difficile quick scan w PCR reflex     Status: None   Collection Time: 04/25/17  8:28 AM  Result Value Ref Range Status   C Diff antigen NEGATIVE NEGATIVE Final   C Diff toxin NEGATIVE NEGATIVE Final   C Diff interpretation No C. difficile detected.  Final    Studies/Results: No results found.   Assessment/Plan: Cellulitis RLE DM2 Protein Calorie Malnutrition, severe ARF  C diff (-) WBC better (still elevated). afebrile 1/2 BCx positive for MRSE Could this be PIC line infection? Await 2nd BCx- if + or if any clinic al change, pull pic.    Total  days of antibiotics: 11-28 ceftriaxone/flagyl 12-21 12-21 vanco/merrem           Bobby Rumpf MD, FACP Infectious Diseases (pager) [phone] [email or web address] 04/26/2017, 11:54 AM  LOS: 2 days

## 2017-04-26 NOTE — Progress Notes (Signed)
PROGRESS NOTE    Angel Estrada  UKG:[id number] DOB: Jul 17, 1933 DOA: 04/24/2017 PCP: Jani Gravel, MD   Brief Narrative:  83yo F WF from [facility] SNF PMHx DVT   COPD with pulmonary hypertension, grade 1 diastolic dysfunction, dementia, and DM who was discharged to a SNF after an admission for a L LE cellulitis with sepsis, and returned to the ED this admit with AMS following several days of very poor intake.  During prior hospitalization the patient underwent surgical debridement and wound VAC placement.  ID was following during the hospitalization and recommended 6 weeks of IV Rocephin with Flagyl with last dose 05/14/17.  Patient was discharged with a PICC line in place in the right upper extremity.  Upon presentation this admit pt was found to have a blood pressure of 80/68, pulse 125, and new onset atrial fibrillation.  Labs revealed mild acute kidney injury, lactate of 4.98, white count 29,900.  Patient was noted to have acute urinary retention and a Foley catheter was placed with about 2 L returned.        Subjective: 12/23 extremely sleepy but arousable. Once aroused A/O 4. Negative CP, negative SOB. States a physician place the wound VAC on her LLE but she doesn't recall which one (to many physicians). Negative abdominal pain, negative N/V.     Assessment & Plan:   Principal Problem:   Sepsis affecting skin ([place]) Active Problems:   New onset atrial fibrillation ([facility])   Diabetes mellitus without complication ([place])   Acute kidney injury ([place])   HTN (hypertension)   Pulmonary hypertension due to COPD ([place])   Acute urinary retention   Dementia (suspected)   Leg edema, left  Severe Sepsis Unclear etiology -Upon admission meets criteria for sepsis HR> 90, RR> 20, WBC> 12. In addition patient positive lactic acidosis, hypotension. -Initially treated with fluids and stress dose steroids with minimal effect on BP, therefore discontinued. -Patient with continued hypotension and  leukocytosis. -LLE cellulitis most likely source of infection -ID has been consulted.   Per ID notes: PTA antibiotics ceftriaxone/Flagyl 11/28>>> 12/21  Continue current antibiotics per ID   Hypotension -Secondary to sepsis -Albumin 75 g; repeat on 12/24 if still significantly low - normal saline 160m/hr  -Patient DO NOT RESUSCITATE therefore cannot use pressors could start Midodrin  Chronic Diastolic CHF -Strict in and out -Daily weight -Echocardiogram pending -Transfused for hemoglobin<8  New-onset atrial fibrillation(CHA2DS2 VASc score is 4) -Continued A. fib RVR: Most likely secondary to severe sepsis -Hopefully with albumin and hydration will decrease patient's RVR. -Would consider one dose digoxin  HTN -See CHF  Pulmonary HTN -See CHF   LEFT LEG cellulitis (discharged on 176/2 -Complicated wound with insect infestation contaminated by CAT hair -Per discharge summary on 12/8 patient underwent [facility]bridement [facility], was to continue wound VAC and follow-up in one week : Was seen by ID and instructed to continue Rocephin/Flagyl for 6 weeks with end date 05/14/17 -See severe sepsis -Does not appear patient followed [facility]    -Forwarded photo to  Dr. DMarla Roeplastic surgery and discussed case. Recommendations: KY gel to area daily with Kerlix and Ace wrap   COPD -Dulera -DuoNeb TID    Extensive L LE DVT -Still awaiting official read. Contacted radiology on 12/24 if no official read available -Continue heparin drip per pharmacy. Fully anticoagulate. -If any signs or symptoms of respiratory distress consider PE and obtain CTA PE protocol  Acute Kidney Injury (baseline  Cr 0.73) -Monitor closely: Most likely secondary to sepsis/hypoperfusion Recent Labs  Lab 04/24/17 1220 04/25/17 0750 04/26/17 0342  CREATININE 1.13* 1.23* 1.01*  I-mproving with hydration   Acute urinary  retention -Maintain Foley   Anemia -Guaiac positive -No sign of large scale bleed -Anemia panel pending Suspected mild dementia    Diabetes type 2 controlled with complication -82/9 Hemoglobin A1c = 6.1 -Sensitive SSI        DVT prophylaxis: Heparin drip Code Status: DO NOT RESUSCITATE Family Communication: None Disposition Plan: TBD   Consultants:  Phone consult Plastic surgery  Procedures/Significant Events:  12/22 Doppler LEFT LLE: Results pending     I have personally reviewed and interpreted all radiology studies and my findings are as above.  VENTILATOR SETTINGS:    Cultures 12/21 blood 1/2 positive coag negative staph (most likely contaminant)   12/21 urine negative   Antimicrobials: Anti-infectives (From admission, onward)   Start     Stop   04/25/17 1500  vancomycin (VANCOCIN) IVPB 750 mg/150 ml premix         04/24/17 1400  meropenem (MERREM) 1 g in sodium chloride 0.9 % 100 mL IVPB         04/24/17 1400  vancomycin (VANCOCIN) IVPB 1000 mg/200 mL premix     04/24/17 1602       Devices    LINES / TUBES:      Continuous Infusions: . heparin 800 Units/hr (04/25/17 1846)  . meropenem (MERREM) IV Stopped (04/25/17 2211)  . vancomycin Stopped (04/25/17 1646)     Objective: Vitals:   04/26/17 0432 04/26/17 0500 04/26/17 0759 04/26/17 0807  BP: 94/67   (!) 87/61  Pulse: 68   (!) 49  Resp: 19   18  Temp: (!) 97.4 F (36.3 C)  (!) 97.3 F (36.3 C)   TempSrc: Oral  Oral   SpO2: 96%   96%  Weight:  130 lb 8.2 oz (59.2 kg)    Height:        Intake/Output Summary (Last 24 hours) at 04/26/2017 0919 Last data filed at 04/26/2017 0854 Gross per 24 hour  Intake 996.8 ml  Output 700 ml  Net 296.8 ml   Filed Weights   04/24/17 1052 04/25/17 0545 04/26/17 0500  Weight: 100 lb (45.4 kg) 122 lb 9.2 oz (55.6 kg) 130 lb 8.2 oz (59.2 kg)    Examination:  General: Sleepy, arousable, A/O 4 once aroused, No acute respiratory  distress Neck:  Negative scars, masses, torticollis, lymphadenopathy, JVD Lungs: Clear to auscultation bilaterally without wheezes or crackles Cardiovascular: Irregular irregular rhythm and rate, without murmur gallop or rub normal S1 and S2 Abdomen: negative abdominal pain, nondistended, positive soft, bowel sounds, no rebound, no ascites, no appreciable mass Extremities: LEFT lower extremity wound on shin negative discharge, does not appear infected (necrotic edges possibly?), Negative bowel odor. Covered and clean. DP/PT pulse barely palpable. RIGHT lower extremity DP/PT pulse one plus.  Psychiatric:  Patient sleepy not fully able to assess.  Central nervous system:  Cranial nerves II through XII intact, tongue/uvula midline, all extremities muscle strength 2-3/5, sensation intact throughout, negative dysarthria, negative expressive aphasia, negative receptive aphasia.  .     Data Reviewed: Care during the described time interval was provided by me .  I have reviewed this patient's available data, including medical history, events of note, physical examination, and all test results as part of my evaluation.   CBC: Recent Labs  Lab 04/24/17 1045 04/25/17 0750 04/26/17 0342  WBC 29.9* 32.1* 24.4*  HGB 13.0 11.4* 11.4*  HCT 39.6 33.6* 35.1*  MCV 96.1 96.0 97.2  PLT 399 349 712   Basic Metabolic Panel: Recent Labs  Lab 04/24/17 1220 04/25/17 0750 04/26/17 0342  NA 139 138 141  K 3.9 4.2 3.6  CL 107 97* 101  CO2 23 26 32  GLUCOSE 91 86 65  BUN 50* 57* 52*  CREATININE 1.13* 1.23* 1.01*  CALCIUM 5.2* 7.1* 7.6*   GFR: Estimated Creatinine Clearance: 39.3 mL/min (A) (by C-G formula based on SCr of 1.01 mg/dL (H)). Liver Function Tests: Recent Labs  Lab 04/24/17 1220 04/25/17 0750 04/26/17 0342  AST _0 ALT 9* 12* 12*  ALKPHOS 134* 177* 170*  BILITOT 0.6 0.9 0.9  PROT 3.4* 4.4* 4.5*  ALBUMIN 1.2* 1.7* 1.6*   No results for input(s): LIPASE, AMYLASE in the last  168 hours. No results for input(s): AMMONIA in the last 168 hours. Coagulation Profile: No results for input(s): INR, PROTIME in the last 168 hours. Cardiac Enzymes: Recent Labs  Lab 04/24/17 1654  CKTOTAL 37*   BNP (last 3 results) No results for input(s): PROBNP in the last 8760 hours. HbA1C: No results for input(s): HGBA1C in the last 72 hours. CBG: Recent Labs  Lab 04/25/17 2356 04/26/17 0414 04/26/17 0450 04/26/17 0452 04/26/17 0758  GLUCAP 81 60* 56* 71 110*   Lipid Profile: No results for input(s): CHOL, HDL, LDLCALC, TRIG, CHOLHDL, LDLDIRECT in the last 72 hours. Thyroid Function Tests: No results for input(s): TSH, T4TOTAL, FREET4, T3FREE, THYROIDAB in the last 72 hours. Anemia Panel: No results for input(s): VITAMINB12, FOLATE, FERRITIN, TIBC, IRON, RETICCTPCT in the last 72 hours. Urine analysis:    Component Value Date/Time   COLORURINE AMBER (A) 04/25/2017 0820   APPEARANCEUR HAZY (A) 04/25/2017 0820   LABSPEC 1.016 04/25/2017 0820   PHURINE 5.0 04/25/2017 0820   GLUCOSEU NEGATIVE 04/25/2017 0820   HGBUR LARGE (A) 04/25/2017 0820   BILIRUBINUR NEGATIVE 04/25/2017 0820   KETONESUR NEGATIVE 04/25/2017 0820   PROTEINUR NEGATIVE 04/25/2017 0820   NITRITE NEGATIVE 04/25/2017 0820   LEUKOCYTESUR TRACE (A) 04/25/2017 0820   Sepsis Labs: _1 (procalcitonin:4,lacticidven:4)  ) Recent Results (from the past 240 hour(s))  Culture, blood (routine x 2)     Status: None (Preliminary result)   Collection Time: 04/24/17 12:20 PM  Result Value Ref Range Status   Specimen Description BLOOD RIGHT FOREARM  Final   Special Requests IN PEDIATRIC BOTTLE Blood Culture adequate volume  Final   Culture NO GROWTH 1 DAY  Final   Report Status PENDING  Incomplete  Urine culture     Status: None   Collection Time: 04/24/17  1:00 PM  Result Value Ref Range Status   Specimen Description URINE, RANDOM  Final   Special Requests NONE  Final   Culture NO GROWTH  Final    Report Status 04/25/2017 FINAL  Final  Culture, blood (routine x 2)     Status: None (Preliminary result)   Collection Time: 04/24/17  1:25 PM  Result Value Ref Range Status   Specimen Description BLOOD PICC LINE  Final   Special Requests   Final    BOTTLES DRAWN AEROBIC AND ANAEROBIC Blood Culture adequate volume   Culture  Setup Time   Final    AEROBIC BOTTLE ONLY GRAM POSITIVE COCCI IN CLUSTERS CRITICAL RESULT CALLED TO, READ BACK BY AND VERIFIED WITH: JLEDFORD,PHARMD BY [facility] 04/26/17 AT 3:57AM    Culture GRAM POSITIVE  COCCI  Final   Report Status PENDING  Incomplete  Blood Culture ID Panel (Reflexed)     Status: Abnormal   Collection Time: 04/24/17  1:25 PM  Result Value Ref Range Status   Enterococcus species NOT DETECTED NOT DETECTED Final   Listeria monocytogenes NOT DETECTED NOT DETECTED Final   Staphylococcus species DETECTED (A) NOT DETECTED Final    Comment: Methicillin (oxacillin) resistant coagulase negative staphylococcus. Possible blood culture contaminant (unless isolated from more than one blood culture draw or clinical case suggests pathogenicity). No antibiotic treatment is indicated for blood  culture contaminants. CRITICAL RESULT CALLED TO, READ BACK BY AND VERIFIED WITH: TO JLEDFORD BY [facility] 04/26/17 AT 3:57AM    Staphylococcus aureus NOT DETECTED NOT DETECTED Final   Methicillin resistance DETECTED (A) NOT DETECTED Final    Comment: CRITICAL RESULT CALLED TO, READ BACK BY AND VERIFIED WITH: TO JLEDFORD BY [facility] 04/26/17 AT 3:57AM    Streptococcus species NOT DETECTED NOT DETECTED Final   Streptococcus agalactiae NOT DETECTED NOT DETECTED Final   Streptococcus pneumoniae NOT DETECTED NOT DETECTED Final   Streptococcus pyogenes NOT DETECTED NOT DETECTED Final   Acinetobacter baumannii NOT DETECTED NOT DETECTED Final   Enterobacteriaceae species NOT DETECTED NOT DETECTED Final   Enterobacter cloacae complex NOT DETECTED NOT DETECTED Final    Escherichia coli NOT DETECTED NOT DETECTED Final   Klebsiella oxytoca NOT DETECTED NOT DETECTED Final   Klebsiella pneumoniae NOT DETECTED NOT DETECTED Final   Proteus species NOT DETECTED NOT DETECTED Final   Serratia marcescens NOT DETECTED NOT DETECTED Final   Haemophilus influenzae NOT DETECTED NOT DETECTED Final   Neisseria meningitidis NOT DETECTED NOT DETECTED Final   Pseudomonas aeruginosa NOT DETECTED NOT DETECTED Final   Candida albicans NOT DETECTED NOT DETECTED Final   Candida glabrata NOT DETECTED NOT DETECTED Final   Candida krusei NOT DETECTED NOT DETECTED Final   Candida parapsilosis NOT DETECTED NOT DETECTED Final   Candida tropicalis NOT DETECTED NOT DETECTED Final  MRSA PCR Screening     Status: None   Collection Time: 04/24/17  6:39 PM  Result Value Ref Range Status   MRSA by PCR NEGATIVE NEGATIVE Final    Comment:        The GeneXpert MRSA Assay (FDA approved for NASAL specimens only), is one component of a comprehensive MRSA colonization surveillance program. It is not intended to diagnose MRSA infection nor to guide or monitor treatment for MRSA infections.   C difficile quick scan w PCR reflex     Status: None   Collection Time: 04/25/17  8:28 AM  Result Value Ref Range Status   C Diff antigen NEGATIVE NEGATIVE Final   C Diff toxin NEGATIVE NEGATIVE Final   C Diff interpretation No C. difficile detected.  Final         Radiology Studies: Dg Chest Portable 1 View  Result Date: 04/24/2017 CLINICAL DATA:  Altered mental status. EXAM: PORTABLE CHEST 1 VIEW COMPARISON:  Chest x-ray 04/11/2017.  CT 04/08/2017 . FINDINGS: Right PICC line in stable position. Mediastinum hilar structures are normal. Heart size stable. Stable elevation left hemidiaphragm with stable left basilar atelectasis. Stable mild right base atelectasis. Thoracolumbar spine scoliosis. Air-filled scratched it air-containing structures over the left upper abdomen most likely represent  stomach and colon. reference is made to prior CT report of 04/08/2017 . IMPRESSION: 1. Right PICC line stable position. 2. Stable elevation left hemidiaphragm with stable atelectasis left base. Stable mild right base subsegmental atelectasis. Electronically Signed  By: Sarah Ann   On: 04/24/2017 11:46        Scheduled Meds: . insulin aspart  0-9 Units Subcutaneous Q4H  . mometasone-formoterol  2 puff Inhalation BID   Continuous Infusions: . heparin 800 Units/hr (04/25/17 1846)  . meropenem (MERREM) IV Stopped (04/25/17 2211)  . vancomycin Stopped (04/25/17 1646)     LOS: 2 days    Time spent: 40 minutes    Deetya Drouillard, Geraldo Docker, MD [facility] Pager [phone]   If 7PM-7AM, please contact night-coverage [email or web address] Password [facility] 04/26/2017, 9:19 AM

## 2017-04-26 NOTE — Progress Notes (Signed)
Removed woundvac dressing to assess wound. Wet to dry dressing applied since there is not an order for dressing. MD Sherral Hammers made aware. Will continue to assess.

## 2017-04-26 NOTE — Progress Notes (Signed)
CRITICAL VALUE ALERT  Critical Value:  Lactic acid 2.0   Date & Time Notied:  04/26/17 @ 1130  Provider Notified: MD Sherral Hammers  Orders Received/Actions taken:

## 2017-04-26 NOTE — Progress Notes (Signed)
PHARMACY - PHYSICIAN COMMUNICATION CRITICAL VALUE ALERT - BLOOD CULTURE IDENTIFICATION (BCID)  Kaliya Shreiner is an 83 y.o. female who presented to [facility] on 04/24/2017 with a chief complaint of altered mental status  Assessment:  Source likely lower extremity cellulitis  Name of physician (or Provider) Contacted: Bodenheimer ([facility])  Current antibiotics: Vancomycin, Merrem  Changes to prescribed antibiotics recommended: No changes recommended   Results for orders placed or performed during the hospital encounter of 04/24/17  Blood Culture ID Panel (Reflexed) (Collected: 04/24/2017  1:25 PM)  Result Value Ref Range   Enterococcus species NOT DETECTED NOT DETECTED   Listeria monocytogenes NOT DETECTED NOT DETECTED   Staphylococcus species DETECTED (A) NOT DETECTED   Staphylococcus aureus NOT DETECTED NOT DETECTED   Methicillin resistance DETECTED (A) NOT DETECTED   Streptococcus species NOT DETECTED NOT DETECTED   Streptococcus agalactiae NOT DETECTED NOT DETECTED   Streptococcus pneumoniae NOT DETECTED NOT DETECTED   Streptococcus pyogenes NOT DETECTED NOT DETECTED   Acinetobacter baumannii NOT DETECTED NOT DETECTED   Enterobacteriaceae species NOT DETECTED NOT DETECTED   Enterobacter cloacae complex NOT DETECTED NOT DETECTED   Escherichia coli NOT DETECTED NOT DETECTED   Klebsiella oxytoca NOT DETECTED NOT DETECTED   Klebsiella pneumoniae NOT DETECTED NOT DETECTED   Proteus species NOT DETECTED NOT DETECTED   Serratia marcescens NOT DETECTED NOT DETECTED   Haemophilus influenzae NOT DETECTED NOT DETECTED   Neisseria meningitidis NOT DETECTED NOT DETECTED   Pseudomonas aeruginosa NOT DETECTED NOT DETECTED   Candida albicans NOT DETECTED NOT DETECTED   Candida glabrata NOT DETECTED NOT DETECTED   Candida krusei NOT DETECTED NOT DETECTED   Candida parapsilosis NOT DETECTED NOT DETECTED   Candida tropicalis NOT DETECTED NOT DETECTED    Narda Bonds 04/26/2017  4:55  AM

## 2017-04-27 ENCOUNTER — Inpatient Hospital Stay (HOSPITAL_COMMUNITY): Payer: Medicare Other

## 2017-04-27 DIAGNOSIS — I361 Nonrheumatic tricuspid (valve) insufficiency: Secondary | ICD-10-CM

## 2017-04-27 LAB — IRON AND TIBC: IRON: 26 ug/dL — AB (ref 28–170)

## 2017-04-27 LAB — CBC
HEMATOCRIT: 29.1 % — AB (ref 36.0–46.0)
HEMATOCRIT: 28.9 % — AB (ref 36.0–46.0)
HEMOGLOBIN: 9.1 g/dL — AB (ref 12.0–15.0)
HEMOGLOBIN: 8.9 g/dL — AB (ref 12.0–15.0)
MCH: 30.8 pg (ref 26.0–34.0)
MCH: 31.1 pg (ref 26.0–34.0)
MCHC: 31.3 g/dL (ref 30.0–36.0)
MCHC: 30.8 g/dL (ref 30.0–36.0)
MCV: 98.6 fL (ref 78.0–100.0)
MCV: 101 fL — AB (ref 78.0–100.0)
PLATELETS: 262 10*3/uL (ref 150–400)
Platelets: 300 10*3/uL (ref 150–400)
RBC: 2.95 MIL/uL — AB (ref 3.87–5.11)
RBC: 2.86 MIL/uL — AB (ref 3.87–5.11)
RDW: 18.1 % — ABNORMAL HIGH (ref 11.5–15.5)
RDW: 18.2 % — ABNORMAL HIGH (ref 11.5–15.5)
WBC: 17.3 10*3/uL — AB (ref 4.0–10.5)
WBC: 16.1 10*3/uL — AB (ref 4.0–10.5)

## 2017-04-27 LAB — GLUCOSE, CAPILLARY
GLUCOSE-CAPILLARY: 166 mg/dL — AB (ref 65–99)
Glucose-Capillary: 178 mg/dL — ABNORMAL HIGH (ref 65–99)
Glucose-Capillary: 65 mg/dL (ref 65–99)
Glucose-Capillary: 72 mg/dL (ref 65–99)
Glucose-Capillary: 80 mg/dL (ref 65–99)
Glucose-Capillary: 96 mg/dL (ref 65–99)

## 2017-04-27 LAB — ECHOCARDIOGRAM COMPLETE
CHL CUP RV SYS PRESS: 80 mmHg
FS: 36 % (ref 28–44)
HEIGHTINCHES: 65 in
IVS/LV PW RATIO, ED: 1.16
LA ID, A-P, ES: 16 mm
LA vol A4C: 39.4 ml
LADIAMINDEX: 0.93 cm/m2
LEFT ATRIUM END SYS DIAM: 16 mm
LV PW d: 11.8 mm — AB (ref 0.6–1.1)
LVOT SV: 37 mL
LVOT VTI: 16.1 cm
LVOT area: 2.27 cm2
LVOT peak vel: 91.2 cm/s
LVOTD: 17 mm
Reg peak vel: 404 cm/s
TAPSE: 14.8 mm
TRMAXVEL: 404 cm/s
WEIGHTICAEL: 2292.78 [oz_av]

## 2017-04-27 LAB — COMPREHENSIVE METABOLIC PANEL
ALT: 13 U/L — AB (ref 14–54)
AST: 26 U/L (ref 15–41)
Albumin: 2.7 g/dL — ABNORMAL LOW (ref 3.5–5.0)
Alkaline Phosphatase: 183 U/L — ABNORMAL HIGH (ref 38–126)
Anion gap: 7 (ref 5–15)
BILIRUBIN TOTAL: 1 mg/dL (ref 0.3–1.2)
BUN: 38 mg/dL — ABNORMAL HIGH (ref 6–20)
CHLORIDE: 102 mmol/L (ref 101–111)
CO2: 33 mmol/L — ABNORMAL HIGH (ref 22–32)
Calcium: 8.5 mg/dL — ABNORMAL LOW (ref 8.9–10.3)
Creatinine, Ser: 0.69 mg/dL (ref 0.44–1.00)
Glucose, Bld: 110 mg/dL — ABNORMAL HIGH (ref 65–99)
POTASSIUM: 2.9 mmol/L — AB (ref 3.5–5.1)
Sodium: 142 mmol/L (ref 135–145)
TOTAL PROTEIN: 4.7 g/dL — AB (ref 6.5–8.1)

## 2017-04-27 LAB — HEPARIN LEVEL (UNFRACTIONATED)
Heparin Unfractionated: 0.2 IU/mL — ABNORMAL LOW (ref 0.30–0.70)
Heparin Unfractionated: 0.28 IU/mL — ABNORMAL LOW (ref 0.30–0.70)

## 2017-04-27 LAB — VITAMIN B12: VITAMIN B 12: 540 pg/mL (ref 180–914)

## 2017-04-27 LAB — MAGNESIUM: MAGNESIUM: 1.8 mg/dL (ref 1.7–2.4)

## 2017-04-27 LAB — FERRITIN: Ferritin: 205 ng/mL (ref 11–307)

## 2017-04-27 MED ORDER — IPRATROPIUM-ALBUTEROL 0.5-2.5 (3) MG/3ML IN SOLN
3.0000 mL | Freq: Three times a day (TID) | RESPIRATORY_TRACT | Status: DC
  Administered 2017-04-27 – 2017-05-01 (×7): 3 mL via RESPIRATORY_TRACT
  Filled 2017-04-27 (×12): qty 3

## 2017-04-27 MED ORDER — DIGOXIN 125 MCG PO TABS
0.1250 mg | ORAL_TABLET | Freq: Once | ORAL | Status: AC
Start: 2017-04-27 — End: 2017-04-27
  Administered 2017-04-27: 0.125 mg via ORAL
  Filled 2017-04-27: qty 1

## 2017-04-27 MED ORDER — ALBUMIN HUMAN 25 % IV SOLN
25.0000 g | INTRAVENOUS | Status: AC
  Administered 2017-04-27 (×2): 25 g via INTRAVENOUS
  Filled 2017-04-27: qty 50
  Filled 2017-04-27: qty 150

## 2017-04-27 MED ORDER — POTASSIUM CHLORIDE CRYS ER 20 MEQ PO TBCR
40.0000 meq | EXTENDED_RELEASE_TABLET | Freq: Once | ORAL | Status: AC
Start: 2017-04-27 — End: 2017-04-27
  Administered 2017-04-27: 40 meq via ORAL
  Filled 2017-04-27: qty 2

## 2017-04-27 MED ORDER — POTASSIUM CHLORIDE CRYS ER 20 MEQ PO TBCR
40.0000 meq | EXTENDED_RELEASE_TABLET | Freq: Once | ORAL | Status: AC
  Administered 2017-04-27: 40 meq via ORAL
  Filled 2017-04-27: qty 2

## 2017-04-27 NOTE — Progress Notes (Addendum)
Pt's BP is 85/66 and HR fluctuates between 110's to 140's. MD Emokpae made aware. Will continue to assess.

## 2017-04-27 NOTE — Progress Notes (Signed)
ANTICOAGULATION CONSULT NOTE   Pharmacy Consult for Heparin Indication: DVT  Patient Measurements: Height: _0  (165.1 cm) Weight: 143 lb 4.8 oz (65 kg) IBW/kg (Calculated) : 57 Heparin Dosing Weight: 45kg  Labs: Recent Labs    04/24/17 1654  04/25/17 0750  04/26/17 0342 04/26/17 1216 04/27/17 0458 04/27/17 1307  HGB  --    < > 11.4*  --  11.4*  --  9.1*  --   HCT  --   --  33.6*  --  35.1*  --  29.1*  --   PLT  --   --  349  --  356  --  300  --   HEPARINUNFRC  --   --   --    < > 0.65 0.55 0.20* 0.28*  CREATININE  --   --  1.23*  --  1.01*  --  0.69  --   CKTOTAL 37*  --   --   --   --   --   --   --    < > = values in this interval not displayed.   Assessment: 30 yof on IV heparin for acute DVT. Of note, pt with FOB+ so will not bolus. H/H slightly low and platelets are WNL.   Both the initial and confirmatory heparin levels were therapeutic, however last 2 readings were subtherapeutic. RN confirmed no issues with IV line and no interruptions to therapy. Hgb and PLTs are wnl. No bleeding reported.  Goal of Therapy:  Heparin level 0.3-0.7 units/ml Monitor platelets by anticoagulation protocol: Yes   Plan:  Increase heparin drip to 1000 units/hr Heparin level in 8 hours Daily heparin levels and CBC Monitor for bleeding  [name], PharmD Pharmacy Resident Pager: [phone]

## 2017-04-27 NOTE — Progress Notes (Signed)
  Echocardiogram 2D Echocardiogram has been performed.  Philippa Vessey T Jermall Isaacson 04/27/2017, 9:13 AM

## 2017-04-27 NOTE — Progress Notes (Signed)
Subjective: The patient is in the bed resting.  No acute pain.  Objective: Vital signs in last 24 hours: Temp:  [97.5 F (36.4 C)-97.9 F (36.6 C)] 97.5 F (36.4 C) (12/24 0917) Pulse Rate:  [83-142] 142 (12/24 0928) Resp:  [14-24] 24 (12/24 0928) BP: (74-86)/(54-70) 85/66 (12/24 0917) SpO2:  [90 %-100 %] 94 % (12/24 0932) Weight:  [65 kg (143 lb 4.8 oz)] 65 kg (143 lb 4.8 oz) (12/24 0500) Weight change: 5.8 kg (12 lb 12.6 oz) Last BM Date: 04/24/17  Intake/Output from previous day: 12/23 0701 - 12/24 0700 In: 3529.7 [P.O.:1260; I.V.:1819.7; IV Piggyback:350] Out: 950 [Urine:950] Intake/Output this shift: Total I/O In: -  Out: 1 [Stool:1]  General appearance: alert, no distress and pale Incision/Wound:the wound is incorporating the acell nicely.  It does not look infection.  There is significant skin thinning as expected with age.  Lab Results: Recent Labs    04/26/17 0342 04/27/17 0458  WBC 24.4* 17.3*  HGB 11.4* 9.1*  HCT 35.1* 29.1*  PLT 356 300   BMET Recent Labs    04/26/17 0342 04/27/17 0458  NA 141 142  K 3.6 2.9*  CL 101 102  CO2 32 33*  GLUCOSE 65 110*  BUN 52* 38*  CREATININE 1.01* 0.69  CALCIUM 7.6* 8.5*    Studies/Results: No results found.  Medications: I have reviewed the patient's current medications.  Assessment/Plan: Continue with KY gel daily and wrap with kerlex and ace wrap from the toes and knees.  LOS: 3 days    Wallace Going 04/27/2017

## 2017-04-27 NOTE — Progress Notes (Signed)
ANTICOAGULATION CONSULT NOTE   Pharmacy Consult for Heparin Indication: DVT  Patient Measurements: Height: _0  (165.1 cm) Weight: 130 lb 8.2 oz (59.2 kg) IBW/kg (Calculated) : 57 Heparin Dosing Weight: 45kg  Labs: Recent Labs    04/24/17 1654 04/25/17 0750 04/26/17 0342 04/26/17 1216 04/27/17 0458  HGB  --  11.4* 11.4*  --  9.1*  HCT  --  33.6* 35.1*  --  29.1*  PLT  --  349 356  --  300  HEPARINUNFRC  --   --  0.65 0.55 0.20*  CREATININE  --  1.23* 1.01*  --  0.69  CKTOTAL 37*  --   --   --   --    Assessment: 10 yof to start IV heparin for acute DVT. Of note, pt with FOB+ so will not bolus. H/H slightly low and platelets are WNL. Pt received prophylactic lovenox dose just prior to starting IV heparin.   Heparin level low this AM, drop in Hgb noted, no overt issues with bleeding or infusion per RN.   Goal of Therapy:  Heparin level 0.3-0.7 units/ml Monitor platelets by anticoagulation protocol: Yes   Plan:  Inc heparin to 900 units/hr 1400 HL Trend Hgb  Narda Bonds, PharmD, BCPS Clinical Pharmacist Phone: [phone]

## 2017-04-27 NOTE — Progress Notes (Signed)
Patient ID: Kriti Katayama, female   DOB: 31-Dec-1933, 83 y.o.   MRN: [id number]          [facility]    Date of Admission:  04/24/2017   Total days of antibiotics 27        Day 4 vancomycin and meropenem  I will need to discuss whether or not there is any active infection of her wounds with Dr. Marla Roe to determine if continued, broad antibiotic therapy is warranted.  I will follow-up after I have spoken with her.         Michel Bickers, MD [facility] for [place] [facility] [phone] pager   [phone] cell 04/27/2017, 3:43 PM

## 2017-04-27 NOTE — Progress Notes (Signed)
Pt has converted to a-fib/a-flutter. MD Emokpae made aware. Will continue to assess.

## 2017-04-27 NOTE — Progress Notes (Signed)
Pharmacy Antibiotic Note  Angel Estrada is a 83 y.o. female admitted on 04/24/2017 with sepsis. Pharmacy has been consulted for meropenem and vancomycin dosing. WBC improving but still elevated at 17, LA 1.8, afebrile. Renal function improved CrCl ~ 45-50 mL/min. Patient may have a PICC line infection per ID, also signs of cellulitis.    Plan: Merrem 1 gm IV Q 12 hours Vancomycin 750 mg IV Q 24 hours (trough 10-15) Monitor CBC, renal fxn, cultures and clinical progress Follow-up with ID on further plans  Height: _0  (165.1 cm) Weight: 143 lb 4.8 oz (65 kg) IBW/kg (Calculated) : 57  Temp (24hrs), Avg:97.6 F (36.4 C), Min:97.3 F (36.3 C), Max:97.9 F (36.6 C)  Recent Labs  Lab 04/24/17 1045  04/24/17 1220 04/24/17 1437 04/24/17 1709 04/24/17 2109 04/25/17 0750 04/26/17 0342 04/26/17 0957 04/26/17 1216 04/27/17 0458  WBC 29.9*  --   --   --   --   --  32.1* 24.4*  --   --  17.3*  CREATININE  --   --  1.13*  --   --   --  1.23* 1.01*  --   --  0.69  LATICACIDVEN  --    < >  --  2.84* 3.3* 2.2*  --   --  2.0* 1.8  --    < > = values in this interval not displayed.    Estimated Creatinine Clearance: 49.6 mL/min (by C-G formula based on SCr of 0.69 mg/dL).    Allergies  Allergen Reactions  . Penicillins Swelling and Rash    Site of swelling (?) Has patient had a PCN reaction causing immediate rash, facial/tongue/throat swelling, SOB or lightheadedness with hypotension: Yes Has patient had a PCN reaction causing severe rash involving mucus membranes or skin necrosis: No Has patient had a PCN reaction that required hospitalization: No Has patient had a PCN reaction occurring within the last 10 years: No If all of the above answers are "NO", then may proceed with Cephalosporin use.   Marland Kitchen Lisinopril-Hydrochlorothiazide Other (See Comments) and Cough    Pt's PCP took her off medication because of possible kidney damage  . Sulfa Antibiotics Other (See Comments)    Unknown  reaction-- childhood allergy   . Latex Rash    Antimicrobials this admission: Merem 12/21 >>  Vanc 12/21 >>   Dose adjustments this admission: None  Microbiology results: 12/21 BCx: 1 MRSE, other pending 12/21 UCx:   12/21 CDiff neg (was on doxy PTA)  [name], PharmD Pharmacy Resident Pager: [phone]

## 2017-04-27 NOTE — Progress Notes (Signed)
PROGRESS NOTE    Angel Estrada  TYO:[id number] DOB: Sep 10, 1933 DOA: 04/24/2017 PCP: Jani Gravel, MD   Brief Narrative:  83yo F WF from [facility] PMHx DVT   COPD with pulmonary hypertension, grade 1 diastolic dysfunction, dementia, and DM who was discharged to a SNF after an admission for a L LE cellulitis with sepsis, and returned to the ED this admit with AMS following several days of very poor intake.  During prior hospitalization the patient underwent surgical debridement and wound VAC placement.  ID was following during the hospitalization and recommended 6 weeks of IV Rocephin with Flagyl with last dose 05/14/17.  Patient was discharged with a PICC line in place in the right upper extremity.  Upon presentation this admit pt was found to have a blood pressure of 80/68, pulse 125, and new onset atrial fibrillation.  Labs revealed mild acute kidney injury, lactate of 4.98, white count 29,900.  Patient was noted to have acute urinary retention and a Foley catheter was placed with about 2 L returned.     Subjective: 04/27/17 awake and talkative, RN at bedside,  A/O 4. Negative CP, negative SOB.  Negative abdominal pain, negative N/V.  No fevers   Assessment & Plan:   Principal Problem:   Sepsis affecting skin ([place]) Active Problems:   New onset atrial fibrillation (H[facility])   Diabetes mellitus without complication ([place])   Acute kidney injury ([place])   HTN (hypertension)   Pulmonary hypertension due to COPD ([place])   Acute urinary retention   Dementia (suspected)   Leg edema, left  1)Severe Sepsis Unclear etiology- -Upon admission meets criteria for sepsis HR> 90, RR> 20, WBC> 12. In addition patient positive lactic acidosis, hypotension.  BP remains soft, day # 4 of vancomycin/meropenem (total days of antibiotics 27), WBC trending down (17k), afebrile,,Initially treated with fluids and stress dose steroids with minimal effect on BP, therefore discontinued. ???LLE cellulitis most likely  source of infection, Dr Marla Roe (Plastic surgery) and Dr. Megan Salon (ID) are following.  PTA antibiotics ceftriaxone/Flagyl 04/01/17>>> 04/24/17, Continue current antibiotics per ID.  Give albumin and IV fluids for pressure support  2)Social/Ethics-patient is a DNR/DNI with limitations, no pressors, consider midodrine  3)HFpEF-patient with history of chronic Diastolic CHF with pulmonary hypertension, last known EF 60-65%, no acute exacerbation at this time, -Strict in and out, Daily weight,   4)New-onset atrial fibrillation(CHA2DS2 VASc score is 4)-suspect arrhythmia secondary to sepsis with  electrolyte concerns, BP too soft to try Cardizem or metoprolol, okay to use digoxin, however monitor potassium levels closely  5)LEFT LEG cellulitis (discharged on 74/1)- Complicated wound with insect infestation contaminated by CAT hair, Per discharge summary on 04/11/17 patient underwent debridement by [place] surgery, was to continue wound VAC and follow-up in one week : Was seen by ID and instructed to continue Rocephin/Flagyl for 6 weeks with end date 05/14/17 -See severe sepsis above -Does not appear patient followed had initial follow-up with Dr.Claire Dillingham Plastic surgery    -Forwarded photo to  Dr. Marla Roe plastic surgery and discussed case. Recommendations: KY gel to area daily with Kerlix and Ace wrap  6)COPD-stable, continue Dulera and DuoNeb  7)Extensive L LE DVT-  LE Venous Doppler Positive for a near occlusive acute deep vein thrombosis of the proximal posterior tibial, popliteal, and femoral veins, Continue heparin drip per pharmacy. Fully anticoagulate, ??? High PE risk  8)Acute on Chronic Anemia-hemoglobin is down to 9.1 from 11.4, ??  GI losses (guaic +ve previously), no gross bleeding at  this time.  however patient has extensive DVT of the left lower extremity above #7, check stool occult blood, monitor H&H, continue IV heparin for now, transfuse as  needed  9)Acute Kidney Injury (baseline Cr 0.73)- resolved AKI -Monitor closely: Most likely secondary to sepsis/hypoperfusion Recent Labs  Lab 04/24/17 1220 04/25/17 0750 04/26/17 0342 04/27/17 0458  CREATININE 1.13* 1.23* 1.01* 0.69    10) Acute Urinary Retention- -Maintain Foley   11)Diabetes type 2 controlled with complication - -61/4/43 Hemoglobin A1c = 6.1 -Sensitive SSI  DVT prophylaxis: Heparin drip Code Status: DO NOT RESUSCITATE Family Communication: None Disposition Plan: TBD   Consultants:   Plastic surgery Infectious Disease  Procedures/Significant Events:  04/25/17 Doppler LEFT LLE: Results pending   Cultures 04/24/17... blood 1/2 positive coag negative staph (most likely contaminant)   04/24/17.... urine negative  Antimicrobials: Anti-infectives (From admission, onward)   Start     Stop   04/25/17 1500  vancomycin (VANCOCIN) IVPB 750 mg/150 ml premix         04/24/17 1400  meropenem (MERREM) 1 g in sodium chloride 0.9 % 100 mL IVPB         04/24/17 1400  vancomycin (VANCOCIN) IVPB 1000 mg/200 mL premix     04/24/17 1602      Continuous Infusions: . sodium chloride 100 mL/hr at 04/27/17 1205  . heparin 1,000 Units/hr (04/27/17 1421)  . meropenem (MERREM) IV Stopped (04/27/17 1020)  . vancomycin Stopped (04/27/17 1526)    Objective: Vitals:   04/27/17 1220 04/27/17 1413 04/27/17 1600 04/27/17 1608  BP: 97/72   (!) 85/74  Pulse: (!) 143 (!) 135  (!) 101  Resp: (!) 22 (!) 24  (!) 23  Temp:   (!) 97.5 F (36.4 C)   TempSrc:   Axillary   SpO2: 93% 99%  96%  Weight:      Height:        Intake/Output Summary (Last 24 hours) at 04/27/2017 1728 Last data filed at 04/27/2017 0958 Gross per 24 hour  Intake 2529.73 ml  Output 551 ml  Net 1978.73 ml   Filed Weights   04/25/17 0545 04/26/17 0500 04/27/17 0500  Weight: 55.6 kg (122 lb 9.2 oz) 59.2 kg (130 lb 8.2 oz) 65 kg (143 lb 4.8 oz)    Examination:   Physical Exam  Gen:- Awake  Alert,  In no apparent distress  HEENT:- [place].AT, No sclera icterus Neck-Supple Neck,No JVD,.  Lungs-  CTAB  CV- S1, S2 normal, irregularly irregular with heart rate in 120s,  Abd-  +ve B.Sounds, Abd Soft, No tenderness,    Extremity/Skin:-Right arm PICC line, LEFT lower extremity wound on shin negative discharge, does not appear infected (necrotic edges possibly?), Negative bowel odor. Covered and clean. DP/PT pulse barely palpable. RIGHT lower extremity DP/PT pulse one plus.  Neuro-generalized weakness without new focal deficits Psych-affect is appropriate GU-Foley without hematuria   CBC: Recent Labs  Lab 04/24/17 1045 04/25/17 0750 04/26/17 0342 04/27/17 0458  WBC 29.9* 32.1* 24.4* 17.3*  HGB 13.0 11.4* 11.4* 9.1*  HCT 39.6 33.6* 35.1* 29.1*  MCV 96.1 96.0 97.2 98.6  PLT 399 349 356 154   Basic Metabolic Panel: Recent Labs  Lab 04/24/17 1220 04/25/17 0750 04/26/17 0342 04/26/17 0957 04/27/17 0458  NA 139 138 141  --  142  K 3.9 4.2 3.6  --  2.9*  CL 107 97* 101  --  102  CO2 23 26 32  --  33*  GLUCOSE 91 86 65  --  110*  BUN 50* 57* 52*  --  38*  CREATININE 1.13* 1.23* 1.01*  --  0.69  CALCIUM 5.2* 7.1* 7.6*  --  8.5*  MG  --   --   --  2.1 1.8   GFR: Estimated Creatinine Clearance: 49.6 mL/min (by C-G formula based on SCr of 0.69 mg/dL). Liver Function Tests: Recent Labs  Lab 04/24/17 1220 04/25/17 0750 04/26/17 0342 04/27/17 0458  AST _0 ALT 9* 12* 12* 13*  ALKPHOS 134* 177* 170* 183*  BILITOT 0.6 0.9 0.9 1.0  PROT 3.4* 4.4* 4.5* 4.7*  ALBUMIN 1.2* 1.7* 1.6* 2.7*   No results for input(s): LIPASE, AMYLASE in the last 168 hours. No results for input(s): AMMONIA in the last 168 hours. Coagulation Profile: No results for input(s): INR, PROTIME in the last 168 hours. Cardiac Enzymes: Recent Labs  Lab 04/24/17 1654  CKTOTAL 37*   BNP (last 3 results) No results for input(s): PROBNP in the last 8760 hours. HbA1C: No results for input(s):  HGBA1C in the last 72 hours. CBG: Recent Labs  Lab 04/26/17 2041 04/27/17 0014 04/27/17 0412 04/27/17 1158 04/27/17 1616  GLUCAP 124* 80 72 166* 178*   Lipid Profile: No results for input(s): CHOL, HDL, LDLCALC, TRIG, CHOLHDL, LDLDIRECT in the last 72 hours. Thyroid Function Tests: No results for input(s): TSH, T4TOTAL, FREET4, T3FREE, THYROIDAB in the last 72 hours. Anemia Panel: Recent Labs    04/26/17 2130  VITAMINB12 540  FOLATE 20.7  FERRITIN 205  TIBC NOT CALCULATED  IRON 26*  RETICCTPCT 1.8   Urine analysis:    Component Value Date/Time   COLORURINE AMBER (A) 04/25/2017 0820   APPEARANCEUR HAZY (A) 04/25/2017 0820   LABSPEC 1.016 04/25/2017 0820   PHURINE 5.0 04/25/2017 0820   GLUCOSEU NEGATIVE 04/25/2017 0820   HGBUR LARGE (A) 04/25/2017 0820   BILIRUBINUR NEGATIVE 04/25/2017 0820   KETONESUR NEGATIVE 04/25/2017 0820   PROTEINUR NEGATIVE 04/25/2017 0820   NITRITE NEGATIVE 04/25/2017 0820   LEUKOCYTESUR TRACE (A) 04/25/2017 0820   Sepsis Labs: _1 (procalcitonin:4,lacticidven:4)  ) Recent Results (from the past 240 hour(s))  Culture, blood (routine x 2)     Status: None (Preliminary result)   Collection Time: 04/24/17 12:20 PM  Result Value Ref Range Status   Specimen Description BLOOD RIGHT FOREARM  Final   Special Requests IN PEDIATRIC BOTTLE Blood Culture adequate volume  Final   Culture NO GROWTH 3 DAYS  Final   Report Status PENDING  Incomplete  Urine culture     Status: None   Collection Time: 04/24/17  1:00 PM  Result Value Ref Range Status   Specimen Description URINE, RANDOM  Final   Special Requests NONE  Final   Culture NO GROWTH  Final   Report Status 04/25/2017 FINAL  Final  Culture, blood (routine x 2)     Status: Abnormal (Preliminary result)   Collection Time: 04/24/17  1:25 PM  Result Value Ref Range Status   Specimen Description BLOOD PICC LINE  Final   Special Requests   Final    BOTTLES DRAWN AEROBIC AND ANAEROBIC Blood  Culture adequate volume   Culture  Setup Time   Final    AEROBIC BOTTLE ONLY GRAM POSITIVE COCCI IN CLUSTERS CRITICAL RESULT CALLED TO, READ BACK BY AND VERIFIED WITH: JLEDFORD,PHARMD BY [facility] 04/26/17 AT 3:57AM    Culture (A)  Final    STAPHYLOCOCCUS SPECIES (COAGULASE NEGATIVE) THE SIGNIFICANCE OF ISOLATING THIS ORGANISM FROM A SINGLE SET OF  BLOOD CULTURES WHEN MULTIPLE SETS ARE DRAWN IS UNCERTAIN. PLEASE NOTIFY THE MICROBIOLOGY DEPARTMENT WITHIN ONE WEEK IF SPECIATION AND SENSITIVITIES ARE REQUIRED.    Report Status PENDING  Incomplete  Blood Culture ID Panel (Reflexed)     Status: Abnormal   Collection Time: 04/24/17  1:25 PM  Result Value Ref Range Status   Enterococcus species NOT DETECTED NOT DETECTED Final   Listeria monocytogenes NOT DETECTED NOT DETECTED Final   Staphylococcus species DETECTED (A) NOT DETECTED Final    Comment: Methicillin (oxacillin) resistant coagulase negative staphylococcus. Possible blood culture contaminant (unless isolated from more than one blood culture draw or clinical case suggests pathogenicity). No antibiotic treatment is indicated for blood  culture contaminants. CRITICAL RESULT CALLED TO, READ BACK BY AND VERIFIED WITH: TO JLEDFORD BY [facility] 04/26/17 AT 3:57AM    Staphylococcus aureus NOT DETECTED NOT DETECTED Final   Methicillin resistance DETECTED (A) NOT DETECTED Final    Comment: CRITICAL RESULT CALLED TO, READ BACK BY AND VERIFIED WITH: TO JLEDFORD BY [facility] 04/26/17 AT 3:57AM    Streptococcus species NOT DETECTED NOT DETECTED Final   Streptococcus agalactiae NOT DETECTED NOT DETECTED Final   Streptococcus pneumoniae NOT DETECTED NOT DETECTED Final   Streptococcus pyogenes NOT DETECTED NOT DETECTED Final   Acinetobacter baumannii NOT DETECTED NOT DETECTED Final   Enterobacteriaceae species NOT DETECTED NOT DETECTED Final   Enterobacter cloacae complex NOT DETECTED NOT DETECTED Final   Escherichia coli NOT DETECTED NOT  DETECTED Final   Klebsiella oxytoca NOT DETECTED NOT DETECTED Final   Klebsiella pneumoniae NOT DETECTED NOT DETECTED Final   Proteus species NOT DETECTED NOT DETECTED Final   Serratia marcescens NOT DETECTED NOT DETECTED Final   Haemophilus influenzae NOT DETECTED NOT DETECTED Final   Neisseria meningitidis NOT DETECTED NOT DETECTED Final   Pseudomonas aeruginosa NOT DETECTED NOT DETECTED Final   Candida albicans NOT DETECTED NOT DETECTED Final   Candida glabrata NOT DETECTED NOT DETECTED Final   Candida krusei NOT DETECTED NOT DETECTED Final   Candida parapsilosis NOT DETECTED NOT DETECTED Final   Candida tropicalis NOT DETECTED NOT DETECTED Final  MRSA PCR Screening     Status: None   Collection Time: 04/24/17  6:39 PM  Result Value Ref Range Status   MRSA by PCR NEGATIVE NEGATIVE Final    Comment:        The GeneXpert MRSA Assay (FDA approved for NASAL specimens only), is one component of a comprehensive MRSA colonization surveillance program. It is not intended to diagnose MRSA infection nor to guide or monitor treatment for MRSA infections.   C difficile quick scan w PCR reflex     Status: None   Collection Time: 04/25/17  8:28 AM  Result Value Ref Range Status   C Diff antigen NEGATIVE NEGATIVE Final   C Diff toxin NEGATIVE NEGATIVE Final   C Diff interpretation No C. difficile detected.  Final       Imaging Orders     DG Chest Portable 1 View     VAS [place] LOWER EXTREMITY VENOUS (DVT)    Scheduled Meds: . digoxin  0.125 mg Oral Once  . insulin aspart  0-9 Units Subcutaneous Q4H  . ipratropium-albuterol  3 mL Nebulization TID  . mometasone-formoterol  2 puff Inhalation BID  . potassium chloride  40 mEq Oral Once   Continuous Infusions: . sodium chloride 100 mL/hr at 04/27/17 1205  . heparin 1,000 Units/hr (04/27/17 1421)  . meropenem (MERREM) IV Stopped (04/27/17 1020)  .  vancomycin Stopped (04/27/17 1526)     LOS: 3 days   Roxan Hockey, MD [facility] Pager [phone]   If 7PM-7AM, please contact night-coverage [email or web address] Password [facility] 04/27/2017, 5:28 PM

## 2017-04-28 LAB — CBC
HEMATOCRIT: 26.1 % — AB (ref 36.0–46.0)
HEMOGLOBIN: 8.3 g/dL — AB (ref 12.0–15.0)
MCH: 32.2 pg (ref 26.0–34.0)
MCHC: 31.8 g/dL (ref 30.0–36.0)
MCV: 101.2 fL — AB (ref 78.0–100.0)
Platelets: 224 10*3/uL (ref 150–400)
RBC: 2.58 MIL/uL — AB (ref 3.87–5.11)
RDW: 18.5 % — ABNORMAL HIGH (ref 11.5–15.5)
WBC: 12.7 10*3/uL — AB (ref 4.0–10.5)

## 2017-04-28 LAB — BASIC METABOLIC PANEL
ANION GAP: 3 — AB (ref 5–15)
BUN: 35 mg/dL — ABNORMAL HIGH (ref 6–20)
CALCIUM: 8.7 mg/dL — AB (ref 8.9–10.3)
CHLORIDE: 106 mmol/L (ref 101–111)
CO2: 33 mmol/L — AB (ref 22–32)
Creatinine, Ser: 0.7 mg/dL (ref 0.44–1.00)
GFR calc non Af Amer: >60 mL/min (ref >60)
GLUCOSE: 150 mg/dL — AB (ref 65–99)
POTASSIUM: 4.6 mmol/L (ref 3.5–5.1)
Sodium: 142 mmol/L (ref 135–145)

## 2017-04-28 LAB — GLUCOSE, CAPILLARY
GLUCOSE-CAPILLARY: 104 mg/dL — AB (ref 65–99)
GLUCOSE-CAPILLARY: 58 mg/dL — AB (ref 65–99)
GLUCOSE-CAPILLARY: 81 mg/dL (ref 65–99)
GLUCOSE-CAPILLARY: 92 mg/dL (ref 65–99)
GLUCOSE-CAPILLARY: 99 mg/dL (ref 65–99)
Glucose-Capillary: 126 mg/dL — ABNORMAL HIGH (ref 65–99)
Glucose-Capillary: 106 mg/dL — ABNORMAL HIGH (ref 65–99)
Glucose-Capillary: 121 mg/dL — ABNORMAL HIGH (ref 65–99)

## 2017-04-28 LAB — HEPARIN LEVEL (UNFRACTIONATED)
HEPARIN UNFRACTIONATED: 0.34 [IU]/mL (ref 0.30–0.70)
Heparin Unfractionated: 0.26 IU/mL — ABNORMAL LOW (ref 0.30–0.70)
Heparin Unfractionated: 0.44 IU/mL (ref 0.30–0.70)

## 2017-04-28 LAB — MAGNESIUM: Magnesium: 1.7 mg/dL (ref 1.7–2.4)

## 2017-04-28 MED ORDER — INSULIN ASPART 100 UNIT/ML ~~LOC~~ SOLN
2.0000 [IU] | Freq: Three times a day (TID) | SUBCUTANEOUS | Status: DC
  Administered 2017-04-29: 2 [IU] via SUBCUTANEOUS

## 2017-04-28 MED ORDER — INSULIN ASPART 100 UNIT/ML ~~LOC~~ SOLN: 2.0000 [IU] | Freq: Three times a day (TID) | SUBCUTANEOUS | Status: DC

## 2017-04-28 MED ORDER — VANCOMYCIN HCL 500 MG IV SOLR
500.0000 mg | Freq: Two times a day (BID) | INTRAVENOUS | Status: DC
  Administered 2017-04-28 – 2017-04-29 (×2): 500 mg via INTRAVENOUS
  Filled 2017-04-28 (×4): qty 500

## 2017-04-28 MED ORDER — DEXTROSE 50 % IV SOLN
INTRAVENOUS | Status: AC
  Administered 2017-04-28: 50 mL
  Filled 2017-04-28: qty 50

## 2017-04-28 NOTE — Progress Notes (Signed)
PROGRESS NOTE    Angel Estrada  [name] DOB: May 14, 1933 DOA: 04/24/2017 PCP: Jani Gravel, MD   Brief Narrative:  83yo F WF from [facility] admitted on 04/24/2017 with leukocytosis, altered mentation, hypotension and a diagnosis of sepsis secondary to cellulitis and/or Picc line infection.  PMHx DVT, COPD with pulmonary hypertension, grade 1 diastolic dysfunction, dementia, and DM who was discharged to a SNF after an admission for a L LE cellulitis with sepsis, and returned to the ED this admit with AMS following several days of very poor intake.  During prior hospitalization the patient underwent surgical debridement and wound VAC placement.  ID was following during the hospitalization and recommended 6 weeks of IV Rocephin with Flagyl with last dose 05/14/17.  Patient was discharged with a PICC line in place in the right upper extremity.  Upon presentation this admit pt was found to have a blood pressure of 80/68, pulse 125, and new onset atrial fibrillation.  Labs revealed mild acute kidney injury, lactate of 4.98, white count 29,900.  Patient was noted to have acute urinary retention and a Foley catheter was placed with about 2 L returned.     Subjective:  awake, alert,  A/O 4.  Oral intake is fair, denies abdominal pain, complains of being cold, no fevers  Assessment & Plan:   Principal Problem:   Sepsis affecting skin ([place]) Active Problems:   New onset atrial fibrillation (HCC)   Diabetes mellitus without complication ([place])   Acute kidney injury ([place])   HTN (hypertension)   Pulmonary hypertension due to COPD ([place])   Acute urinary retention   Dementia (suspected)   Leg edema, left  1)Severe Sepsis Unclear Etiology- -  cellulitis and/or Picc line infection., Upon admission meets criteria for sepsis HR> 90, RR> 20, WBC> 29k,  hypotension and altered mentation with lactic acidosis, hypotension.  BP noted,  WBC trending down (12k), afebrile,,Initially treated with fluids and  stress dose steroids with minimal effect on BP, therefore discontinued.  Dr Marla Roe (Plastic surgery) and Dr. Megan Salon (ID) are following.  PTA antibiotics ceftriaxone/Flagyl 04/01/17>>> 04/24/17, Continue current antibiotics per ID team, day # 5 of vancomycin/meropenem (total days of antibiotics 28) use albumin and IV fluids as needed for pressure support  2)Social/Ethics-patient is a DNR/DNI with limitations, no pressors, may consider midodrine  3)HFpEF-patient with history of chronic Diastolic CHF with pulmonary hypertension, last known EF 60-65%, no acute exacerbation at this time, -Strict in and out, Daily weight,   4)New-onset Atrial Fibrillation(CHA2DS2 VASc score is 4)- suspect arrhythmia secondary to sepsis with  electrolyte concerns, BP too soft to try Cardizem or metoprolol, her heart rate improved with digoxin given on 04/27/2017.   5)LEFT LEG cellulitis (discharged on 56/3)- Complicated wound with insect infestation contaminated by CAT hair, Per discharge summary on 04/11/17 patient underwent debridement by Dr.Claire Dillingham Plastic surgery, was to continue wound VAC and follow-up in one week : Was seen by ID and instructed to continue Rocephin/Flagyl for 6 weeks with end date 05/14/17 -See #1 above -Does not appear patient followed had initial follow-up with [place] surgery    -Forwarded photo to  Dr. Marla Roe plastic surgery and discussed case. Recommendations: KY gel to area daily with Kerlix and Ace wrap  6)COPD-stable, continue Dulera and DuoNeb  7)Extensive L LE DVT-  LE Venous Doppler Positive for a near occlusive acute deep vein thrombosis of the proximal posterior tibial, popliteal, and femoral veins, Continue heparin drip per pharmacy. Fully anticoagulate, ??? High PE risk  8)Acute on Chronic Anemia-hemoglobin is down to 8.3 from 11.4, ??  GI losses (guaic +ve previously), no gross bleeding at this time.  however patient has extensive DVT of the  left lower extremity above #7,  stool occult blood test is pending,  monitor H&H, continue IV heparin for now, transfuse as needed, STOP Iv Heparin if obvious bleeding  9)Acute Kidney Injury (baseline Cr 0.73)- resolved AKI -Monitor closely: Most likely secondary to sepsis/hypoperfusion Recent Labs  Lab 04/24/17 1220 04/25/17 0750 04/26/17 0342 04/27/17 0458 04/28/17 0507  CREATININE 1.13* 1.23* 1.01* 0.69 0.70    10) Acute Urinary Retention- -Maintain Foley   11)Diabetes type 2 controlled with complication - -59/0/93 Hemoglobin A1c = 6.1, recurrent hypoglycemic episodes, Allow some permissive Hyperglycemia rather than risk life-threatening hypoglycemia in a patient with unreliable oral intake. c/n "Custom" Novolog/Humalog Sliding scale insulin with Accu-Cheks/Fingersticks as ordered  DVT prophylaxis: Heparin drip Code Status: DO NOT RESUSCITATE Family Communication: None Disposition Plan: TBD   Consultants:   Plastic surgery Infectious Disease  Procedures/Significant Events:  04/25/17 Doppler LEFT LLE: Extensive DVT   Cultures 04/24/17... blood 1/2 positive coag negative staph (most likely contaminant)   04/24/17.... urine negative  Antimicrobials: Anti-infectives (From admission, onward)   Start     Stop   04/25/17 1500  vancomycin (VANCOCIN) IVPB 750 mg/150 ml premix         04/24/17 1400  meropenem (MERREM) 1 g in sodium chloride 0.9 % 100 mL IVPB         04/24/17 1400  vancomycin (VANCOCIN) IVPB 1000 mg/200 mL premix     04/24/17 1602      Continuous Infusions: . sodium chloride 100 mL/hr at 04/28/17 1029  . heparin 1,100 Units/hr (04/28/17 0041)  . meropenem (MERREM) IV Stopped (04/28/17 1136)  . vancomycin 500 mg (04/28/17 1536)    Objective: Vitals:   04/28/17 1400 04/28/17 1419 04/28/17 1427 04/28/17 1500  BP:   (!) 95/58   Pulse:      Resp: (!) 22  (!) 21 (!) 21  Temp:      TempSrc:      SpO2:  100%    Weight:      Height:         Intake/Output Summary (Last 24 hours) at 04/28/2017 1610 Last data filed at 04/28/2017 1338 Gross per 24 hour  Intake 3703.47 ml  Output 481 ml  Net 3222.47 ml   Filed Weights   04/26/17 0500 04/27/17 0500 04/28/17 0500  Weight: 59.2 kg (130 lb 8.2 oz) 65 kg (143 lb 4.8 oz) 60.7 kg (133 lb 13.1 oz)    Examination:   Physical Exam  Gen:- Awake Alert,  In no apparent distress  HEENT:- [place].AT, No sclera icterus Neck-Supple Neck,No JVD,.  Lungs-  CTAB  CV- S1, S2 normal, irregularly irregular with heart rate in 104s,  Abd-  +ve B.Sounds, Abd Soft, No tenderness,    Extremity/Skin:-Right arm PICC line, LEFT lower extremity wound on shin negative discharge, does not appear infected (necrotic edges possibly?), Negative bowel odor. Covered and clean. DP/PT pulse barely palpable. RIGHT lower extremity DP/PT pulse one plus.  Neuro-generalized weakness without new focal deficits Psych-affect is appropriate GU-Foley without hematuria   CBC: Recent Labs  Lab 04/25/17 0750 04/26/17 0342 04/27/17 0458 04/27/17 2028 04/28/17 0507  WBC 32.1* 24.4* 17.3* 16.1* 12.7*  HGB 11.4* 11.4* 9.1* 8.9* 8.3*  HCT 33.6* 35.1* 29.1* 28.9* 26.1*  MCV 96.0 97.2 98.6 101.0* 101.2*  PLT 349 356 300 262  314   Basic Metabolic Panel: Recent Labs  Lab 04/24/17 1220 04/25/17 0750 04/26/17 0342 04/26/17 0957 04/27/17 0458 04/28/17 0507  NA 139 138 141  --  142 142  K 3.9 4.2 3.6  --  2.9* 4.6  CL 107 97* 101  --  102 106  CO2 23 26 32  --  33* 33*  GLUCOSE 91 86 65  --  110* 150*  BUN 50* 57* 52*  --  38* 35*  CREATININE 1.13* 1.23* 1.01*  --  0.69 0.70  CALCIUM 5.2* 7.1* 7.6*  --  8.5* 8.7*  MG  --   --   --  2.1 1.8 1.7   GFR: Estimated Creatinine Clearance: 49.6 mL/min (by C-G formula based on SCr of 0.7 mg/dL). Liver Function Tests: Recent Labs  Lab 04/24/17 1220 04/25/17 0750 04/26/17 0342 04/27/17 0458  AST _0 ALT 9* 12* 12* 13*  ALKPHOS 134* 177* 170* 183*   BILITOT 0.6 0.9 0.9 1.0  PROT 3.4* 4.4* 4.5* 4.7*  ALBUMIN 1.2* 1.7* 1.6* 2.7*   No results for input(s): LIPASE, AMYLASE in the last 168 hours. No results for input(s): AMMONIA in the last 168 hours. Coagulation Profile: No results for input(s): INR, PROTIME in the last 168 hours. Cardiac Enzymes: Recent Labs  Lab 04/24/17 1654  CKTOTAL 37*   BNP (last 3 results) No results for input(s): PROBNP in the last 8760 hours. HbA1C: No results for input(s): HGBA1C in the last 72 hours. CBG: Recent Labs  Lab 04/28/17 0032 04/28/17 0355 04/28/17 0425 04/28/17 0816 04/28/17 1226  GLUCAP 81 58* 126* 99 92   Lipid Profile: No results for input(s): CHOL, HDL, LDLCALC, TRIG, CHOLHDL, LDLDIRECT in the last 72 hours. Thyroid Function Tests: No results for input(s): TSH, T4TOTAL, FREET4, T3FREE, THYROIDAB in the last 72 hours. Anemia Panel: Recent Labs    04/26/17 2130  VITAMINB12 540  FOLATE 20.7  FERRITIN 205  TIBC NOT CALCULATED  IRON 26*  RETICCTPCT 1.8   Urine analysis:    Component Value Date/Time   COLORURINE AMBER (A) 04/25/2017 0820   APPEARANCEUR HAZY (A) 04/25/2017 0820   LABSPEC 1.016 04/25/2017 0820   PHURINE 5.0 04/25/2017 0820   GLUCOSEU NEGATIVE 04/25/2017 0820   HGBUR LARGE (A) 04/25/2017 0820   BILIRUBINUR NEGATIVE 04/25/2017 0820   KETONESUR NEGATIVE 04/25/2017 0820   PROTEINUR NEGATIVE 04/25/2017 0820   NITRITE NEGATIVE 04/25/2017 0820   LEUKOCYTESUR TRACE (A) 04/25/2017 0820   Sepsis Labs: _1 (procalcitonin:4,lacticidven:4)  ) Recent Results (from the past 240 hour(s))  Culture, blood (routine x 2)     Status: None (Preliminary result)   Collection Time: 04/24/17 12:20 PM  Result Value Ref Range Status   Specimen Description BLOOD RIGHT FOREARM  Final   Special Requests IN PEDIATRIC BOTTLE Blood Culture adequate volume  Final   Culture NO GROWTH 4 DAYS  Final   Report Status PENDING  Incomplete  Urine culture     Status: None    Collection Time: 04/24/17  1:00 PM  Result Value Ref Range Status   Specimen Description URINE, RANDOM  Final   Special Requests NONE  Final   Culture NO GROWTH  Final   Report Status 04/25/2017 FINAL  Final  Culture, blood (routine x 2)     Status: Abnormal (Preliminary result)   Collection Time: 04/24/17  1:25 PM  Result Value Ref Range Status   Specimen Description BLOOD PICC LINE  Final   Special Requests  Final    BOTTLES DRAWN AEROBIC AND ANAEROBIC Blood Culture adequate volume   Culture  Setup Time   Final    AEROBIC BOTTLE ONLY GRAM POSITIVE COCCI IN CLUSTERS CRITICAL RESULT CALLED TO, READ BACK BY AND VERIFIED WITH: JLEDFORD,PHARMD BY [facility] 04/26/17 AT 3:57AM    Culture (A)  Final    STAPHYLOCOCCUS SPECIES (COAGULASE NEGATIVE) THE SIGNIFICANCE OF ISOLATING THIS ORGANISM FROM A SINGLE SET OF BLOOD CULTURES WHEN MULTIPLE SETS ARE DRAWN IS UNCERTAIN. PLEASE NOTIFY THE MICROBIOLOGY DEPARTMENT WITHIN ONE WEEK IF SPECIATION AND SENSITIVITIES ARE REQUIRED. CULTURE REINCUBATED FOR BETTER GROWTH    Report Status PENDING  Incomplete  Blood Culture ID Panel (Reflexed)     Status: Abnormal   Collection Time: 04/24/17  1:25 PM  Result Value Ref Range Status   Enterococcus species NOT DETECTED NOT DETECTED Final   Listeria monocytogenes NOT DETECTED NOT DETECTED Final   Staphylococcus species DETECTED (A) NOT DETECTED Final    Comment: Methicillin (oxacillin) resistant coagulase negative staphylococcus. Possible blood culture contaminant (unless isolated from more than one blood culture draw or clinical case suggests pathogenicity). No antibiotic treatment is indicated for blood  culture contaminants. CRITICAL RESULT CALLED TO, READ BACK BY AND VERIFIED WITH: TO JLEDFORD BY [facility] 04/26/17 AT 3:57AM    Staphylococcus aureus NOT DETECTED NOT DETECTED Final   Methicillin resistance DETECTED (A) NOT DETECTED Final    Comment: CRITICAL RESULT CALLED TO, READ BACK BY AND VERIFIED  WITH: TO JLEDFORD BY TC[facility]AND 04/26/17 AT 3:57AM    Streptococcus species NOT DETECTED NOT DETECTED Final   Streptococcus agalactiae NOT DETECTED NOT DETECTED Final   Streptococcus pneumoniae NOT DETECTED NOT DETECTED Final   Streptococcus pyogenes NOT DETECTED NOT DETECTED Final   Acinetobacter baumannii NOT DETECTED NOT DETECTED Final   Enterobacteriaceae species NOT DETECTED NOT DETECTED Final   Enterobacter cloacae complex NOT DETECTED NOT DETECTED Final   Escherichia coli NOT DETECTED NOT DETECTED Final   Klebsiella oxytoca NOT DETECTED NOT DETECTED Final   Klebsiella pneumoniae NOT DETECTED NOT DETECTED Final   Proteus species NOT DETECTED NOT DETECTED Final   Serratia marcescens NOT DETECTED NOT DETECTED Final   Haemophilus influenzae NOT DETECTED NOT DETECTED Final   Neisseria meningitidis NOT DETECTED NOT DETECTED Final   Pseudomonas aeruginosa NOT DETECTED NOT DETECTED Final   Candida albicans NOT DETECTED NOT DETECTED Final   Candida glabrata NOT DETECTED NOT DETECTED Final   Candida krusei NOT DETECTED NOT DETECTED Final   Candida parapsilosis NOT DETECTED NOT DETECTED Final   Candida tropicalis NOT DETECTED NOT DETECTED Final  MRSA PCR Screening     Status: None   Collection Time: 04/24/17  6:39 PM  Result Value Ref Range Status   MRSA by PCR NEGATIVE NEGATIVE Final    Comment:        The GeneXpert MRSA Assay (FDA approved for NASAL specimens only), is one component of a comprehensive MRSA colonization surveillance program. It is not intended to diagnose MRSA infection nor to guide or monitor treatment for MRSA infections.   C difficile quick scan w PCR reflex     Status: None   Collection Time: 04/25/17  8:28 AM  Result Value Ref Range Status   C Diff antigen NEGATIVE NEGATIVE Final   C Diff toxin NEGATIVE NEGATIVE Final   C Diff interpretation No C. difficile detected.  Final       Imaging Orders     DG Chest Portable 1 View     VAS [place]  LOWER  EXTREMITY VENOUS (DVT)    Scheduled Meds: . insulin aspart  2 Units Subcutaneous TID WC  . ipratropium-albuterol  3 mL Nebulization TID  . mometasone-formoterol  2 puff Inhalation BID   Continuous Infusions: . sodium chloride 100 mL/hr at 04/28/17 1029  . heparin 1,100 Units/hr (04/28/17 0041)  . meropenem (MERREM) IV Stopped (04/28/17 1136)  . vancomycin 500 mg (04/28/17 1536)     LOS: 4 days   Roxan Hockey, MD [facility] Pager [phone]   If 7PM-7AM, please contact night-coverage [email or web address] Password [email or web address] 04/28/2017, 4:10 PM

## 2017-04-28 NOTE — Progress Notes (Signed)
Hypoglycemic Event  CBG: 58  Treatment: Amp D50  Symptoms: none  Follow-up CBG: Time:0425 CBG Result:126  Possible Reasons for Event: unknown  Comments/MD notified:no    Owens Shark, Hector Shade

## 2017-04-28 NOTE — Progress Notes (Signed)
Pharmacy Antibiotic Note  Angel Estrada is a 83 y.o. female admitted on 04/24/2017 with sepsis. Pharmacy has been consulted for meropenem and vancomycin dosing. WBC improving but still elevated at 17, LA 1.8, afebrile. Renal function improved CrCl ~ 45-50 mL/min. Patient may have a PICC line infection per ID, also signs of cellulitis.    Due to the improvement in her renal function, the vancomcyin was adjusted to 547m Q12hrs.  Plan: Merrem 1 gm IV Q 12 hours Vancomycin 500 mg IV Q 12 hours (trough 10-15) Monitor CBC, renal fxn, cultures and clinical progress Follow-up with ID on further plans  Height: _0  (165.1 cm) Weight: 133 lb 13.1 oz (60.7 kg) IBW/kg (Calculated) : 57  Temp (24hrs), Avg:97.2 F (36.2 C), Min:96.5 F (35.8 C), Max:97.5 F (36.4 C)  Recent Labs  Lab 04/24/17 1220 04/24/17 1437 04/24/17 1709 04/24/17 2109 04/25/17 0750 04/26/17 0342 04/26/17 0957 04/26/17 1216 04/27/17 0458 04/27/17 2028 04/28/17 0507  WBC  --   --   --   --  32.1* 24.4*  --   --  17.3* 16.1* 12.7*  CREATININE 1.13*  --   --   --  1.23* 1.01*  --   --  0.69  --  0.70  LATICACIDVEN  --  2.84* 3.3* 2.2*  --   --  2.0* 1.8  --   --   --     Estimated Creatinine Clearance: 49.6 mL/min (by C-G formula based on SCr of 0.7 mg/dL).    Allergies  Allergen Reactions  . Penicillins Swelling and Rash    Site of swelling (?) Has patient had a PCN reaction causing immediate rash, facial/tongue/throat swelling, SOB or lightheadedness with hypotension: Yes Has patient had a PCN reaction causing severe rash involving mucus membranes or skin necrosis: No Has patient had a PCN reaction that required hospitalization: No Has patient had a PCN reaction occurring within the last 10 years: No If all of the above answers are "NO", then may proceed with Cephalosporin use.   .Marland KitchenLisinopril-Hydrochlorothiazide Other (See Comments) and Cough    Pt's PCP took her off medication because of possible kidney  damage  . Sulfa Antibiotics Other (See Comments)    Unknown reaction-- childhood allergy   . Latex Rash    Antimicrobials this admission: Merem 12/21 >>  Vanc 12/21 >>   Dose adjustments this admission: 12/25: with improved renal fx, increased vanc to 500 q12h from [id number]  Microbiology results: 12/21 BCx: 1 MRSE, other pending 12/21 UCx:  No growth 12/21 CDiff neg (was on doxy PTA)   AuPatterson HammersmithharmD PGY1 Pharmacy Practice Resident 04/28/2017 2:20 PM Pager: [phone]: [phone]

## 2017-04-28 NOTE — Progress Notes (Signed)
Hypoglycemic Event  CBG: 65  Treatment: apple Juice  Symptoms: none  Follow-up CBG: Time:0032 CBG Result:81  Possible Reasons for Event: unknown  Comments/MD notified:No    Owens Shark, Hector Shade

## 2017-04-28 NOTE — Progress Notes (Signed)
ANTICOAGULATION CONSULT NOTE   Pharmacy Consult for Heparin Indication: DVT  Patient Measurements: Height: _0  (165.1 cm) Weight: 133 lb 13.1 oz (60.7 kg) IBW/kg (Calculated) : 57 Heparin Dosing Weight: 45kg  Labs: Recent Labs    04/26/17 0342  04/27/17 0458 04/27/17 1307 04/27/17 2028 04/27/17 2229 04/28/17 0507 04/28/17 1032  HGB 11.4*  --  9.1*  --  8.9*  --  8.3*  --   HCT 35.1*  --  29.1*  --  28.9*  --  26.1*  --   PLT 356  --  300  --  262  --  224  --   HEPARINUNFRC 0.65   < > 0.20* 0.28*  --  0.26*  --  0.34  CREATININE 1.01*  --  0.69  --   --   --  0.70  --    < > = values in this interval not displayed.   Assessment: 89 yof to start IV heparin for acute DVT. Of note, pt with FOB+ so bolus was not used. H/H low and platelets are WNL. No bleeding noted.  Heparin level was therapeutic this morning after increasing the drip rate. Will continue current rate and get a confirmatory level this afternoon.   Goal of Therapy:  Heparin level 0.3-0.7 units/ml Monitor platelets by anticoagulation protocol: Yes   Plan:  Continue heparin at 1100 units/hr HL in 8hrs Monitor daily HL, CBC, s/sx of bleeding   Patterson Hammersmith PharmD PGY1 Pharmacy Practice Resident 04/28/2017 11:55 AM Pager: [phone] Phone: [phone]

## 2017-04-28 NOTE — Progress Notes (Signed)
[facility] notified that patient had 2 hypoglycemic events. Arthor Captain LPN

## 2017-04-28 NOTE — Progress Notes (Signed)
ANTICOAGULATION CONSULT NOTE   Pharmacy Consult for Heparin Indication: DVT  Patient Measurements: Height: _0  (165.1 cm) Weight: 143 lb 4.8 oz (65 kg) IBW/kg (Calculated) : 57 Heparin Dosing Weight: 45kg  Labs: Recent Labs    04/25/17 0750 04/26/17 0342  04/27/17 0458 04/27/17 1307 04/27/17 2028 04/27/17 2229  HGB 11.4* 11.4*  --  9.1*  --  8.9*  --   HCT 33.6* 35.1*  --  29.1*  --  28.9*  --   PLT 349 356  --  300  --  262  --   HEPARINUNFRC  --  0.65   < > 0.20* 0.28*  --  0.26*  CREATININE 1.23* 1.01*  --  0.69  --   --   --    < > = values in this interval not displayed.   Assessment: 36 yof to start IV heparin for acute DVT. Of note, pt with FOB+ so will not bolus. H/H slightly low and platelets are WNL. Pt received prophylactic lovenox dose just prior to starting IV heparin.   Heparin level low tonight, watching Hgb closely  Goal of Therapy:  Heparin level 0.3-0.7 units/ml Monitor platelets by anticoagulation protocol: Yes   Plan:  Inc heparin to 1100 units/hr 0900 HL Trend Hgb  Narda Bonds, PharmD, BCPS Clinical Pharmacist Phone: [phone]

## 2017-04-28 NOTE — Progress Notes (Signed)
ANTICOAGULATION CONSULT NOTE   Pharmacy Consult for Heparin Indication: DVT  Patient Measurements: Height: 5' 5" (165.1 cm) Weight: 133 lb 13.1 oz (60.7 kg) IBW/kg (Calculated) : 57 Heparin Dosing Weight: 45kg  Labs: Recent Labs    04/26/17 0342  04/27/17 0458  04/27/17 2028 04/27/17 2229 04/28/17 0507 04/28/17 1032 04/28/17 2024  HGB 11.4*  --  9.1*  --  8.9*  --  8.3*  --   --   HCT 35.1*  --  29.1*  --  28.9*  --  26.1*  --   --   PLT 356  --  300  --  262  --  224  --   --   HEPARINUNFRC 0.65   < > 0.20*   < >  --  0.26*  --  0.34 0.44  CREATININE 1.01*  --  0.69  --   --   --  0.70  --   --    < > = values in this interval not displayed.   Assessment: 8 yof to start IV heparin for acute DVT. Of note, pt with FOB+ so bolus was not used. H/H low and platelets are WNL. No bleeding noted.  Heparin level was therapeutic   Goal of Therapy:  Heparin level 0.3-0.7 units/ml Monitor platelets by anticoagulation protocol: Yes   Plan:  Continue heparin at 1100 units/hr Monitor daily HL, CBC, s/sx of bleeding  Levester Fresh, PharmD, BCPS, BCCCP Clinical Pharmacist Clinical phone for 04/28/2017 from 7a-3:30p: [phone] If after 3:30p, please call main pharmacy at: [phone] 04/28/2017 8:59 PM

## 2017-04-29 LAB — BASIC METABOLIC PANEL
Anion gap: 3 — ABNORMAL LOW (ref 5–15)
BUN: 27 mg/dL — AB (ref 6–20)
CHLORIDE: 107 mmol/L (ref 101–111)
CO2: 32 mmol/L (ref 22–32)
Calcium: 8.6 mg/dL — ABNORMAL LOW (ref 8.9–10.3)
Creatinine, Ser: 0.51 mg/dL (ref 0.44–1.00)
GFR calc Af Amer: >60 mL/min (ref >60)
GLUCOSE: 115 mg/dL — AB (ref 65–99)
POTASSIUM: 4.2 mmol/L (ref 3.5–5.1)
Sodium: 142 mmol/L (ref 135–145)

## 2017-04-29 LAB — CBC
HCT: 30.4 % — ABNORMAL LOW (ref 36.0–46.0)
HEMOGLOBIN: 9.9 g/dL — AB (ref 12.0–15.0)
MCH: 32.8 pg (ref 26.0–34.0)
MCHC: 32.6 g/dL (ref 30.0–36.0)
MCV: 100.7 fL — ABNORMAL HIGH (ref 78.0–100.0)
Platelets: 266 10*3/uL (ref 150–400)
RBC: 3.02 MIL/uL — ABNORMAL LOW (ref 3.87–5.11)
RDW: 18.1 % — ABNORMAL HIGH (ref 11.5–15.5)
WBC: 15.8 10*3/uL — ABNORMAL HIGH (ref 4.0–10.5)

## 2017-04-29 LAB — GLUCOSE, CAPILLARY
GLUCOSE-CAPILLARY: 101 mg/dL — AB (ref 65–99)
GLUCOSE-CAPILLARY: 101 mg/dL — AB (ref 65–99)
GLUCOSE-CAPILLARY: 55 mg/dL — AB (ref 65–99)
Glucose-Capillary: 89 mg/dL (ref 65–99)
Glucose-Capillary: 87 mg/dL (ref 65–99)

## 2017-04-29 LAB — CULTURE, BLOOD (ROUTINE X 2)
CULTURE: NO GROWTH
SPECIAL REQUESTS: ADEQUATE
Special Requests: ADEQUATE

## 2017-04-29 LAB — MAGNESIUM: MAGNESIUM: 1.6 mg/dL — AB (ref 1.7–2.4)

## 2017-04-29 LAB — OCCULT BLOOD X 1 CARD TO LAB, STOOL
Fecal Occult Bld: NEGATIVE
Fecal Occult Bld: POSITIVE — AB

## 2017-04-29 LAB — HEPARIN LEVEL (UNFRACTIONATED): Heparin Unfractionated: 0.45 IU/mL (ref 0.30–0.70)

## 2017-04-29 MED ORDER — POLYVINYL ALCOHOL 1.4 % OP SOLN: 1.0000 [drp] | Freq: Four times a day (QID) | OPHTHALMIC | Status: DC | PRN

## 2017-04-29 MED ORDER — MAGIC MOUTHWASH W/LIDOCAINE: 15.0000 mL | Freq: Four times a day (QID) | ORAL | Status: DC | PRN

## 2017-04-29 MED ORDER — DEXTROSE 50 % IV SOLN: 50.0000 mL | Freq: Once | INTRAVENOUS | Status: DC

## 2017-04-29 MED ORDER — GLYCOPYRROLATE 0.2 MG/ML IJ SOLN: 0.2000 mg | INTRAMUSCULAR | Status: DC | PRN

## 2017-04-29 MED ORDER — ONDANSETRON HCL 4 MG/2ML IJ SOLN
4.0000 mg | Freq: Four times a day (QID) | INTRAMUSCULAR | Status: DC | PRN
Start: 2017-04-29 — End: 2017-05-01

## 2017-04-29 MED ORDER — ACETAMINOPHEN 325 MG PO TABS: 650.0000 mg | ORAL_TABLET | Freq: Four times a day (QID) | ORAL | Status: DC | PRN

## 2017-04-29 MED ORDER — LORAZEPAM 2 MG/ML IJ SOLN: 1.0000 mg | INTRAMUSCULAR | Status: DC | PRN

## 2017-04-29 MED ORDER — DIGOXIN 125 MCG PO TABS
0.1250 mg | ORAL_TABLET | Freq: Once | ORAL | Status: AC
  Administered 2017-04-29: 0.125 mg via ORAL
  Filled 2017-04-29: qty 1

## 2017-04-29 MED ORDER — SENNA 8.6 MG PO TABS: 1.0000 | ORAL_TABLET | Freq: Every evening | ORAL | Status: DC | PRN

## 2017-04-29 MED ORDER — DIPHENHYDRAMINE HCL 50 MG/ML IJ SOLN: 12.5000 mg | INTRAMUSCULAR | Status: DC | PRN

## 2017-04-29 MED ORDER — BIOTENE DRY MOUTH MT LIQD: 15.0000 mL | OROMUCOSAL | Status: DC | PRN

## 2017-04-29 MED ORDER — BISACODYL 10 MG RE SUPP: 10.0000 mg | Freq: Every day | RECTAL | Status: DC | PRN

## 2017-04-29 MED ORDER — ACETAMINOPHEN 650 MG RE SUPP: 650.0000 mg | Freq: Four times a day (QID) | RECTAL | Status: DC | PRN

## 2017-04-29 MED ORDER — DEXTROSE 50 % IV SOLN
INTRAVENOUS | Status: AC
  Administered 2017-04-29: 25 mL
  Filled 2017-04-29: qty 50

## 2017-04-29 MED ORDER — TEMAZEPAM 15 MG PO CAPS: 15.0000 mg | ORAL_CAPSULE | Freq: Every evening | ORAL | Status: DC | PRN

## 2017-04-29 MED ORDER — MORPHINE SULFATE (CONCENTRATE) 10 MG/0.5ML PO SOLN: 5.0000 mg | ORAL | Status: DC | PRN

## 2017-04-29 MED ORDER — RISPERIDONE 0.5 MG PO TBDP
0.5000 mg | ORAL_TABLET | Freq: Two times a day (BID) | ORAL | Status: DC
  Administered 2017-04-29 – 2017-05-01 (×5): 0.5 mg via ORAL
  Filled 2017-04-29 (×6): qty 1

## 2017-04-29 MED ORDER — LORAZEPAM 1 MG PO TABS: 1.0000 mg | ORAL_TABLET | ORAL | Status: DC | PRN

## 2017-04-29 MED ORDER — DEXTROSE-NACL 5-0.45 % IV SOLN
INTRAVENOUS | Status: DC
  Administered 2017-04-29 – 2017-04-30 (×2): 1000 mL via INTRAVENOUS
  Administered 2017-05-01: 01:00:00 via INTRAVENOUS

## 2017-04-29 MED ORDER — ALUM & MAG HYDROXIDE-SIMETH 200-200-20 MG/5ML PO SUSP: 30.0000 mL | Freq: Four times a day (QID) | ORAL | Status: DC | PRN

## 2017-04-29 MED ORDER — DEXTROSE-NACL 5-0.45 % IV SOLN: INTRAVENOUS | Status: DC

## 2017-04-29 MED ORDER — MORPHINE SULFATE (CONCENTRATE) 10 MG/0.5ML PO SOLN: 10.0000 mg | ORAL | Status: DC | PRN

## 2017-04-29 MED ORDER — MORPHINE SULFATE (PF) 2 MG/ML IV SOLN: 1.0000 mg | INTRAVENOUS | Status: DC | PRN

## 2017-04-29 MED ORDER — NYSTATIN 100000 UNIT/GM EX POWD: Freq: Three times a day (TID) | CUTANEOUS | Status: DC | PRN

## 2017-04-29 MED ORDER — LORAZEPAM 2 MG/ML PO CONC: 1.0000 mg | ORAL | Status: DC | PRN

## 2017-04-29 MED ORDER — GLYCOPYRROLATE 1 MG PO TABS: 1.0000 mg | ORAL_TABLET | ORAL | Status: DC | PRN

## 2017-04-29 MED ORDER — ALBUTEROL SULFATE (2.5 MG/3ML) 0.083% IN NEBU: 2.5000 mg | INHALATION_SOLUTION | RESPIRATORY_TRACT | Status: DC | PRN

## 2017-04-29 MED ORDER — ONDANSETRON 4 MG PO TBDP: 4.0000 mg | ORAL_TABLET | Freq: Four times a day (QID) | ORAL | Status: DC | PRN

## 2017-04-29 MED ORDER — DEXTROSE 50 % IV SOLN
INTRAVENOUS | Status: AC
  Administered 2017-04-29: 50 mL
  Filled 2017-04-29: qty 50

## 2017-04-29 NOTE — Progress Notes (Signed)
Hypoglycemic Event  CBG: 55  Treatment: D50 IV 25 mL  Symptoms: None  Follow-up CBG: Time: CBG Result:  Possible Reasons for Event: Inadequate meal intake  Comments/MD notified:Dr. Emokpae here and aware, ok with 1/2 amp D50     Angel Estrada

## 2017-04-29 NOTE — Progress Notes (Signed)
Patient ID: Angel Estrada, female   DOB: [birth year], 83 y.o.   MRN: [id number]          [facility] for Infectious Disease    Date of Admission:  04/24/2017     [name] has been transitioned to comfort measures only.  Antibiotics have been stopped.  I will sign off now.         Michel Bickers, MD [facility] [place] [facility] [phone] pager   [phone] cell 04/29/2017, 1:55 PM

## 2017-04-29 NOTE — Progress Notes (Signed)
Rectal bleeding---- STOP iv Heparin, cbc q 6 hrs, check PT/PTT/INR,  monitor Bp closely  Lab Results  Component Value Date   WBC 15.8 (H) 04/29/2017   HGB 9.9 (L) 04/29/2017   HCT 30.4 (L) 04/29/2017   MCV 100.7 (H) 04/29/2017   PLT 266 04/29/2017   Roxan Hockey, MD

## 2017-04-29 NOTE — Progress Notes (Signed)
Palliative Medicine RN Note: Consult order noted. Reviewed chart, history and visit with Dr Hilma Favors, who adjusted orders and outlined plan below:  Visited with patient and SO/partner Angel Estrada, who is at bedside. They confirm that goal is comfort and that they have been told that there are no further curative options due to new rectal bleed and infected wound.  Angel Estrada is in and out of a nap. She can be roused, but she is not consistently able to follow the conversation. Long discussion with Angel Estrada about Angel Estrada and her values and their belief that one of the kindest things we can offer those we love is peace, comfort, and dignity at the end of life. Angel Estrada and Angel Estrada have been together 41 years, and Angel Estrada is unable to care for [place] at home due to her physical condition. Angel Estrada and Terry's brother both want hospice pursued if possible.   Angel Estrada has a new LBIG, infected wounds, sepsis (unsure if related to wounds or infected PICC), DVT, afib, DM, AKI, HTN, pulmonary HTN, COPD, urinary retention (now with a foley), suspected dementia. She is having hypoglycemia that required D50 earlier today. Patient is no longer on CBG checks or antibiotics, and she, when not stimulated, was having 25 second periods of apnea at times during my visit. Angel Estrada reports she has completely stopped eating and drinking; I have requested yellow Jello, as this is the one thing she was willing to try to get down.   Should Angel Estrada survive the night, inpatient hospice would be an appropriate plan. However, she has had hypoglycemic events the last two nights, and we are no longer checking blood sugars. She is also at high risk to continue bleeding overnight. In the presence of apnea when not bothered, it is likely that prognosis is better measured in hours to maybe a few days. At this point, PMT recommends watching Terry overnight and re-evaluating tomorrow to see if she is stable for transport. Dr Hilma Favors adjusted medications/orders. Plan  for a team member to see her tomorrow. If she is stable, the family is requesting [employer], as they live in town, and her elderly partner has to rely on others to get her to the hospital to visit Angel Estrada.   Marjie Skiff Asante Blanda, RN, BSN, [facility] 04/29/2017 4:10 PM Office [phone]

## 2017-04-29 NOTE — Plan of Care (Addendum)
  Progressing Coping: Level of anxiety will decrease 04/29/2017 0412 - Progressing by Verne Grain, RN Skin Integrity: Risk for impaired skin integrity will decrease 04/29/2017 0412 - Progressing by Verne Grain, RN    Patient turned q2hours with pillow support Complains she wants to eat solid food.   mucousy red stool noted sent hemoccult card to lab.  Team notified. Response was to continue drip at this time.

## 2017-04-29 NOTE — Progress Notes (Addendum)
MD was informed that the patient has rectal bleeding. Per MD verbal order we hold the Heparin drip. Also, MD was informed that patient has edema (+4) all over her body. Will continue to monitor.

## 2017-04-29 NOTE — Progress Notes (Addendum)
PROGRESS NOTE    Angel Estrada  NWG:[id number] DOB: 13-Jul-1933 DOA: 04/24/2017 PCP: Jani Gravel, MD   Brief Narrative:  83yo F WF from [facility] SNF admitted on 04/24/2017 with leukocytosis, altered mentation, hypotension and a diagnosis of sepsis secondary to cellulitis and/or Picc line infection.  PMHx DVT, COPD with pulmonary hypertension, grade 1 diastolic dysfunction, dementia, and DM who was discharged to a SNF after an admission for a L LE cellulitis with sepsis, and returned to the ED this admit with AMS following several days of very poor intake.  During prior hospitalization the patient underwent surgical debridement and wound VAC placement.  ID was following during the hospitalization and recommended 6 weeks of IV Rocephin with Flagyl with last dose 05/14/17.  Patient was discharged with a PICC line in place in the right upper extremity.  Upon presentation this admit pt was found to have a blood pressure of 80/68, pulse 125, and new onset atrial fibrillation.  Labs revealed mild acute kidney injury, lactate of 4.98, white count 29,900.  Patient was noted to have acute urinary retention and a Foley catheter was placed with about 2 L returned.   Venous Doppler this admission showed extensive left lower extremity near occlusive DVT, patient was started on IV heparin without bolus due to heme positive stools.    On 04/29/2017 patient started to have more bright red blood per rectum, I performed a rectal exam with charge RN Ginger and RN Greta at bedside, bright red blood noted on exam glove and at anal orifice.  Family conference with patient and patient's significant other of 51 years (Ms [name] at [phone]) on speakerphone.  Patient's significant other spoke with patient brother and sister-in-law.  At this time patient, her significant other of 51 yrs, her brother and her sister-in-law have all decided to withdraw active/ aggressive treatment and initiate comfort/palliative care.   Patient remains a DNR/DNI with limitations of treatment and comfort measures only.  Pastoral and palliative care team to see patient. RN Ginger and RN Greta present during phone conference in patient's room on 04/29/17   Subjective:  awake, alert,  A/O 4.  Oral intake is fair, denies abdominal pain, had hypoglycemic episodes requiring IV dextrose infusion, patient now has rectal bleeding in the setting of IV heparin for DVT. please see note above regarding patient request for comfort care only  Assessment & Plan:   Principal Problem:   Sepsis affecting skin ([place]) Active Problems:   New onset atrial fibrillation ([place])   Diabetes mellitus without complication ([place])   Acute kidney injury ([place])   HTN (hypertension)   Pulmonary hypertension due to COPD ([place])   Acute urinary retention   Dementia (suspected)   Leg edema, left  1)Severe Sepsis Unclear Etiology- -  cellulitis and/or Picc line infection., Upon admission meets criteria for sepsis HR> 90, RR> 20, WBC> 29k,  hypotension and altered mentation with lactic acidosis, hypotension.  Initially treated with fluids and stress dose steroids with minimal effect on BP, therefore discontinued.  Dr Marla Roe (Plastic surgery) and Dr. Megan Salon (ID) are following.  PTA antibiotics ceftriaxone/Flagyl 04/01/17>>> 04/24/17,  , day # 6 of vancomycin/meropenem (total days of antibiotics 29) however her white count up to 15.8k, stop antibiotics, please see highlighted note above and below regarding patient's request for comfort measures only  2)Rectal Bleeding- On 04/29/2017 patient started to have more bright red blood per rectum, I performed a rectal exam with charge RN Ginger and RN Greta at bedside, bright red  blood noted on exam glove and at anal orifice.  Family conference with patient and patient's significant other of 51 years (Ms [name] at [phone]) on speakerphone.  Patient's significant other spoke with patient brother and sister-in-law.   At this time patient, her significant other of 51 yrs, her brother and her sister-in-law have all decided to withdraw active/ aggressive treatment and initiate comfort/palliative care.  Patient remains a DNR/DNI with limitations of treatment and comfort measures only.  Pastoral and palliative care team to see patient. RN Ginger and RN Greta present during phone conference in patient's room on 04/29/17   3)HFpEF-patient with history of chronic Diastolic CHF with pulmonary hypertension, last known EF 60-65%, no acute exacerbation at this time, - please see highlighted note above and below regarding patient's request for comfort measures only  4)New-onset Atrial Fibrillation(CHA2DS2 VASc score is 4)- suspect arrhythmia secondary to sepsis with  electrolyte concerns, BP too soft to try Cardizem or metoprolol, her heart rate improved with digoxin given on 04/27/2017. please see highlighted note above and below regarding patient's request for comfort measures only   5)LEFT LEG cellulitis (discharged on 27/0)- Complicated wound with insect infestation contaminated by CAT hair, Per discharge summary on 04/11/17 patient underwent debridement by Dr.Claire Dillingham Plastic surgery, was to continue wound VAC and follow-up in one week : Was seen by ID and instructed to continue Rocephin/Flagyl for 6 weeks with end date 05/14/17 -See #1 above -Does not appear patient followed had initial follow-up with [facility]    -Forwarded photo to  Dr. Marla Roe plastic surgery and discussed case. Recommendations: KY gel to area daily with Kerlix and Ace wrap- please see highlighted note above and below regarding patient's request for comfort measures only   6)COPD-stable, continue Dulera and DuoNeb, please see highlighted note above and below regarding patient's request for comfort measures only  7)Extensive L LE DVT-  LE Venous Doppler Positive for a near occlusive acute deep vein thrombosis of  the proximal posterior tibial, popliteal, and femoral veins,STOP heparin drip -- please see highlighted note above and below regarding patient's request for comfort measures only  8)Acute on Chronic Anemia- GI losses (guaic +ve previously), no gross bleeding at this time.  however patient has extensive DVT of the left lower extremity above #7,  please see highlighted note above and below regarding patient's request for comfort measures only  9)Acute Kidney Injury (baseline Cr 0.73)- resolved AKI -Monitor closely: Most likely secondary to sepsis/hypoperfusion Recent Labs  Lab 04/24/17 1220 04/25/17 0750 04/26/17 0342 04/27/17 0458 04/28/17 0507 04/29/17 0323  CREATININE 1.13* 1.23* 1.01* 0.69 0.70 0.51    10) Acute Urinary Retention- -Maintain Foley for palliative care purposes   11)Diabetes type 2 controlled with complication - -35/0/09 Hemoglobin A1c = 6.1, recurrent hypoglycemic episodes, Allow some permissive Hyperglycemia rather than risk life-threatening hypoglycemia in a patient with unreliable oral intake.  Start D5 half normal at 50 mL an hour at patient request to prevent further significant hypoglycemia  12)Social/Ethics- On 04/29/2017 patient started to have more bright red blood per rectum, I performed a rectal exam with charge RN Ginger and RN Greta at bedside, bright red blood noted on exam glove and at anal orifice.  Family conference with patient and patient's significant other of 51 years (Ms [name] at [phone]) on speakerphone.  Patient's significant other spoke with patient brother and sister-in-law.  At this time patient, her significant other of 51 yrs, her brother and her sister-in-law have all  decided to withdraw active/ aggressive treatment and initiate comfort/palliative care.  Patient remains a DNR/DNI with limitations of treatment and comfort measures only.  Pastoral and palliative care team to see patient. RN Ginger and RN Greta present during phone  conference in patient's room on 04/29/17 DVT prophylaxis:  -please see highlighted note above and below regarding patient's request for comfort measures only   Code Status: DO NOT RESUSCITATE- please see highlighted note above and below regarding patient's request for comfort measures only  Family Communication: please see highlighted note above and below regarding patient's request for comfort measures only  Disposition Plan:  ?? Hospice house, await palliative care consult  Consultants:   Plastic surgery Infectious Disease  Procedures/Significant Events:  04/25/17 Doppler LEFT LLE: Extensive DVT   Cultures 04/24/17... blood 1/2 positive coag negative staph (most likely contaminant)   04/24/17.... urine negative  Antimicrobials: Anti-infectives (From admission, onward)   Start     Stop   04/25/17 1500  vancomycin (VANCOCIN) IVPB 750 mg/150 ml premix         04/24/17 1400  meropenem (MERREM) 1 g in sodium chloride 0.9 % 100 mL IVPB         04/24/17 1400  vancomycin (VANCOCIN) IVPB 1000 mg/200 mL premix     04/24/17 1602      Continuous Infusions: . dextrose 5 % and 0.45% NaCl      Objective: Vitals:   04/29/17 0759 04/29/17 0800 04/29/17 1227 04/29/17 1253  BP: 102/79  105/69   Pulse: (!) 109  (!) 107   Resp: (!) 25  (!) 27   Temp:  (!) 97.3 F (36.3 C) 98.7 F (37.1 C) (!) 97.4 F (36.3 C)  TempSrc:   Oral   SpO2: 94%  98%   Weight:      Height:        Intake/Output Summary (Last 24 hours) at 04/29/2017 1322 Last data filed at 04/29/2017 1149 Gross per 24 hour  Intake 1299.33 ml  Output 900 ml  Net 399.33 ml   Filed Weights   04/27/17 0500 04/28/17 0500 04/29/17 0540  Weight: 65 kg (143 lb 4.8 oz) 60.7 kg (133 lb 13.1 oz) 63.4 kg (139 lb 12.4 oz)    Examination:  please see highlighted note above and below regarding patient's request for comfort measures only  Gen:- Awake Alert,  In no apparent distress  HEENT:- [place].AT, No sclera  icterus Neck-Supple Neck,No JVD,.  Lungs-  CTAB  CV- S1, S2 normal, irregularly irregular with heart rate in 108s,  Abd-  +ve B.Sounds, Abd Soft, No tenderness,   Rectal-rectal bleeding noted, please see rectal exam documentation above Extremity/Skin:-Right arm PICC line, LEFT lower extremity wound on shin negative discharge, does not appear infected (necrotic edges possibly?), Negative bowel odor. Covered and clean. DP/PT pulse barely palpable. RIGHT lower extremity DP/PT pulse one plus.  Neuro-generalized weakness without new focal deficits Psych-affect is appropriate GU-Foley without hematuria please see highlighted note above and below regarding patient's request for comfort measures only   CBC: Recent Labs  Lab 04/26/17 0342 04/27/17 0458 04/27/17 2028 04/28/17 0507 04/29/17 0323  WBC 24.4* 17.3* 16.1* 12.7* 15.8*  HGB 11.4* 9.1* 8.9* 8.3* 9.9*  HCT 35.1* 29.1* 28.9* 26.1* 30.4*  MCV 97.2 98.6 101.0* 101.2* 100.7*  PLT 356 300 262 224 381   Basic Metabolic Panel: Recent Labs  Lab 04/25/17 0750 04/26/17 0342 04/26/17 0957 04/27/17 0458 04/28/17 0507 04/29/17 0323  NA 138 141  --  142 142  142  K 4.2 3.6  --  2.9* 4.6 4.2  CL 97* 101  --  102 106 107  CO2 26 32  --  33* 33* 32  GLUCOSE 86 65  --  110* 150* 115*  BUN 57* 52*  --  38* 35* 27*  CREATININE 1.23* 1.01*  --  0.69 0.70 0.51  CALCIUM 7.1* 7.6*  --  8.5* 8.7* 8.6*  MG  --   --  2.1 1.8 1.7 1.6*   GFR: Estimated Creatinine Clearance: 49.6 mL/min (by C-G formula based on SCr of 0.51 mg/dL). Liver Function Tests: Recent Labs  Lab 04/24/17 1220 04/25/17 0750 04/26/17 0342 04/27/17 0458  AST _0 ALT 9* 12* 12* 13*  ALKPHOS 134* 177* 170* 183*  BILITOT 0.6 0.9 0.9 1.0  PROT 3.4* 4.4* 4.5* 4.7*  ALBUMIN 1.2* 1.7* 1.6* 2.7*   No results for input(s): LIPASE, AMYLASE in the last 168 hours. No results for input(s): AMMONIA in the last 168 hours. Coagulation Profile: No results for input(s):  INR, PROTIME in the last 168 hours. Cardiac Enzymes: Recent Labs  Lab 04/24/17 1654  CKTOTAL 37*   BNP (last 3 results) No results for input(s): PROBNP in the last 8760 hours. HbA1C: No results for input(s): HGBA1C in the last 72 hours. CBG: Recent Labs  Lab 04/28/17 2347 04/29/17 0342 04/29/17 0802 04/29/17 1218 04/29/17 1251  GLUCAP 104* 101* 101* 55* 87   Lipid Profile: No results for input(s): CHOL, HDL, LDLCALC, TRIG, CHOLHDL, LDLDIRECT in the last 72 hours. Thyroid Function Tests: No results for input(s): TSH, T4TOTAL, FREET4, T3FREE, THYROIDAB in the last 72 hours. Anemia Panel: Recent Labs    04/26/17 2130  VITAMINB12 540  FOLATE 20.7  FERRITIN 205  TIBC NOT CALCULATED  IRON 26*  RETICCTPCT 1.8   Urine analysis:    Component Value Date/Time   COLORURINE AMBER (A) 04/25/2017 0820   APPEARANCEUR HAZY (A) 04/25/2017 0820   LABSPEC 1.016 04/25/2017 0820   PHURINE 5.0 04/25/2017 0820   GLUCOSEU NEGATIVE 04/25/2017 0820   HGBUR LARGE (A) 04/25/2017 0820   BILIRUBINUR NEGATIVE 04/25/2017 0820   KETONESUR NEGATIVE 04/25/2017 0820   PROTEINUR NEGATIVE 04/25/2017 0820   NITRITE NEGATIVE 04/25/2017 0820   LEUKOCYTESUR TRACE (A) 04/25/2017 0820   Sepsis Labs: _1 (procalcitonin:4,lacticidven:4)  ) Recent Results (from the past 240 hour(s))  Culture, blood (routine x 2)     Status: None (Preliminary result)   Collection Time: 04/24/17 12:20 PM  Result Value Ref Range Status   Specimen Description BLOOD RIGHT FOREARM  Final   Special Requests IN PEDIATRIC BOTTLE Blood Culture adequate volume  Final   Culture NO GROWTH 4 DAYS  Final   Report Status PENDING  Incomplete  Urine culture     Status: None   Collection Time: 04/24/17  1:00 PM  Result Value Ref Range Status   Specimen Description URINE, RANDOM  Final   Special Requests NONE  Final   Culture NO GROWTH  Final   Report Status 04/25/2017 FINAL  Final  Culture, blood (routine x 2)     Status:  Abnormal   Collection Time: 04/24/17  1:25 PM  Result Value Ref Range Status   Specimen Description BLOOD PICC LINE  Final   Special Requests   Final    BOTTLES DRAWN AEROBIC AND ANAEROBIC Blood Culture adequate volume   Culture  Setup Time   Final    AEROBIC BOTTLE ONLY GRAM POSITIVE COCCI IN CLUSTERS CRITICAL  RESULT CALLED TO, READ BACK BY AND VERIFIED WITH: JLEDFORD,PHARMD BY [facility] 04/26/17 AT 3:57AM    Culture (A)  Final    STAPHYLOCOCCUS SPECIES (COAGULASE NEGATIVE) THE SIGNIFICANCE OF ISOLATING THIS ORGANISM FROM A SINGLE SET OF BLOOD CULTURES WHEN MULTIPLE SETS ARE DRAWN IS UNCERTAIN. PLEASE NOTIFY THE MICROBIOLOGY DEPARTMENT WITHIN ONE WEEK IF SPECIATION AND SENSITIVITIES ARE REQUIRED.    Report Status 04/29/2017 FINAL  Final  Blood Culture ID Panel (Reflexed)     Status: Abnormal   Collection Time: 04/24/17  1:25 PM  Result Value Ref Range Status   Enterococcus species NOT DETECTED NOT DETECTED Final   Listeria monocytogenes NOT DETECTED NOT DETECTED Final   Staphylococcus species DETECTED (A) NOT DETECTED Final    Comment: Methicillin (oxacillin) resistant coagulase negative staphylococcus. Possible blood culture contaminant (unless isolated from more than one blood culture draw or clinical case suggests pathogenicity). No antibiotic treatment is indicated for blood  culture contaminants. CRITICAL RESULT CALLED TO, READ BACK BY AND VERIFIED WITH: TO JLEDFORD BY [facility] 04/26/17 AT 3:57AM    Staphylococcus aureus NOT DETECTED NOT DETECTED Final   Methicillin resistance DETECTED (A) NOT DETECTED Final    Comment: CRITICAL RESULT CALLED TO, READ BACK BY AND VERIFIED WITH: TO JLEDFORD BY [facility] 04/26/17 AT 3:57AM    Streptococcus species NOT DETECTED NOT DETECTED Final   Streptococcus agalactiae NOT DETECTED NOT DETECTED Final   Streptococcus pneumoniae NOT DETECTED NOT DETECTED Final   Streptococcus pyogenes NOT DETECTED NOT DETECTED Final   Acinetobacter  baumannii NOT DETECTED NOT DETECTED Final   Enterobacteriaceae species NOT DETECTED NOT DETECTED Final   Enterobacter cloacae complex NOT DETECTED NOT DETECTED Final   Escherichia coli NOT DETECTED NOT DETECTED Final   Klebsiella oxytoca NOT DETECTED NOT DETECTED Final   Klebsiella pneumoniae NOT DETECTED NOT DETECTED Final   Proteus species NOT DETECTED NOT DETECTED Final   Serratia marcescens NOT DETECTED NOT DETECTED Final   Haemophilus influenzae NOT DETECTED NOT DETECTED Final   Neisseria meningitidis NOT DETECTED NOT DETECTED Final   Pseudomonas aeruginosa NOT DETECTED NOT DETECTED Final   Candida albicans NOT DETECTED NOT DETECTED Final   Candida glabrata NOT DETECTED NOT DETECTED Final   Candida krusei NOT DETECTED NOT DETECTED Final   Candida parapsilosis NOT DETECTED NOT DETECTED Final   Candida tropicalis NOT DETECTED NOT DETECTED Final  MRSA PCR Screening     Status: None   Collection Time: 04/24/17  6:39 PM  Result Value Ref Range Status   MRSA by PCR NEGATIVE NEGATIVE Final    Comment:        The GeneXpert MRSA Assay (FDA approved for NASAL specimens only), is one component of a comprehensive MRSA colonization surveillance program. It is not intended to diagnose MRSA infection nor to guide or monitor treatment for MRSA infections.   C difficile quick scan w PCR reflex     Status: None   Collection Time: 04/25/17  8:28 AM  Result Value Ref Range Status   C Diff antigen NEGATIVE NEGATIVE Final   C Diff toxin NEGATIVE NEGATIVE Final   C Diff interpretation No C. difficile detected.  Final      Imaging Orders     DG Chest Portable 1 View     VAS [place] LOWER EXTREMITY VENOUS (DVT)    Scheduled Meds: . insulin aspart  2 Units Subcutaneous TID WC  . ipratropium-albuterol  3 mL Nebulization TID  . risperiDONE  0.5 mg Oral BID   Continuous Infusions: .  dextrose 5 % and 0.45% NaCl     please see highlighted note above and below regarding patient's request for  comfort measures only   On 04/29/2017 patient started to have more bright red blood per rectum, I performed a rectal exam with charge RN Ginger and RN Greta at bedside, bright red blood noted on exam glove and at anal orifice.  Family conference with patient and patient's significant other of 51 years (Ms [name] at [phone]) on speakerphone.  Patient's significant other spoke with patient brother and sister-in-law.  At this time patient, her significant other of 51 yrs, her brother and her sister-in-law have all decided to withdraw active/ aggressive treatment and initiate comfort/palliative care.  Patient remains a DNR/DNI with limitations of treatment and comfort measures only.  Pastoral and palliative care team to see patient. RN Ginger and RN Greta present during phone conference in patient's room on 04/29/17   LOS: 5 days   Roxan Hockey, MD [facility] Pager [phone]   If 7PM-7AM, please contact night-coverage [email or web address] Password [email or web address] 04/29/2017, 1:22 PM

## 2017-04-29 NOTE — Progress Notes (Signed)
Met patient and housemate.  When another friend came she needed someone to roll her to the room due to physical limitations. I took housemate to lobby area and took friend to visit and back to lobby. They waited for valet parking to get car and go home.   Went back to visit patient and offer words of comfort. Angel Estrada, Chaplain   04/29/17 1700  Clinical Encounter Type  Visited With Patient;Patient and family together  Visit Type Spiritual support  Referral From Physician  Consult/Referral To Chaplain  Spiritual Encounters  Spiritual Needs Emotional;Other (Comment)  Stress Factors  Patient Stress Factors Not reviewed

## 2017-04-29 NOTE — Progress Notes (Signed)
After 25 ml 50% Dextrose patient's CBG came up to 87. Will continue to monitor.

## 2017-04-29 NOTE — Progress Notes (Signed)
ANTICOAGULATION CONSULT NOTE   Pharmacy Consult for Heparin Indication: DVT  Patient Measurements: Height: _0  (165.1 cm) Weight: 139 lb 12.4 oz (63.4 kg) IBW/kg (Calculated) : 57 Heparin Dosing Weight: 45kg  Labs: Recent Labs    04/27/17 0458  04/27/17 2028  04/28/17 0507 04/28/17 1032 04/28/17 2024 04/29/17 0323  HGB 9.1*  --  8.9*  --  8.3*  --   --  9.9*  HCT 29.1*  --  28.9*  --  26.1*  --   --  30.4*  PLT 300  --  262  --  224  --   --  266  HEPARINUNFRC 0.20*   < >  --    < >  --  0.34 0.44 0.45  CREATININE 0.69  --   --   --  0.70  --   --  0.51   < > = values in this interval not displayed.   Assessment: 69 yof to start IV heparin for acute DVT. Of note, pt with FOB+ so bolus was not used. PLTs wnl, hgb has gone up to 9.9. FOB+ again this morning after RN noticed red stool. MD wants to continue gtt at this time.  Heparin level was therapeutic on confirmation last night and again this morning with AM labs. Will continue current rate.  Goal of Therapy:  Heparin level 0.3-0.7 units/ml Monitor platelets by anticoagulation protocol: Yes   Plan:  Continue heparin at 1100 units/hr Follow plans for anticoagulation post heparin Monitor daily HL, CBC, s/sx of bleeding   Patterson Hammersmith PharmD PGY1 Pharmacy Practice Resident 04/29/2017 8:19 AM Pager: [phone] Phone: [phone]

## 2017-04-30 NOTE — Progress Notes (Signed)
PROGRESS NOTE    Angel Estrada  DGL:[id number] DOB: December 30, 1933 DOA: 04/24/2017 PCP: Jani Gravel, MD   Brief Narrative:  83yo F WF from [facility] SNF PMHx DVT   COPD with pulmonary hypertension, grade 1 diastolic dysfunction, dementia, and DM who was discharged to a SNF after an admission for a L LE cellulitis with sepsis, and returned to the ED this admit with AMS following several days of very poor intake.  During prior hospitalization the patient underwent surgical debridement and wound VAC placement.  ID was following during the hospitalization and recommended 6 weeks of IV Rocephin with Flagyl with last dose 05/14/17.  Patient was discharged with a PICC line in place in the right upper extremity.  Upon presentation this admit pt was found to have a blood pressure of 80/68, pulse 125, and new onset atrial fibrillation.  Labs revealed mild acute kidney injury, lactate of 4.98, white count 29,900.  Patient was noted to have acute urinary retention and a Foley catheter was placed with about 2 L returned.        Subjective: 12/27/ AO x4, negative CP, negative S OB.     Assessment & Plan:   Principal Problem:   Sepsis affecting skin ([place]) Active Problems:   New onset atrial fibrillation (HCC)   Diabetes mellitus without complication ([place])   Acute kidney injury ([place])   HTN (hypertension)   Pulmonary hypertension due to COPD ([place])   Acute urinary retention   Dementia (suspected)   Leg edema, left  Severe Sepsis Unclear etiology -Upon admission meets criteria for sepsis HR> 90, RR> 20, WBC> 12. In addition patient positive lactic acidosis, hypotension. -Initially treated with fluids and stress dose steroids with minimal effect on BP, therefore discontinued. -Patient with continued hypotension and leukocytosis. -LLE cellulitis most likely source of infection -ID has been consulted.   Per ID notes: PTA antibiotics ceftriaxone/Flagyl 11/28>>> 12/21  Continue current antibiotics per  ID   Hypotension -Secondary to sepsis -Albumin 75 g; repeat on 12/24 if still significantly low - normal saline 143m/hr  -Patient DO NOT RESUSCITATE therefore cannot use pressors could start Midodrin  Chronic Diastolic CHF -Strict in and out -Daily weight -Echocardiogram pending -Transfused for hemoglobin<8  New-onset atrial fibrillation(CHA2DS2 VASc score is 4) -Continued A. fib RVR: Most likely secondary to severe sepsis -Hopefully with albumin and hydration will decrease patient's RVR. -Would consider one dose digoxin  HTN -See CHF  Pulmonary HTN -See CHF   LEFT LEG cellulitis (discharged on 151/8 -Complicated wound with insect infestation contaminated by CAT hair -Per discharge summary on 12/8 patient underwent debridement by [facility], was to continue wound VAC and follow-up in one week : Was seen by ID and instructed to continue Rocephin/Flagyl for 6 weeks with end date 05/14/17 -See severe sepsis -Does not appear patient followed had initial follow-up with [facility]    -Forwarded photo to  Dr. DMarla Roeplastic surgery and discussed case. Recommendations: KY gel to area daily with Kerlix and Ace wrap   COPD -Dulera -DuoNeb TID    Extensive L LE DVT -Still awaiting official read. Contacted radiology on 12/24 if no official read available -Continue heparin drip per pharmacy. Fully anticoagulate. -If any signs or symptoms of respiratory distress consider PE and obtain CTA PE protocol  Acute Kidney Injury (baseline Cr 0.73) -Monitor closely: Most likely secondary to sepsis/hypoperfusion Recent Labs  Lab 04/24/17 1220 04/25/17 0750 04/26/17 0342 04/27/17 0458 04/28/17 0507 04/29/17 0323  CREATININE 1.13* 1.23*  1.01* 0.69 0.70 0.51  I-mproving with hydration   Acute urinary retention -Maintain Foley   Anemia -Guaiac positive -No sign of large scale bleed -Anemia panel pending Suspected mild  dementia    Diabetes type 2 controlled with complication -99/8 Hemoglobin A1c = 6.1 -Sensitive SSI  Goals of care -Patient is COMFORT CARE.  Will be discharged to [facility] on 12/28      DVT prophylaxis: Heparin drip Code Status: DO NOT RESUSCITATE Family Communication: None Disposition Plan: TBD   Consultants:  Phone consult Plastic surgery  Procedures/Significant Events:  12/22 Doppler LEFT LLE: Results pending     I have personally reviewed and interpreted all radiology studies and my findings are as above.  VENTILATOR SETTINGS:    Cultures 12/21 blood 1/2 positive coag negative staph (most likely contaminant)   12/21 urine negative   Antimicrobials: Anti-infectives (From admission, onward)   Start     Stop   04/25/17 1500  vancomycin (VANCOCIN) IVPB 750 mg/150 ml premix         04/24/17 1400  meropenem (MERREM) 1 g in sodium chloride 0.9 % 100 mL IVPB         04/24/17 1400  vancomycin (VANCOCIN) IVPB 1000 mg/200 mL premix     04/24/17 1602       Devices    LINES / TUBES:      Continuous Infusions: . dextrose 5 % and 0.45% NaCl 1,000 mL (04/30/17 0749)     Objective: Vitals:   04/29/17 1227 04/29/17 1253 04/29/17 2141 04/30/17 0528  BP: 105/69     Pulse: (!) 107     Resp: (!) 27     Temp: 98.7 F (37.1 C) (!) 97.4 F (36.3 C)    TempSrc: Oral     SpO2: 98%  95%   Weight:    145 lb 8.1 oz (66 kg)  Height:        Intake/Output Summary (Last 24 hours) at 04/30/2017 0844 Last data filed at 04/30/2017 0749 Gross per 24 hour  Intake 1295.83 ml  Output 402 ml  Net 893.83 ml   Filed Weights   04/28/17 0500 04/29/17 0540 04/30/17 0528  Weight: 133 lb 13.1 oz (60.7 kg) 139 lb 12.4 oz (63.4 kg) 145 lb 8.1 oz (66 kg)    Examination:  General:  A/O 4  No acute respiratory distress Neck:  Negative scars, masses, torticollis, lymphadenopathy, JVD Lungs: Clear to auscultation bilaterally without wheezes or crackles Cardiovascular:  Irregular irregular rhythm and rate, without murmur gallop or rub normal S1 and S2 Abdomen: negative abdominal pain, nondistended, positive soft, bowel sounds, no rebound, no ascites, no appreciable mass Extremities: LEFT lower extremity wound on shin negative discharge, does not appear infected (necrotic edges possibly?), Negative bowel odor. Covered and clean. DP/PT pulse barely palpable. RIGHT lower extremity DP/PT pulse one plus.  Psychiatric:  Patient sleepy not fully able to assess.  Central nervous system:  Cranial nerves II through XII intact, tongue/uvula midline, all extremities muscle strength 2-3/5, sensation intact throughout, negative dysarthria, negative expressive aphasia, negative receptive aphasia.  .     Data Reviewed: Care during the described time interval was provided by me .  I have reviewed this patient's available data, including medical history, events of note, physical examination, and all test results as part of my evaluation.   CBC: Recent Labs  Lab 04/26/17 0342 04/27/17 0458 04/27/17 2028 04/28/17 0507 04/29/17 0323  WBC 24.4* 17.3* 16.1* 12.7* 15.8*  HGB 11.4* 9.1*  8.9* 8.3* 9.9*  HCT 35.1* 29.1* 28.9* 26.1* 30.4*  MCV 97.2 98.6 101.0* 101.2* 100.7*  PLT 356 300 262 224 973   Basic Metabolic Panel: Recent Labs  Lab 04/25/17 0750 04/26/17 0342 04/26/17 0957 04/27/17 0458 04/28/17 0507 04/29/17 0323  NA 138 141  --  142 142 142  K 4.2 3.6  --  2.9* 4.6 4.2  CL 97* 101  --  102 106 107  CO2 26 32  --  33* 33* 32  GLUCOSE 86 65  --  110* 150* 115*  BUN 57* 52*  --  38* 35* 27*  CREATININE 1.23* 1.01*  --  0.69 0.70 0.51  CALCIUM 7.1* 7.6*  --  8.5* 8.7* 8.6*  MG  --   --  2.1 1.8 1.7 1.6*   GFR: Estimated Creatinine Clearance: 49.6 mL/min (by C-G formula based on SCr of 0.51 mg/dL). Liver Function Tests: Recent Labs  Lab 04/24/17 1220 04/25/17 0750 04/26/17 0342 04/27/17 0458  AST _0 ALT 9* 12* 12* 13*  ALKPHOS 134* 177*  170* 183*  BILITOT 0.6 0.9 0.9 1.0  PROT 3.4* 4.4* 4.5* 4.7*  ALBUMIN 1.2* 1.7* 1.6* 2.7*   No results for input(s): LIPASE, AMYLASE in the last 168 hours. No results for input(s): AMMONIA in the last 168 hours. Coagulation Profile: No results for input(s): INR, PROTIME in the last 168 hours. Cardiac Enzymes: Recent Labs  Lab 04/24/17 1654  CKTOTAL 37*   BNP (last 3 results) No results for input(s): PROBNP in the last 8760 hours. HbA1C: No results for input(s): HGBA1C in the last 72 hours. CBG: Recent Labs  Lab 04/28/17 2347 04/29/17 0342 04/29/17 0802 04/29/17 1218 04/29/17 1251  GLUCAP 104* 101* 101* 55* 87   Lipid Profile: No results for input(s): CHOL, HDL, LDLCALC, TRIG, CHOLHDL, LDLDIRECT in the last 72 hours. Thyroid Function Tests: No results for input(s): TSH, T4TOTAL, FREET4, T3FREE, THYROIDAB in the last 72 hours. Anemia Panel: No results for input(s): VITAMINB12, FOLATE, FERRITIN, TIBC, IRON, RETICCTPCT in the last 72 hours. Urine analysis:    Component Value Date/Time   COLORURINE AMBER (A) 04/25/2017 0820   APPEARANCEUR HAZY (A) 04/25/2017 0820   LABSPEC 1.016 04/25/2017 0820   PHURINE 5.0 04/25/2017 0820   GLUCOSEU NEGATIVE 04/25/2017 0820   HGBUR LARGE (A) 04/25/2017 0820   BILIRUBINUR NEGATIVE 04/25/2017 0820   KETONESUR NEGATIVE 04/25/2017 0820   PROTEINUR NEGATIVE 04/25/2017 0820   NITRITE NEGATIVE 04/25/2017 0820   LEUKOCYTESUR TRACE (A) 04/25/2017 0820   Sepsis Labs: _1 (procalcitonin:4,lacticidven:4)  ) Recent Results (from the past 240 hour(s))  Culture, blood (routine x 2)     Status: None   Collection Time: 04/24/17 12:20 PM  Result Value Ref Range Status   Specimen Description BLOOD RIGHT FOREARM  Final   Special Requests IN PEDIATRIC BOTTLE Blood Culture adequate volume  Final   Culture NO GROWTH 5 DAYS  Final   Report Status 04/29/2017 FINAL  Final  Urine culture     Status: None   Collection Time: 04/24/17  1:00 PM    Result Value Ref Range Status   Specimen Description URINE, RANDOM  Final   Special Requests NONE  Final   Culture NO GROWTH  Final   Report Status 04/25/2017 FINAL  Final  Culture, blood (routine x 2)     Status: Abnormal   Collection Time: 04/24/17  1:25 PM  Result Value Ref Range Status   Specimen Description BLOOD PICC LINE  Final   Special Requests   Final    BOTTLES DRAWN AEROBIC AND ANAEROBIC Blood Culture adequate volume   Culture  Setup Time   Final    AEROBIC BOTTLE ONLY GRAM POSITIVE COCCI IN CLUSTERS CRITICAL RESULT CALLED TO, READ BACK BY AND VERIFIED WITH: JLEDFORD,PHARMD BY [facility] 04/26/17 AT 3:57AM    Culture (A)  Final    STAPHYLOCOCCUS SPECIES (COAGULASE NEGATIVE) THE SIGNIFICANCE OF ISOLATING THIS ORGANISM FROM A SINGLE SET OF BLOOD CULTURES WHEN MULTIPLE SETS ARE DRAWN IS UNCERTAIN. PLEASE NOTIFY THE MICROBIOLOGY DEPARTMENT WITHIN ONE WEEK IF SPECIATION AND SENSITIVITIES ARE REQUIRED.    Report Status 04/29/2017 FINAL  Final  Blood Culture ID Panel (Reflexed)     Status: Abnormal   Collection Time: 04/24/17  1:25 PM  Result Value Ref Range Status   Enterococcus species NOT DETECTED NOT DETECTED Final   Listeria monocytogenes NOT DETECTED NOT DETECTED Final   Staphylococcus species DETECTED (A) NOT DETECTED Final    Comment: Methicillin (oxacillin) resistant coagulase negative staphylococcus. Possible blood culture contaminant (unless isolated from more than one blood culture draw or clinical case suggests pathogenicity). No antibiotic treatment is indicated for blood  culture contaminants. CRITICAL RESULT CALLED TO, READ BACK BY AND VERIFIED WITH: TO JLEDFORD BY [facility] 04/26/17 AT 3:57AM    Staphylococcus aureus NOT DETECTED NOT DETECTED Final   Methicillin resistance DETECTED (A) NOT DETECTED Final    Comment: CRITICAL RESULT CALLED TO, READ BACK BY AND VERIFIED WITH: TO JLEDFORD BY [facility] 04/26/17 AT 3:57AM    Streptococcus species NOT DETECTED  NOT DETECTED Final   Streptococcus agalactiae NOT DETECTED NOT DETECTED Final   Streptococcus pneumoniae NOT DETECTED NOT DETECTED Final   Streptococcus pyogenes NOT DETECTED NOT DETECTED Final   Acinetobacter baumannii NOT DETECTED NOT DETECTED Final   Enterobacteriaceae species NOT DETECTED NOT DETECTED Final   Enterobacter cloacae complex NOT DETECTED NOT DETECTED Final   Escherichia coli NOT DETECTED NOT DETECTED Final   Klebsiella oxytoca NOT DETECTED NOT DETECTED Final   Klebsiella pneumoniae NOT DETECTED NOT DETECTED Final   Proteus species NOT DETECTED NOT DETECTED Final   Serratia marcescens NOT DETECTED NOT DETECTED Final   Haemophilus influenzae NOT DETECTED NOT DETECTED Final   Neisseria meningitidis NOT DETECTED NOT DETECTED Final   Pseudomonas aeruginosa NOT DETECTED NOT DETECTED Final   Candida albicans NOT DETECTED NOT DETECTED Final   Candida glabrata NOT DETECTED NOT DETECTED Final   Candida krusei NOT DETECTED NOT DETECTED Final   Candida parapsilosis NOT DETECTED NOT DETECTED Final   Candida tropicalis NOT DETECTED NOT DETECTED Final  MRSA PCR Screening     Status: None   Collection Time: 04/24/17  6:39 PM  Result Value Ref Range Status   MRSA by PCR NEGATIVE NEGATIVE Final    Comment:        The GeneXpert MRSA Assay (FDA approved for NASAL specimens only), is one component of a comprehensive MRSA colonization surveillance program. It is not intended to diagnose MRSA infection nor to guide or monitor treatment for MRSA infections.   C difficile quick scan w PCR reflex     Status: None   Collection Time: 04/25/17  8:28 AM  Result Value Ref Range Status   C Diff antigen NEGATIVE NEGATIVE Final   C Diff toxin NEGATIVE NEGATIVE Final   C Diff interpretation No C. difficile detected.  Final         Radiology Studies: No results found.      Scheduled Meds: .  dextrose  50 mL Intravenous Once  . ipratropium-albuterol  3 mL Nebulization TID  .  risperiDONE  0.5 mg Oral BID   Continuous Infusions: . dextrose 5 % and 0.45% NaCl 1,000 mL (04/30/17 0749)     LOS: 6 days    Time spent: 40 minutes    WOODS, Geraldo Docker, MD [facility] Pager [phone]   If 7PM-7AM, please contact night-coverage [email or web address] Password [facility] 04/30/2017, 8:44 AM

## 2017-04-30 NOTE — Progress Notes (Signed)
CSW received call from Amy at [facility]. [facility] will have bed available for patient tomorrow. Amy to coordinate with patient's partner to complete paperwork tomorrow. CSW to support with discharge.  Estanislado Emms, [place]

## 2017-04-30 NOTE — Progress Notes (Signed)
Palliative Medicine RN Note: Afternoon symptom check. Pt is resting, c/o cold. Rearranged her blankets and repositioned her; she now states she is comfortable. She states that she was supposed to go to a party today but that we kept her in the hospital, and she spent all day standing up waiting. She denies any discomfort, pain, nausea, SOB. Attempted to call SO [name] w update; no answer and no option to leave VM.  Marjie Skiff Bertha Earwood, RN, BSN, [facility] 04/30/2017 4:20 PM Office [phone]

## 2017-04-30 NOTE — Progress Notes (Signed)
Palliative Medicine RN Note: Symptom/status check. Pt is awake but confused. IV fluids were restarted by attending last night, but family understands that they will be stopped to allow a natural death. CBG was checked yesterday evening and fluids were restarted by Dr Denton Brick at that time.  Patient denies pain, SOB, nausea. Does not feel like eating. Voice is stronger today. Unsure if this is a surge. However, pt still has untreated infection and LGIB.   Spoke with [name] and SW Judson Roch. Discussed initiation of hospice referral for BP. I spoke with Amy w [facility] and gave update/referral information; she will start working on it from her end.   Plan for PMT to follow up again this afternoon.  Marjie Skiff Jaclyn Carew, RN, BSN, [facility] 04/30/2017 10:42 AM Office [phone]

## 2017-04-30 NOTE — Care Management Important Message (Signed)
Important Message  Patient Details  Name: Angel Estrada MRN: [id number] Date of Birth: 1933-05-26   Medicare Important Message Given:  Yes    Jasai Sorg [place] 04/30/2017, 12:10 PM

## 2017-04-30 NOTE — Progress Notes (Signed)
Patient resting comfortable in her bed; she refused to be repositioning saying that she is comfortable like this. Staff offered warm blanket and she said she is feeling good. Will continue to monitor.

## 2017-04-30 NOTE — Progress Notes (Signed)
[facility] Liaison:  RN visit  Received request from Manuela Schwartz, [place], for family interest in [facility].  Also, Melanie with PMT.  Chart reviewed and spoke with family to acknowledge referral.  Unfortunately, [facility] is not able to offer a room today.  [name], significant other, and Manuela Schwartz, CSW, are aware [facility] liaison will follow up with both tomorrow or sooner if room becomes available.  Please do not hesitate to call with questions.  I did go visit with patient at bedside and she was very confused.    Thank you for this referral.  Edyth Gunnels, RN, [facility] Liaison [phone]  All hospital liaisons are now on [place].

## 2017-04-30 NOTE — Clinical Social Work Note (Signed)
Clinical Social Work Assessment  Patient Details  Name: Angel Estrada MRN: [id number] Date of Birth: Jun 26, 1933  Date of referral:  04/30/17               Reason for consult:  Facility Placement, End of Life/Hospice                Permission sought to share information with:  Facility Sport and exercise psychologist, Family Supports Permission granted to share information::     Name::     [name]  Agency::  [facility]  Relationship::  significant other  Contact Information:  [phone]  Housing/Transportation Living arrangements for the past 2 months:  [place] of Information:  Partner Patient Interpreter Needed:  None Criminal Activity/Legal Involvement Pertinent to Current Situation/Hospitalization:  No - Comment as needed Significant Relationships:  Significant Other Lives with:  Significant Other Do you feel safe going back to the place where you live?  No(end of life care; partner unable to care for pt in home) Need for family participation in patient care:  Yes (Comment)  Care giving concerns: Patient from home with partner. Patient is now comfort care, plan for residential hospice.   Social Worker assessment / plan: CSW spoke to patient's partner, [name], via phone. [name] agreeable to residential hospice placement and prefers [facility]. [name] is unable to travel to other hospice facilities as she does not drive and cannot afford transportation to [place] or [place]. CSW made referral to [facility]. CSW to follow and support with residential hospice placement.  Employment status:  Retired Research officer, political party) PT Recommendations:  Not assessed at this time Information / Referral to community resources:  Other (Comment Required)(Residential hospice)  Patient/Family's Response to care: Partner appreciative of care.  Patient/Family's Understanding of and Emotional Response to Diagnosis, Current Treatment, and Prognosis:  Partner with understanding of patient's condition and accepting of prognosis. Partner agreeable to residential hospice placement.  Emotional Assessment Appearance:  Appears stated age Attitude/Demeanor/Rapport:  Unable to Assess Affect (typically observed):  Unable to Assess Orientation:  Oriented to Self, Oriented to Place, Oriented to Situation Alcohol / Substance use:  Not Applicable Psych involvement (Current and /or in the community):  No (Comment)  Discharge Needs  Concerns to be addressed:  Discharge Planning Concerns, Care Coordination Readmission within the last 30 days:  Yes Current discharge risk:  Terminally ill Barriers to Discharge:  Continued Medical Work up, Hospice Bed not available   Estanislado Emms, LCSW 04/30/2017, 11:10 AM

## 2017-05-01 DIAGNOSIS — I10 Essential (primary) hypertension: Secondary | ICD-10-CM

## 2017-05-01 DIAGNOSIS — A419 Sepsis, unspecified organism: Secondary | ICD-10-CM

## 2017-05-01 DIAGNOSIS — I824Z2 Acute embolism and thrombosis of unspecified deep veins of left distal lower extremity: Secondary | ICD-10-CM

## 2017-05-01 DIAGNOSIS — R6520 Severe sepsis without septic shock: Secondary | ICD-10-CM

## 2017-05-01 DIAGNOSIS — E118 Type 2 diabetes mellitus with unspecified complications: Secondary | ICD-10-CM

## 2017-05-01 DIAGNOSIS — R338 Other retention of urine: Secondary | ICD-10-CM

## 2017-05-01 MED ORDER — GLYCOPYRROLATE 1 MG PO TABS: 1.0000 mg | ORAL_TABLET | ORAL | 0 refills | Status: AC | PRN

## 2017-05-01 MED ORDER — TEMAZEPAM 15 MG PO CAPS: 15.0000 mg | ORAL_CAPSULE | Freq: Every evening | ORAL | 0 refills | Status: AC | PRN

## 2017-05-01 MED ORDER — MORPHINE SULFATE (CONCENTRATE) 10 MG/0.5ML PO SOLN: 10.0000 mg | ORAL | 0 refills | Status: AC | PRN

## 2017-05-01 MED ORDER — BISACODYL 10 MG RE SUPP: 10.0000 mg | Freq: Every day | RECTAL | 0 refills | Status: AC | PRN

## 2017-05-01 MED ORDER — LORAZEPAM 1 MG PO TABS: 1.0000 mg | ORAL_TABLET | ORAL | 0 refills | Status: AC | PRN

## 2017-05-01 MED ORDER — POLYVINYL ALCOHOL 1.4 % OP SOLN: 1.0000 [drp] | Freq: Four times a day (QID) | OPHTHALMIC | 0 refills | Status: AC | PRN

## 2017-05-01 MED ORDER — IPRATROPIUM-ALBUTEROL 0.5-2.5 (3) MG/3ML IN SOLN: 3.0000 mL | Freq: Three times a day (TID) | RESPIRATORY_TRACT | 0 refills | Status: AC

## 2017-05-01 MED ORDER — ALUM & MAG HYDROXIDE-SIMETH 200-200-20 MG/5ML PO SUSP: 30.0000 mL | Freq: Four times a day (QID) | ORAL | 0 refills | Status: AC | PRN

## 2017-05-01 MED ORDER — SENNA 8.6 MG PO TABS: 1.0000 | ORAL_TABLET | Freq: Every evening | ORAL | 0 refills | Status: AC | PRN

## 2017-05-01 MED ORDER — HEPARIN SOD (PORK) LOCK FLUSH 100 UNIT/ML IV SOLN: 250.0000 [IU] | INTRAVENOUS | Status: DC | PRN

## 2017-05-01 MED ORDER — RISPERIDONE 0.5 MG PO TBDP: 0.5000 mg | ORAL_TABLET | Freq: Two times a day (BID) | ORAL | 0 refills | Status: AC

## 2017-05-01 MED ORDER — NYSTATIN 100000 UNIT/GM EX POWD: Freq: Three times a day (TID) | CUTANEOUS | 0 refills | Status: AC | PRN

## 2017-05-01 MED ORDER — ONDANSETRON 4 MG PO TBDP: 4.0000 mg | ORAL_TABLET | Freq: Four times a day (QID) | ORAL | 0 refills | Status: AC | PRN

## 2017-05-01 MED ORDER — MAGIC MOUTHWASH W/LIDOCAINE: 15.0000 mL | Freq: Four times a day (QID) | ORAL | 0 refills | Status: AC | PRN

## 2017-05-01 NOTE — Progress Notes (Signed)
A vase of life flower was delivered for Angel Estrada, Transporter would not take life flower in the ambulance with them. Called patner to pick up the flower but the phone rang with no response.  Will pass the message across  to the the night charge nurse.

## 2017-05-01 NOTE — Progress Notes (Signed)
[name] to be D/C'd Skilled nursing facility per MD order.  Discussed with the patient about going to [place] home, although she was a little confused, she understood about her transfer to another facility. Report was to [facility]. An After Visit Summary was printed and given to PTAR, the transporter to give to nurse at the [facility].   D/c education completed with  including follow up instructions, medication list, d/c activities limitations if indicated, with other d/c instructions as indicated by MD, and given to PTAR. Patient escorted via stretcher, and transferred to hospice via ambulance.  Dorris Carnes 05/01/2017 4:53 PM

## 2017-05-01 NOTE — Discharge Summary (Signed)
Physician Discharge Summary  Angel Estrada KPT:[id number] DOB: 1934-01-22 DOA: 04/24/2017  PCP: Jani Gravel, MD  Admit date: 04/24/2017 Discharge date: 05/01/2017  Time spent: 35 minutes  Recommendations for Outpatient Follow-up:  Severe Sepsis Unclear etiology -Upon admission meets criteria for sepsis HR> 90, RR> 20, WBC> 12. In addition patient positive lactic acidosis, hypotension. -Initially treated with fluids and stress dose steroids with minimal effect on BP, therefore discontinued. -Antibiotics initially started per ID recommendations for patients LLE cellulitis see picture below. After family conference with patient and family decided to make patient comfort care.  -Patient is Oxly will be discharged to [facility]     Hypotension -Patient is Herminie will be discharged to [facility]   Chronic Diastolic CHF -Strict in and out since admission +6.4 L -Daily weight Filed Weights   04/28/17 0500 04/29/17 0540 04/30/17 0528  Weight: 133 lb 13.1 oz (60.7 kg) 139 lb 12.4 oz (63.4 kg) 145 lb 8.1 oz (66 kg)  Patient is Comfort Care will be discharged to [facility]  New-onset atrial fibrillation(CHA2DS2 VASc score is 4) -Patient comfort care medications only for maintaining patient's comfort.    HTN -See CHF   Pulmonary HTN -See CHF    LEFT LEG cellulitis (discharged on 17/0) -Complicated wound with insect infestation contaminated by CAT hair -Per discharge summary on 12/8 patient underwent debridement by [facility], was to continue wound VAC and follow-up in one week : Was seen by ID and instructed to continue Rocephin/Flagyl for 6 weeks with end date 05/14/17    -Forwarded photo to  Dr. Marla Roe plastic surgery and discussed case. Recommendations: KY gel to area daily with Kerlix and Ace wrap -See goals of care     COPD -See goals of care     Extensive L LE DVT -See goals of care   Acute Kidney Injury (baseline Cr  0.73) -See goals of care   Acute urinary retention -Maintain Foley for patient's comfort   Anemia -See goals of care    Diabetes type 2 controlled with complication -01/7 Hemoglobin A1c = 6.1 -Sensitive SSI   Goals of care -Per EMR on 12/26/2018Family conference with patient and patient's significant other of 51 years (Ms [name] at [phone]) on speakerphone.  Patient's significant other spoke with patient brother and sister-in-law.  At this time patient, her significant other of 51 yrs, her brother and her sister-in-law have all decided to withdraw active/ aggressive treatment and initiate comfort/palliative care.  Patient remains a DNR/DNI with limitations of treatment and comfort measures only   Patient is COMFORT CARE.  Will be discharged to [facility]      Discharge Diagnoses:  Principal Problem:   Sepsis affecting skin ([place]) Active Problems:   New onset atrial fibrillation ([place])   Diabetes mellitus without complication ([place])   Acute kidney injury ([place])   HTN (hypertension)   Pulmonary hypertension due to COPD ([place])   Acute urinary retention   Dementia (suspected)   Leg edema, left   Discharge Condition: Guarded  Diet recommendation: Clear liquid diet  Filed Weights   04/28/17 0500 04/29/17 0540 04/30/17 0528  Weight: 133 lb 13.1 oz (60.7 kg) 139 lb 12.4 oz (63.4 kg) 145 lb 8.1 oz (66 kg)    History of present illness:  83yo F WF from [facility] PMHx DVT    COPD with pulmonary hypertension, grade 1 diastolic dysfunction, dementia, and DM who was discharged to a SNF after an admission for a  L LE cellulitis with sepsis, and returned to the ED this admit with AMS following several days of very poor intake.  During prior hospitalization the patient underwent surgical debridement and wound VAC placement.  ID was following during the hospitalization and recommended 6 weeks of IV Rocephin with Flagyl with last dose 05/14/17.  Patient was discharged with a  PICC line in place in the right upper extremity.  Upon presentation this admit pt was found to have a blood pressure of 80/68, pulse 125, and new onset atrial fibrillation.  Labs revealed mild acute kidney injury, lactate of 4.98, white count 29,900.  Patient was noted to have acute urinary retention and a Foley catheter was placed with about 2 L returned.    During his hospitalization patient was diagnosed with severe sepsis unclear etiology most likely secondary to her severe LLE cellulitis. Started initially on antibiotics per ID and care per plastic surgery recommendations. After several days patient and family met to discuss goals of care, at which point was decided to withdraw all aggressive care and make patient comfort care.    Procedures: 12/22 Doppler LEFT LLE: Results pending    Consultations: Phone consult Plastic Surgery    Cultures  12/21 blood 1/2 positive coag negative staph (most likely contaminant)   12/21 urine negative    Antibiotics Anti-infectives (From admission, onward)   Start     Stop   04/28/17 1500  vancomycin (VANCOCIN) 500 mg in sodium chloride 0.9 % 100 mL IVPB  Status:  Discontinued     04/29/17 1321   04/25/17 1500  vancomycin (VANCOCIN) IVPB 750 mg/150 ml premix  Status:  Discontinued     04/28/17 1417   04/24/17 1400  meropenem (MERREM) 1 g in sodium chloride 0.9 % 100 mL IVPB  Status:  Discontinued     04/29/17 1321   04/24/17 1400  vancomycin (VANCOCIN) IVPB 1000 mg/200 mL premix     04/24/17 1602       Discharge Exam: Vitals:   04/30/17 0528 04/30/17 1434 04/30/17 2051 05/01/17 0717  BP:  (!) 86/52 97/63   Pulse:  (!) 57 87   Resp:  15 18   Temp:  97.6 F (36.4 C) (!) 97.4 F (36.3 C)   TempSrc:      SpO2:  98% 96% (!) 87%  Weight: 145 lb 8.1 oz (66 kg)     Height:        General:  A/O 4  No acute respiratory distress Neck:  Negative scars, masses, torticollis, lymphadenopathy, JVD Lungs: Clear to auscultation bilaterally  without wheezes or crackles Cardiovascular: Irregular irregular rhythm and rate, without murmur gallop or rub normal S1 and S2    Discharge Instructions   Allergies as of 05/01/2017      Reactions   Penicillins Swelling, Rash   Site of swelling (?) Has patient had a PCN reaction causing immediate rash, facial/tongue/throat swelling, SOB or lightheadedness with hypotension: Yes Has patient had a PCN reaction causing severe rash involving mucus membranes or skin necrosis: No Has patient had a PCN reaction that required hospitalization: No Has patient had a PCN reaction occurring within the last 10 years: No If all of the above answers are "NO", then may proceed with Cephalosporin use.   Lisinopril-hydrochlorothiazide Other (See Comments), Cough   Pt's PCP took her off medication because of possible kidney damage   Sulfa Antibiotics Other (See Comments)   Unknown reaction-- childhood allergy    Latex Rash  Medication List    STOP taking these medications   alendronate 35 MG tablet Commonly known as:  FOSAMAX   allopurinol 100 MG tablet Commonly known as:  ZYLOPRIM   cefTRIAXone IVPB Commonly known as:  ROCEPHIN   furosemide 40 MG tablet Commonly known as:  LASIX   insulin aspart 100 UNIT/ML injection Commonly known as:  novoLOG   metroNIDAZOLE 500 MG tablet Commonly known as:  FLAGYL   multivitamin with minerals Tabs tablet   nystatin-triamcinolone cream Commonly known as:  MYCOLOG II   sildenafil 20 MG tablet Commonly known as:  REVATIO   SYSTANE ULTRA PF 0.4-0.3 % Soln Generic drug:  Polyethyl Glyc-Propyl Glyc PF     TAKE these medications   acetaminophen 325 MG tablet Commonly known as:  TYLENOL Take 2 tablets (650 mg total) by mouth every 6 (six) hours as needed for mild pain (or Fever >/= 101).   alum & mag hydroxide-simeth 200-200-20 MG/5ML suspension Commonly known as:  MAALOX/MYLANTA Take 30 mLs by mouth every 6 (six) hours as needed for  indigestion or heartburn (dyspepsia).   bisacodyl 10 MG suppository Commonly known as:  DULCOLAX Place 1 suppository (10 mg total) rectally daily as needed for moderate constipation. What changed:  when to take this   budesonide-formoterol 160-4.5 MCG/ACT inhaler Commonly known as:  SYMBICORT Inhale 2 puffs into the lungs 2 (two) times daily.   feeding supplement (ENSURE ENLIVE) Liqd Take 237 mLs by mouth 2 (two) times daily between meals.   fluticasone 50 MCG/ACT nasal spray Commonly known as:  FLONASE Place 2 sprays into both nostrils daily as needed for allergies or rhinitis.   glycopyrrolate 1 MG tablet Commonly known as:  ROBINUL Take 1 tablet (1 mg total) by mouth every 4 (four) hours as needed (excessive secretions).   ipratropium-albuterol 0.5-2.5 (3) MG/3ML Soln Commonly known as:  DUONEB Take 3 mLs by nebulization 3 (three) times daily.   LORazepam 1 MG tablet Commonly known as:  ATIVAN Take 1 tablet (1 mg total) by mouth every 4 (four) hours as needed for anxiety (shortness of breath).   magic mouthwash w/lidocaine Soln Take 15 mLs by mouth every 6 (six) hours as needed for mouth pain (mouth pain / discomfort).   magnesium hydroxide 400 MG/5ML suspension Commonly known as:  MILK OF MAGNESIA Take 30 mLs by mouth daily as needed for mild constipation.   morphine CONCENTRATE 10 MG/0.5ML Soln concentrated solution Place 0.5 mLs (10 mg total) under the tongue every hour as needed for moderate pain (or dyspnea).   nystatin powder Commonly known as:  MYCOSTATIN/NYSTOP Apply topically 3 (three) times daily as needed (affected skin).   ondansetron 4 MG disintegrating tablet Commonly known as:  ZOFRAN-ODT Take 1 tablet (4 mg total) by mouth every 6 (six) hours as needed for nausea.   polyvinyl alcohol 1.4 % ophthalmic solution Commonly known as:  LIQUIFILM TEARS Place 1 drop into both eyes 4 (four) times daily as needed for dry eyes.   PROAIR HFA 108 (90 Base)  MCG/ACT inhaler Generic drug:  albuterol Inhale 2 puffs into the lungs every 6 (six) hours as needed for wheezing or shortness of breath.   risperiDONE 0.5 MG disintegrating tablet Commonly known as:  RISPERDAL M-TABS Take 1 tablet (0.5 mg total) by mouth 2 (two) times daily.   SALONPAS PAIN RELIEF PATCH EX Apply 1 patch topically daily as needed (PAIN).   senna 8.6 MG Tabs tablet Commonly known as:  SENOKOT Take 1 tablet (8.6  mg total) by mouth at bedtime as needed for mild constipation.   temazepam 15 MG capsule Commonly known as:  RESTORIL Take 1 capsule (15 mg total) by mouth at bedtime as needed for sleep.      Allergies  Allergen Reactions  . Penicillins Swelling and Rash    Site of swelling (?) Has patient had a PCN reaction causing immediate rash, facial/tongue/throat swelling, SOB or lightheadedness with hypotension: Yes Has patient had a PCN reaction causing severe rash involving mucus membranes or skin necrosis: No Has patient had a PCN reaction that required hospitalization: No Has patient had a PCN reaction occurring within the last 10 years: No If all of the above answers are "NO", then may proceed with Cephalosporin use.   Marland Kitchen Lisinopril-Hydrochlorothiazide Other (See Comments) and Cough    Pt's PCP took her off medication because of possible kidney damage  . Sulfa Antibiotics Other (See Comments)    Unknown reaction-- childhood allergy   . Latex Rash      The results of significant diagnostics from this hospitalization (including imaging, microbiology, ancillary and laboratory) are listed below for reference.    Significant Diagnostic Studies: Dg Tibia/fibula Left  Result Date: 04/01/2017 CLINICAL DATA:  Large open wound at mid lower leg possible infection, history diabetes mellitus, hypertension, CHF EXAM: LEFT TIBIA AND FIBULA - 2 VIEW COMPARISON:  03/25/2017 FINDINGS: Osseous demineralization. Hip and knee joint spaces preserved. No acute fracture,  dislocation or bone destruction. Scattered soft tissue swelling LEFT lower leg with large area of soft tissue irregularity and ulceration at the anterior aspect of the mid lower leg. No definite associated radiopaque foreign bodies or underlying osseous changes. IMPRESSION: Soft tissue changes without acute osseous abnormalities. Electronically Signed   By: Lavonia Dana M.D.   On: 04/01/2017 13:07   Ct Head Wo Contrast  Result Date: 04/05/2017 CLINICAL DATA:  Sudden onset of confusion and neck pain EXAM: CT HEAD WITHOUT CONTRAST CT CERVICAL SPINE WITHOUT CONTRAST TECHNIQUE: Multidetector CT imaging of the head and cervical spine was performed following the standard protocol without intravenous contrast. Multiplanar CT image reconstructions of the cervical spine were also generated. COMPARISON:  None. FINDINGS: CT HEAD FINDINGS Brain: No acute territorial infarction, hemorrhage, on or intracranial mass is visualized. Moderate small vessel ischemic changes of the white matter. Moderate atrophy. Nonenlarged ventricles. Vascular: No hyperdense vessels. Vertebral artery and carotid artery calcification Skull: No fracture.  Small osteoma frontal bone. Sinuses/Orbits: No acute abnormality. Other: None CT CERVICAL SPINE FINDINGS Alignment: Trace anterolisthesis of C3 on C4 and C4 on C5. Straightening of the cervical spine. Facet alignment within normal limits Skull base and vertebrae: No acute fracture. No primary bone lesion or focal pathologic process. Soft tissues and spinal canal: No prevertebral fluid or swelling. No visible canal hematoma. Disc levels: Advanced arthritis at C5-C6. Moderate severe degenerative changes at C4-C5 and C6-C7. Upper chest: Apical emphysema. Probable subcentimeter hypodense nodule in the right lobe of thyroid Other: None IMPRESSION: 1. No CT evidence for acute intracranial abnormality. Atrophy and small vessel ischemic changes of the white matter 2. Trace anterolisthesis of C3 on C4.  Degenerative changes most marked at C5-C6. No acute osseous abnormality 3. Apical emphysema Electronically Signed   By: Donavan Foil M.D.   On: 04/05/2017 02:35   Ct Angio Chest Pe W Or Wo Contrast  Result Date: 04/08/2017 CLINICAL DATA:  Shortness of breath, orthopnea, back pain, recent surgery for leg wound, elevated D-dimer question pulmonary embolism, history diabetes mellitus, hypertension,  CHF, COPD EXAM: CT ANGIOGRAPHY CHEST WITH CONTRAST TECHNIQUE: Multidetector CT imaging of the chest was performed using the standard protocol during bolus administration of intravenous contrast. Multiplanar CT image reconstructions and MIPs were obtained to evaluate the vascular anatomy. CONTRAST:  42m ISOVUE-370 IOPAMIDOL (ISOVUE-370) INJECTION 76% IV COMPARISON:  Noncontrast CT chest 02/21/2016 FINDINGS: Cardiovascular: Atherosclerotic calcifications aorta, proximal great vessels, and coronary arteries. Markedly tortuous innominate artery. Aorta normal caliber without aneurysm or dissection. Minimally dilated central pulmonary arteries. Pulmonary arteries well opacified and patent. No evidence of pulmonary embolism. No pericardial effusion. Mediastinum/Nodes: Chronic elevation of LEFT diaphragm. Esophagus unremarkable. 9 mm RIGHT thyroid nodule. Few normal sized inferior cervical lymph nodes bilaterally. No intrathoracic adenopathy. Lungs/Pleura: LEFT lower lobe atelectasis. Small LEFT pleural effusion. Minimal atelectasis at RIGHT lung base. No definite infiltrate or pneumothorax. Upper Abdomen: Question gastric wall thickening versus artifact from underdistention. Remaining visualized upper abdomen unremarkable. Musculoskeletal: Bones demineralized with dextroconvex thoracic scoliosis. Review of the MIP images confirms the above findings. IMPRESSION: No evidence of  pulmonary embolism. Enlargement of central pulmonary arteries question pulmonary artery hypertension. Chronic elevation of LEFT diaphragm with small  LEFT pleural effusion and complete LEFT lower lobe atelectasis. Questionable gastric wall thickening versus artifact underdistention. Extensive atherosclerotic calcifications. Aortic Atherosclerosis (ICD10-I70.0). Electronically Signed   By: MLavonia DanaM.D.   On: 04/08/2017 17:59   Ct Cervical Spine Wo Contrast  Result Date: 04/05/2017 CLINICAL DATA:  Sudden onset of confusion and neck pain EXAM: CT HEAD WITHOUT CONTRAST CT CERVICAL SPINE WITHOUT CONTRAST TECHNIQUE: Multidetector CT imaging of the head and cervical spine was performed following the standard protocol without intravenous contrast. Multiplanar CT image reconstructions of the cervical spine were also generated. COMPARISON:  None. FINDINGS: CT HEAD FINDINGS Brain: No acute territorial infarction, hemorrhage, on or intracranial mass is visualized. Moderate small vessel ischemic changes of the white matter. Moderate atrophy. Nonenlarged ventricles. Vascular: No hyperdense vessels. Vertebral artery and carotid artery calcification Skull: No fracture.  Small osteoma frontal bone. Sinuses/Orbits: No acute abnormality. Other: None CT CERVICAL SPINE FINDINGS Alignment: Trace anterolisthesis of C3 on C4 and C4 on C5. Straightening of the cervical spine. Facet alignment within normal limits Skull base and vertebrae: No acute fracture. No primary bone lesion or focal pathologic process. Soft tissues and spinal canal: No prevertebral fluid or swelling. No visible canal hematoma. Disc levels: Advanced arthritis at C5-C6. Moderate severe degenerative changes at C4-C5 and C6-C7. Upper chest: Apical emphysema. Probable subcentimeter hypodense nodule in the right lobe of thyroid Other: None IMPRESSION: 1. No CT evidence for acute intracranial abnormality. Atrophy and small vessel ischemic changes of the white matter 2. Trace anterolisthesis of C3 on C4. Degenerative changes most marked at C5-C6. No acute osseous abnormality 3. Apical emphysema Electronically Signed    By: KDonavan FoilM.D.   On: 04/05/2017 02:35   Dg Chest Portable 1 View  Result Date: 04/24/2017 CLINICAL DATA:  Altered mental status. EXAM: PORTABLE CHEST 1 VIEW COMPARISON:  Chest x-ray 04/11/2017.  CT 04/08/2017 . FINDINGS: Right PICC line in stable position. Mediastinum hilar structures are normal. Heart size stable. Stable elevation left hemidiaphragm with stable left basilar atelectasis. Stable mild right base atelectasis. Thoracolumbar spine scoliosis. Air-filled scratched it air-containing structures over the left upper abdomen most likely represent stomach and colon. reference is made to prior CT report of 04/08/2017 . IMPRESSION: 1. Right PICC line stable position. 2. Stable elevation left hemidiaphragm with stable atelectasis left base. Stable mild right base subsegmental atelectasis. Electronically Signed  By: [place]   On: 04/24/2017 11:46   Dg Chest Port 1 View  Result Date: 04/11/2017 CLINICAL DATA:  Line placement EXAM: PORTABLE CHEST 1 VIEW COMPARISON:  Portable exam 1126 hours compared to 04/08/2017 FINDINGS: RIGHT arm PICC line tip projects over high RIGHT atrium; consider withdrawal 3 cm. Marked elevation of LEFT diaphragm versus diaphragmatic hernia. Heart size obscured. Significant atelectasis at LEFT base with minimal RIGHT basilar atelectasis. Tiny probable pleural effusions. No pneumothorax. Bones demineralized. IMPRESSION: Tip of RIGHT arm PICC line projects over RIGHT atrium, recommend withdrawal 3.0 cm. Significant elevation of LEFT diaphragm versus diaphragmatic hernia with LEFT basilar atelectasis and tiny BILATERAL pleural effusions. Findings called to [place] on 5N on 04/11/2017 at 1200 hours. Electronically Signed   By: Lavonia Dana M.D.   On: 04/11/2017 12:00   Dg Chest Port 1 View  Result Date: 04/08/2017 CLINICAL DATA:  Worsening oxygen saturation. EXAM: PORTABLE CHEST 1 VIEW COMPARISON:  04/01/2017.  07/30/2015. FINDINGS: Large chronic diaphragmatic  hernia on the left containing multiple loops of intestine with left lower lung volume loss. Right lung is clear except for mild chronic increased markings. No sign of heart failure or effusion. No acute bone finding. 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 Chronic volume loss in the left lower lung secondary to that. No acute finding identified otherwise. Electronically Signed   By: Nelson Chimes M.D.   On: 04/08/2017 15:54   Dg Chest Portable 1 View  Result Date: 04/01/2017 CLINICAL DATA:  Large open wound at mid LEFT lower leg EXAM: PORTABLE CHEST 1 VIEW COMPARISON:  Portable exam 1242 hours compared to 07/30/2015 FINDINGS: Stable heart size. Large LEFT diaphragmatic versus hiatal hernia again identified. Mediastinal contours and pulmonary vascularity otherwise normal. Chronic lung changes at the bases including LEFT basilar atelectasis and scarring at minor fissure. No definite infiltrate, pleural effusion or pneumothorax. Bones demineralized. Scattered atherosclerotic calcifications aorta. IMPRESSION: Large LEFT diaphragmatic versus hiatal hernia. Bronchitic changes with chronic LEFT basilar atelectasis and scarring at minor fissure. No acute infiltrate. Electronically Signed   By: Lavonia Dana M.D.   On: 04/01/2017 13:09    Microbiology: Recent Results (from the past 240 hour(s))  Culture, blood (routine x 2)     Status: None   Collection Time: 04/24/17 12:20 PM  Result Value Ref Range Status   Specimen Description BLOOD RIGHT FOREARM  Final   Special Requests IN PEDIATRIC BOTTLE Blood Culture adequate volume  Final   Culture NO GROWTH 5 DAYS  Final   Report Status 04/29/2017 FINAL  Final  Urine culture     Status: None   Collection Time: 04/24/17  1:00 PM  Result Value Ref Range Status   Specimen Description URINE, RANDOM  Final   Special Requests NONE  Final   Culture NO GROWTH  Final   Report Status 04/25/2017 FINAL  Final   Culture, blood (routine x 2)     Status: Abnormal   Collection Time: 04/24/17  1:25 PM  Result Value Ref Range Status   Specimen Description BLOOD PICC LINE  Final   Special Requests   Final    BOTTLES DRAWN AEROBIC AND ANAEROBIC Blood Culture adequate volume   Culture  Setup Time   Final    AEROBIC BOTTLE ONLY GRAM POSITIVE COCCI IN CLUSTERS CRITICAL RESULT CALLED TO, READ BACK BY AND VERIFIED WITH: JLEDFORD,PHARMD BY [facility] 04/26/17 AT 3:57AM    Culture (A)  Final  STAPHYLOCOCCUS SPECIES (COAGULASE NEGATIVE) THE SIGNIFICANCE OF ISOLATING THIS ORGANISM FROM A SINGLE SET OF BLOOD CULTURES WHEN MULTIPLE SETS ARE DRAWN IS UNCERTAIN. PLEASE NOTIFY THE MICROBIOLOGY DEPARTMENT WITHIN ONE WEEK IF SPECIATION AND SENSITIVITIES ARE REQUIRED.    Report Status 04/29/2017 FINAL  Final  Blood Culture ID Panel (Reflexed)     Status: Abnormal   Collection Time: 04/24/17  1:25 PM  Result Value Ref Range Status   Enterococcus species NOT DETECTED NOT DETECTED Final   Listeria monocytogenes NOT DETECTED NOT DETECTED Final   Staphylococcus species DETECTED (A) NOT DETECTED Final    Comment: Methicillin (oxacillin) resistant coagulase negative staphylococcus. Possible blood culture contaminant (unless isolated from more than one blood culture draw or clinical case suggests pathogenicity). No antibiotic treatment is indicated for blood  culture contaminants. CRITICAL RESULT CALLED TO, READ BACK BY AND VERIFIED WITH: TO JLEDFORD BY [facility] 04/26/17 AT 3:57AM    Staphylococcus aureus NOT DETECTED NOT DETECTED Final   Methicillin resistance DETECTED (A) NOT DETECTED Final    Comment: CRITICAL RESULT CALLED TO, READ BACK BY AND VERIFIED WITH: TO JLEDFORD BY [place] 04/26/17 AT 3:57AM    Streptococcus species NOT DETECTED NOT DETECTED Final   Streptococcus agalactiae NOT DETECTED NOT DETECTED Final   Streptococcus pneumoniae NOT DETECTED NOT DETECTED Final   Streptococcus pyogenes NOT DETECTED  NOT DETECTED Final   Acinetobacter baumannii NOT DETECTED NOT DETECTED Final   Enterobacteriaceae species NOT DETECTED NOT DETECTED Final   Enterobacter cloacae complex NOT DETECTED NOT DETECTED Final   Escherichia coli NOT DETECTED NOT DETECTED Final   Klebsiella oxytoca NOT DETECTED NOT DETECTED Final   Klebsiella pneumoniae NOT DETECTED NOT DETECTED Final   Proteus species NOT DETECTED NOT DETECTED Final   Serratia marcescens NOT DETECTED NOT DETECTED Final   Haemophilus influenzae NOT DETECTED NOT DETECTED Final   Neisseria meningitidis NOT DETECTED NOT DETECTED Final   Pseudomonas aeruginosa NOT DETECTED NOT DETECTED Final   Candida albicans NOT DETECTED NOT DETECTED Final   Candida glabrata NOT DETECTED NOT DETECTED Final   Candida krusei NOT DETECTED NOT DETECTED Final   Candida parapsilosis NOT DETECTED NOT DETECTED Final   Candida tropicalis NOT DETECTED NOT DETECTED Final  MRSA PCR Screening     Status: None   Collection Time: 04/24/17  6:39 PM  Result Value Ref Range Status   MRSA by PCR NEGATIVE NEGATIVE Final    Comment:        The GeneXpert MRSA Assay (FDA approved for NASAL specimens only), is one component of a comprehensive MRSA colonization surveillance program. It is not intended to diagnose MRSA infection nor to guide or monitor treatment for MRSA infections.   C difficile quick scan w PCR reflex     Status: None   Collection Time: 04/25/17  8:28 AM  Result Value Ref Range Status   C Diff antigen NEGATIVE NEGATIVE Final   C Diff toxin NEGATIVE NEGATIVE Final   C Diff interpretation No C. difficile detected.  Final     Labs: Basic Metabolic Panel: Recent Labs  Lab 04/25/17 0750 04/26/17 0342 04/26/17 0957 04/27/17 0458 04/28/17 0507 04/29/17 0323  NA 138 141  --  142 142 142  K 4.2 3.6  --  2.9* 4.6 4.2  CL 97* 101  --  102 106 107  CO2 26 32  --  33* 33* 32  GLUCOSE 86 65  --  110* 150* 115*  BUN 57* 52*  --  38* 35* 27*  CREATININE  1.23*  1.01*  --  0.69 0.70 0.51  CALCIUM 7.1* 7.6*  --  8.5* 8.7* 8.6*  MG  --   --  2.1 1.8 1.7 1.6*   Liver Function Tests: Recent Labs  Lab 04/24/17 1220 04/25/17 0750 04/26/17 0342 04/27/17 0458  AST _0 ALT 9* 12* 12* 13*  ALKPHOS 134* 177* 170* 183*  BILITOT 0.6 0.9 0.9 1.0  PROT 3.4* 4.4* 4.5* 4.7*  ALBUMIN 1.2* 1.7* 1.6* 2.7*   No results for input(s): LIPASE, AMYLASE in the last 168 hours. No results for input(s): AMMONIA in the last 168 hours. CBC: Recent Labs  Lab 04/26/17 0342 04/27/17 0458 04/27/17 2028 04/28/17 0507 04/29/17 0323  WBC 24.4* 17.3* 16.1* 12.7* 15.8*  HGB 11.4* 9.1* 8.9* 8.3* 9.9*  HCT 35.1* 29.1* 28.9* 26.1* 30.4*  MCV 97.2 98.6 101.0* 101.2* 100.7*  PLT 356 300 262 224 266   Cardiac Enzymes: Recent Labs  Lab 04/24/17 1654  CKTOTAL 37*   BNP: BNP (last 3 results) Recent Labs    04/01/17 1239 04/08/17 1536  BNP 91.6 409.5*    ProBNP (last 3 results) No results for input(s): PROBNP in the last 8760 hours.  CBG: Recent Labs  Lab 04/28/17 2347 04/29/17 0342 04/29/17 0802 04/29/17 1218 04/29/17 1251  GLUCAP 104* 101* 101* 55* 87       Signed:  Dia Crawford, MD [facility] [phone] pager

## 2017-05-01 NOTE — Progress Notes (Signed)
Offered to  give Morphine, but patient refused, stated she did not want any narcotic med.  Patient is comfortable at this time, will continue to monitor.

## 2017-05-01 NOTE — Progress Notes (Signed)
[facility] Liaison:  RN visit  Received request from Manuela Schwartz, [place], for family interest in [facility] with request for transfer as soon as possible.  Chart reviewed.  Met with patient yesterday at bedside and spoke with family in person at the home to confirm interest and explain services.  Family agreeable to transfer today.  Shelton Silvas, [place], aware.  Registration paperwork completed today at 0915.  Dr. Orpah Melter to assume care per family request.  Please fax discharge summary to [phone].  RN, please call report to [phone].  Please arrange transport for patient to arrive before noon if possible.    Thank you for this referral.  Edyth Gunnels, RN, [facility] Liaison [phone]  All hospital liaisons are now on [place].

## 2017-05-01 NOTE — Progress Notes (Signed)
Palliative Medicine RN Note: Symptom check. Pt denies pain, nausea, SOB. Oriented to self only. Her speech is not appropriate to the conversation, and she is completely unaware of her situation. Her mental status has declined significantly over the past two days. Plan is for transfer to [facility] later today.  Marjie Skiff Brandie Lopes, RN, BSN, [facility] 05/01/2017 12:00 PM Office [phone]

## 2017-05-05 DEATH — deceased

## 2017-05-06 ENCOUNTER — Other Ambulatory Visit: Payer: Self-pay | Admitting: *Deleted

## 2017-05-06 NOTE — Patient Outreach (Signed)
[facility]) Care Management  05/06/2017  Angel Estrada 08/31/1933 [id number]   Patient was on [place] list for facility.  Met with Cameron Ali, SW at facility.  She reports patient discharged to hospital and then went to hospice home.   Plan to sign off as patient under hospice care, no [facility] care management needs at this time.  Royetta Crochet. Laymond Purser, RN, BSN, [place] [phone]) Business Cell  [phone]) Toll Free Office

## 2018-02-18 IMAGING — DX DG CHEST 1V PORT
1 series · 1 of 1 positions shown · non-contrast
Comparison: Chest x-ray 04/11/2017.  CT 04/08/2017 .

CLINICAL DATA: Altered mental status.

EXAM:
PORTABLE CHEST 1 VIEW

[chest ap]
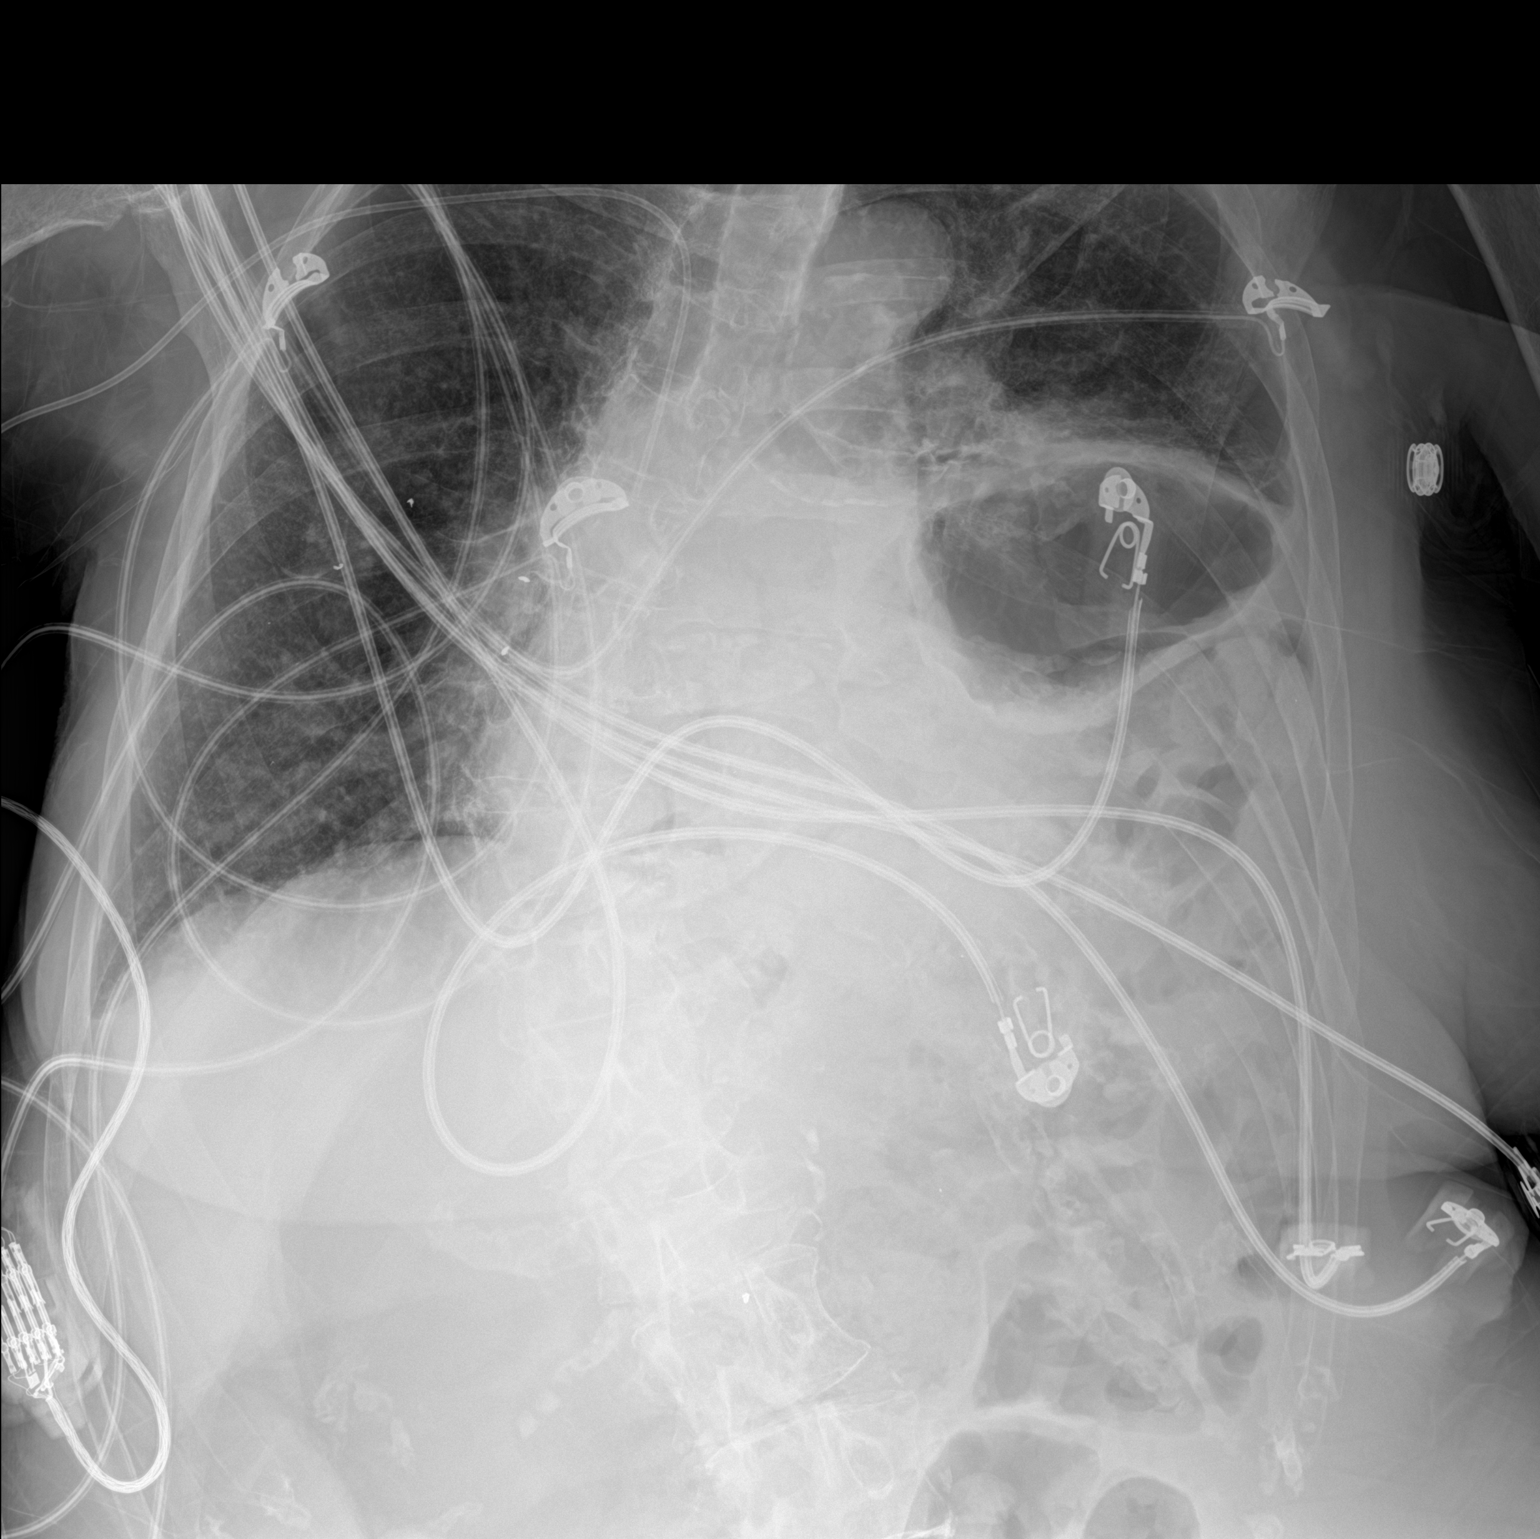

[1 of 1 positions shown; findings below may reference images not displayed]

FINDINGS: Right PICC line in stable position. Mediastinum hilar structures are
normal. Heart size stable. Stable elevation left hemidiaphragm with
stable left basilar atelectasis. Stable mild right base atelectasis.
Thoracolumbar spine scoliosis. Air-filled scratched it
air-containing structures over the left upper abdomen most likely
represent stomach and colon. reference is made to prior CT report of
04/08/2017 .
IMPRESSION: 1. Right PICC line stable position.

2. Stable elevation left hemidiaphragm with stable atelectasis left
base. Stable mild right base subsegmental atelectasis.
